# Patient Record
Sex: Female | Born: 1940
Health system: Southern US, Community
[De-identification: ages and names within clinical notes are randomized; demographics above are authoritative.]

## PROBLEM LIST (undated history)

## (undated) DIAGNOSIS — I10 Essential (primary) hypertension: Secondary | ICD-10-CM

## (undated) DIAGNOSIS — K219 Gastro-esophageal reflux disease without esophagitis: Secondary | ICD-10-CM

## (undated) DIAGNOSIS — E119 Type 2 diabetes mellitus without complications: Secondary | ICD-10-CM

## (undated) DIAGNOSIS — R413 Other amnesia: Secondary | ICD-10-CM

## (undated) DIAGNOSIS — D259 Leiomyoma of uterus, unspecified: Secondary | ICD-10-CM

## (undated) DIAGNOSIS — E559 Vitamin D deficiency, unspecified: Secondary | ICD-10-CM

## (undated) HISTORY — PX: TUBAL LIGATION: SHX77

## (undated) HISTORY — PX: HYSTEROSCOPY: SHX211

## (undated) HISTORY — DX: Vitamin D deficiency, unspecified: E55.9

## (undated) HISTORY — PX: CERVIX LESION DESTRUCTION: SHX591

## (undated) HISTORY — PX: DILATION AND CURETTAGE OF UTERUS: SHX78

## (undated) HISTORY — DX: Leiomyoma of uterus, unspecified: D25.9

---

## 1998-05-17 ENCOUNTER — Ambulatory Visit (HOSPITAL_COMMUNITY): Admission: RE | Admit: 1998-05-17 | Discharge: 1998-05-17 | Payer: Self-pay | Admitting: Internal Medicine

## 1998-08-29 ENCOUNTER — Ambulatory Visit (HOSPITAL_COMMUNITY): Admission: RE | Admit: 1998-08-29 | Discharge: 1998-08-29 | Payer: Self-pay | Admitting: Obstetrics and Gynecology

## 1999-03-30 ENCOUNTER — Ambulatory Visit (HOSPITAL_COMMUNITY): Admission: RE | Admit: 1999-03-30 | Discharge: 1999-03-30 | Payer: Self-pay | Admitting: Internal Medicine

## 1999-03-30 ENCOUNTER — Encounter: Payer: Self-pay | Admitting: Internal Medicine

## 1999-09-05 ENCOUNTER — Ambulatory Visit (HOSPITAL_COMMUNITY): Admission: RE | Admit: 1999-09-05 | Discharge: 1999-09-05 | Payer: Self-pay | Admitting: Obstetrics and Gynecology

## 1999-09-05 ENCOUNTER — Encounter: Payer: Self-pay | Admitting: Obstetrics and Gynecology

## 2000-03-28 ENCOUNTER — Other Ambulatory Visit: Admission: RE | Admit: 2000-03-28 | Discharge: 2000-03-28 | Payer: Self-pay | Admitting: Obstetrics and Gynecology

## 2000-09-06 ENCOUNTER — Ambulatory Visit (HOSPITAL_COMMUNITY): Admission: RE | Admit: 2000-09-06 | Discharge: 2000-09-06 | Payer: Self-pay | Admitting: Obstetrics and Gynecology

## 2000-09-06 ENCOUNTER — Encounter: Payer: Self-pay | Admitting: Obstetrics and Gynecology

## 2001-09-09 ENCOUNTER — Ambulatory Visit (HOSPITAL_COMMUNITY): Admission: RE | Admit: 2001-09-09 | Discharge: 2001-09-09 | Payer: Self-pay | Admitting: Obstetrics and Gynecology

## 2001-09-09 ENCOUNTER — Encounter: Payer: Self-pay | Admitting: Obstetrics and Gynecology

## 2002-04-20 ENCOUNTER — Other Ambulatory Visit: Admission: RE | Admit: 2002-04-20 | Discharge: 2002-04-20 | Payer: Self-pay | Admitting: Obstetrics and Gynecology

## 2003-05-12 ENCOUNTER — Other Ambulatory Visit: Admission: RE | Admit: 2003-05-12 | Discharge: 2003-05-12 | Payer: Self-pay | Admitting: Obstetrics and Gynecology

## 2003-11-20 HISTORY — PX: UTERINE FIBROID SURGERY: SHX826

## 2003-11-20 HISTORY — PX: COLONOSCOPY: SHX174

## 2004-01-14 ENCOUNTER — Ambulatory Visit (HOSPITAL_COMMUNITY): Admission: RE | Admit: 2004-01-14 | Discharge: 2004-01-14 | Payer: Self-pay | Admitting: Gastroenterology

## 2004-06-05 ENCOUNTER — Ambulatory Visit (HOSPITAL_COMMUNITY): Admission: RE | Admit: 2004-06-05 | Discharge: 2004-06-05 | Payer: Self-pay | Admitting: Obstetrics and Gynecology

## 2004-06-05 ENCOUNTER — Encounter (INDEPENDENT_AMBULATORY_CARE_PROVIDER_SITE_OTHER): Payer: Self-pay | Admitting: Specialist

## 2008-04-21 ENCOUNTER — Ambulatory Visit (HOSPITAL_COMMUNITY): Admission: RE | Admit: 2008-04-21 | Discharge: 2008-04-21 | Payer: Self-pay | Admitting: Internal Medicine

## 2008-11-19 HISTORY — PX: KNEE ARTHROCENTESIS: SUR44

## 2010-05-08 ENCOUNTER — Inpatient Hospital Stay (HOSPITAL_COMMUNITY): Admission: RE | Admit: 2010-05-08 | Discharge: 2010-05-11 | Payer: Self-pay | Admitting: Orthopedic Surgery

## 2011-02-04 LAB — CBC
HCT: 21.4 % — ABNORMAL LOW (ref 36.0–46.0)
HCT: 27.5 % — ABNORMAL LOW (ref 36.0–46.0)
HCT: 28.9 % — ABNORMAL LOW (ref 36.0–46.0)
Hemoglobin: 8.9 g/dL — ABNORMAL LOW (ref 12.0–15.0)
Hemoglobin: 9.5 g/dL — ABNORMAL LOW (ref 12.0–15.0)
MCHC: 32.6 g/dL (ref 30.0–36.0)
MCHC: 32.8 g/dL (ref 30.0–36.0)
MCHC: 32.9 g/dL (ref 30.0–36.0)
MCV: 93.5 fL (ref 78.0–100.0)
MCV: 93.6 fL (ref 78.0–100.0)
RBC: 2.29 MIL/uL — ABNORMAL LOW (ref 3.87–5.11)
RBC: 2.94 MIL/uL — ABNORMAL LOW (ref 3.87–5.11)
RBC: 3.08 MIL/uL — ABNORMAL LOW (ref 3.87–5.11)
RDW: 13.5 % (ref 11.5–15.5)
RDW: 13.9 % (ref 11.5–15.5)
WBC: 9.4 10*3/uL (ref 4.0–10.5)

## 2011-02-04 LAB — BASIC METABOLIC PANEL
CO2: 24 mEq/L (ref 19–32)
CO2: 28 mEq/L (ref 19–32)
Calcium: 8.6 mg/dL (ref 8.4–10.5)
Chloride: 106 mEq/L (ref 96–112)
Creatinine, Ser: 0.86 mg/dL (ref 0.4–1.2)
GFR calc Af Amer: >60 mL/min (ref >60)
GFR calc Af Amer: >60 mL/min (ref >60)
GFR calc non Af Amer: >60 mL/min (ref >60)
Glucose, Bld: 205 mg/dL — ABNORMAL HIGH (ref 70–99)
Potassium: 4.7 mEq/L (ref 3.5–5.1)
Potassium: 4.7 mEq/L (ref 3.5–5.1)
Sodium: 135 mEq/L (ref 135–145)

## 2011-02-04 LAB — TYPE AND SCREEN

## 2011-02-04 LAB — PROTIME-INR
INR: 1.19 (ref 0.00–1.49)
INR: 1.72 — ABNORMAL HIGH (ref 0.00–1.49)

## 2011-02-05 LAB — COMPREHENSIVE METABOLIC PANEL
Alkaline Phosphatase: 78 U/L (ref 39–117)
BUN: 13 mg/dL (ref 6–23)
Creatinine, Ser: 0.82 mg/dL (ref 0.4–1.2)
Glucose, Bld: 103 mg/dL — ABNORMAL HIGH (ref 70–99)
Potassium: 4 mEq/L (ref 3.5–5.1)
Total Protein: 7.5 g/dL (ref 6.0–8.3)

## 2011-02-05 LAB — URINALYSIS, ROUTINE W REFLEX MICROSCOPIC
Bilirubin Urine: NEGATIVE
Hgb urine dipstick: NEGATIVE
Nitrite: NEGATIVE
Protein, ur: NEGATIVE mg/dL
Urobilinogen, UA: 0.2 mg/dL (ref 0.0–1.0)

## 2011-02-05 LAB — SURGICAL PCR SCREEN: Staphylococcus aureus: POSITIVE — AB

## 2011-02-05 LAB — CBC
HCT: 36.9 % (ref 36.0–46.0)
Hemoglobin: 12.3 g/dL (ref 12.0–15.0)
MCHC: 33.4 g/dL (ref 30.0–36.0)
MCV: 93.8 fL (ref 78.0–100.0)
Platelets: 257 10*3/uL (ref 150–400)
RDW: 13.5 % (ref 11.5–15.5)

## 2011-02-05 LAB — PROTIME-INR
INR: 1.03 (ref 0.00–1.49)
Prothrombin Time: 13.4 seconds (ref 11.6–15.2)

## 2011-02-05 LAB — APTT: aPTT: 31 seconds (ref 24–37)

## 2011-04-06 NOTE — H&P (Signed)
NAME:  Martha Mata, Martha Mata                        ACCOUNT NO.:  [id number]   MEDICAL RECORD NO.:  [id number]                   PATIENT TYPE:  AMB   LOCATION:  SDC                                  FACILITY:  [facility]   PHYSICIAN:  Maxie Better, M.D.            DATE OF BIRTH:  1941-07-11   DATE OF ADMISSION:  06/05/2004  DATE OF DISCHARGE:                                HISTORY & PHYSICAL   CHIEF COMPLAINT:  Postmenopausal bleeding, thickened endometrium.   HISTORY OF PRESENT ILLNESS:  A 70 year old retired, widowed black female  with postmenopausal bleeding who was found to have a thickened endometrium  at an ultrasound who now presents with [facility] hysteroscopy.  The patient has  been noted to have a thickened endometrium since 1996.  Her last endometrial  biopsy was in 2001, at which time it was benign.  She was last evaluated  sonographically in 2001 when she presented vaginal bleeding.  Ultrasound on  May 24, 2004 showed an endometrial stripe of 1.27.  The endometrium was  cystic in hyperechoic areas with positive blood flow.  Multiple fibroids  were noted.  Ovaries were both normal.   The patient now presents for surgical evaluation.   ALLERGIES:  No known drug allergies.   MEDICATIONS:  Avalide.   PAST MEDICAL HISTORY:  Chronic hypertension.   PAST SURGICAL HISTORY:  Tubal ligation.   FAMILY HISTORY:  Mother with diabetes and hypertension.  No heart disease.  No colon or genital cancer.  Father died in a car accident.   OBSTETRICAL HISTORY:  Vaginal delivery x 2.  A widow, two children, and  nonsmoker.   REVIEW OF SYMPTOMS:  Negative.   PHYSICAL EXAMINATION:  GENERAL:  Well-developed, well-nourished black female  in no acute distress.  VITAL SIGNS:  Blood pressure 136/80, pulse of 72.  Weight of 197.5 pounds.  SKIN:  No lesions.  HEENT:  Anicteric sclerae.  Pink conjunctivae.  Oropharynx negative.  HEART:  Regular rate and rhythm without murmur.  LUNGS:  Clear to  auscultation.  BACK:  No CVA tenderness.  NECK:  Supple.  Thyroid not palpable.  No supraclavicular, axillary, or  inguinal nodes palpable.  ABDOMEN:  Obese, soft and nontender.  BREASTS:  Soft, nontender.  No palpable mass.  Right axillary extramammary  tissue.  EXTREMITIES:  No edema.  PELVIC:  Vulva showed no lesion.  Vaginal had light brown discharge.  Cervix  was pinpoint os.  Uterus was irregular, anteverted about eight weeks size  with a palpable anterior thyroid.  Adnexa:  No palpable but limited by the  patient's body habitus.  RECTAL:  Deferred.  The patient had a colonoscopy December of 2005.   IMPRESSION:  1. Postmenopausal bleeding.  2. Thickening endometrium.  3. Uterine fibroids.   PLAN:  D&C hysteroscopy and evaluation of the endometrium cavity,  preoperative labs per anesthesia.  Risk seizure was explained to the patient  including, but not  limited to, infection, bleeding, uterine perforation and  its management, fluid overload and its management and consequences, internal  scar tissue, injury to the surrounding organ structures, inability to dilate  the cervix completely due to her pinpoint os.                                               Maxie Better, M.D.    [place]/MEDQ  D:  06/04/2004  T:  06/04/2004  Job:  [id number]

## 2011-04-06 NOTE — Op Note (Signed)
NAME:  Martha Mata, Martha Mata                        ACCOUNT NO.:  [id number]   MEDICAL RECORD NO.:  [id number]                   PATIENT TYPE:  AMB   LOCATION:  SDC                                  FACILITY:  [facility]   PHYSICIAN:  Maxie Better, M.D.            DATE OF BIRTH:  Oct 20, 1941   DATE OF PROCEDURE:  06/05/2004  DATE OF DISCHARGE:                                 OPERATIVE REPORT   PREOPERATIVE DIAGNOSES:  1. Postmenopausal bleeding.  2. Endometrial thickness.   POSTOPERATIVE DIAGNOSES:  1. Postmenopausal bleeding.  2. Endometrial masses.   PROCEDURE:  Diagnostic hysteroscopy, removal of endometrial masses, dilation  and curettage.   ANESTHESIA:  General paracervical block.   SURGEON:  Maxie Better, M.D.   DESCRIPTION OF PROCEDURE:  Under adequate general anesthesia, the patient  was placed in the dorsal lithotomy position, she was sterilely prepped in  the usual fashion,  __________ small amount of urine.  Examination under  anesthesia revealed an irregular anteverted uterus about eight weeks size,  no adnexal masses could be appreciated. A bivalve speculum was placed within  the vagina, a single tooth tenaculum was placed on the anterior lip of the  cervix.  10 mL of 1% Nesacaine was injected paracervically, the cervix was  then serially dilated up to a #25 Pratt dilator.  A #5 mm diagnostic  hysteroscope was introduced into the uterine cavity. The left tubal ostia  could be seen. There was a polypoid lesion in the left lateral area of the  fundus. The right tubal ostia was sclerosed. There was an additional  polypoid lesion on the left anterior aspect of the uterine cavity and one on  the right distally to the other previous mass noted.  The cervix was  attempted to be further dilated up to a #31 dilator; however, the  resectoscope could not traverse the internal os and therefore polypoid  forceps and a small DeLee clamp was utilized to grasp the polypoid  lesions  and remove them with the diagnostic hysteroscope being reinserted to check  as to whether or not the lesions have been removed.  This was performed  until all polypoid lesions were removed at which time the cavity was then  curetted. There were small submucosal fibroids seen anteriorly and  posteriorly. These were not felt to be needed to be resected or removed as  the patient is postmenopausal. When all tissue was felt to have been removed  that was pedunculated, all instruments were then removed from the vagina.  The specimen labeled endometrial curetting, endometrial mass was sent to  pathology. Estimated blood loss was minimal. Complications none.  The  patient tolerated the procedure well and was transferred to the recovery  room in stable condition.  Maxie Better, M.D.    [place]/MEDQ  D:  06/05/2004  T:  06/06/2004  Job:  [id number]

## 2011-04-06 NOTE — Op Note (Signed)
NAME:  Martha Mata, Martha Mata                        ACCOUNT NO.:  [id number]   MEDICAL RECORD NO.:  [id number]                   PATIENT TYPE:  AMB   LOCATION:  ENDO                                 FACILITY:  [facility]   PHYSICIAN:  James L. Malon Kindle., M.D.          DATE OF BIRTH:  May 17, 1941   DATE OF PROCEDURE:  01/14/2004  DATE OF DISCHARGE:                                 OPERATIVE REPORT   PROCEDURE:  Colonoscopy.   MEDICATIONS:  Fentanyl 50 mcg, Versed 8 mg IV.   INDICATIONS:  Screening colonoscopy.   DESCRIPTION OF PROCEDURE:  The procedure had been explained to the patient  and consent obtained.  With the patient in the left lateral decubitus  position, the Olympus scope was inserted and advanced.  The prep was  excellent.  We were able to advance easily to the cecum.  The ileocecal  valve and appendiceal orifice were seen.  The  scope was withdrawn and the  cecum, ascending colon, transverse colon, descending and sigmoid colon were  seen well.  No polyps or other lesions were seen.  No significant  diverticular disease.  The rectum was free of polyps.  The scope was  withdrawn.  The patient tolerated the procedure well.  She was maintained on  low-flow oxygen and monitored throughout.   ASSESSMENT:  Normal screening colonoscopy.  V76.51.   PLAN:  Will recommend yearly hemoccults and repeat procedure in 10 years.                                               James L. Malon Kindle., M.D.    Waldron Session  D:  01/14/2004  T:  01/14/2004  Job:  [id number]   cc:   Margaretmary Bayley, M.D.  [address]  [place]  [place] [address]  Fax: [phone]   Maxie Better, M.D.  [address]  [place]  [place] [address]  Fax: [phone]

## 2011-09-10 ENCOUNTER — Emergency Department (HOSPITAL_COMMUNITY)
Admission: EM | Admit: 2011-09-10 | Discharge: 2011-09-10 | Disposition: A | Discharge status: Home / Self Care | Payer: Medicare Other | Attending: Emergency Medicine | Admitting: Emergency Medicine

## 2011-09-10 ENCOUNTER — Emergency Department (HOSPITAL_COMMUNITY): Payer: Medicare Other

## 2011-09-10 DIAGNOSIS — R51 Headache: Secondary | ICD-10-CM | POA: Insufficient documentation

## 2011-09-10 DIAGNOSIS — I10 Essential (primary) hypertension: Secondary | ICD-10-CM | POA: Insufficient documentation

## 2011-09-10 DIAGNOSIS — K8050 Calculus of bile duct without cholangitis or cholecystitis without obstruction: Secondary | ICD-10-CM | POA: Insufficient documentation

## 2011-09-10 DIAGNOSIS — R42 Dizziness and giddiness: Secondary | ICD-10-CM | POA: Insufficient documentation

## 2011-09-10 DIAGNOSIS — Z7982 Long term (current) use of aspirin: Secondary | ICD-10-CM | POA: Insufficient documentation

## 2011-09-10 DIAGNOSIS — R079 Chest pain, unspecified: Secondary | ICD-10-CM | POA: Insufficient documentation

## 2011-09-10 DIAGNOSIS — Z79899 Other long term (current) drug therapy: Secondary | ICD-10-CM | POA: Insufficient documentation

## 2011-09-10 DIAGNOSIS — R1013 Epigastric pain: Secondary | ICD-10-CM | POA: Insufficient documentation

## 2011-09-10 DIAGNOSIS — R911 Solitary pulmonary nodule: Secondary | ICD-10-CM | POA: Insufficient documentation

## 2011-09-10 DIAGNOSIS — Z96659 Presence of unspecified artificial knee joint: Secondary | ICD-10-CM | POA: Insufficient documentation

## 2011-09-10 LAB — COMPREHENSIVE METABOLIC PANEL
Albumin: 4.1 g/dL (ref 3.5–5.2)
BUN: 13 mg/dL (ref 6–23)
Creatinine, Ser: 0.78 mg/dL (ref 0.50–1.10)
Total Bilirubin: 0.3 mg/dL (ref 0.3–1.2)
Total Protein: 8.5 g/dL — ABNORMAL HIGH (ref 6.0–8.3)

## 2011-09-10 LAB — POCT I-STAT, CHEM 8
BUN: 14 mg/dL (ref 6–23)
Calcium, Ion: 1.19 mmol/L (ref 1.12–1.32)
Chloride: 104 mEq/L (ref 96–112)
Glucose, Bld: 152 mg/dL — ABNORMAL HIGH (ref 70–99)

## 2011-09-10 LAB — CBC
MCH: 30.4 pg (ref 26.0–34.0)
MCHC: 33.9 g/dL (ref 30.0–36.0)
MCV: 89.9 fL (ref 78.0–100.0)
Platelets: 288 10*3/uL (ref 150–400)
RDW: 13.2 % (ref 11.5–15.5)

## 2011-09-10 LAB — DIFFERENTIAL
Eosinophils Absolute: 0 10*3/uL (ref 0.0–0.7)
Eosinophils Relative: 0 % (ref 0–5)
Lymphs Abs: 1.1 10*3/uL (ref 0.7–4.0)
Monocytes Relative: 4 % (ref 3–12)

## 2011-09-10 LAB — TROPONIN I: Troponin I: <0.3 ng/mL (ref <0.30)

## 2011-09-10 LAB — LIPASE, BLOOD: Lipase: 31 U/L (ref 11–59)

## 2011-09-19 ENCOUNTER — Other Ambulatory Visit: Payer: Self-pay | Admitting: Internal Medicine

## 2011-09-19 DIAGNOSIS — R9389 Abnormal findings on diagnostic imaging of other specified body structures: Secondary | ICD-10-CM

## 2011-09-21 ENCOUNTER — Ambulatory Visit (HOSPITAL_COMMUNITY)
Admission: RE | Admit: 2011-09-21 | Discharge: 2011-09-21 | Disposition: A | Discharge status: Home / Self Care | Payer: Medicare Other | Source: Ambulatory Visit | Attending: Internal Medicine | Admitting: Internal Medicine

## 2011-09-21 DIAGNOSIS — R9389 Abnormal findings on diagnostic imaging of other specified body structures: Secondary | ICD-10-CM

## 2011-09-21 DIAGNOSIS — K449 Diaphragmatic hernia without obstruction or gangrene: Secondary | ICD-10-CM | POA: Insufficient documentation

## 2011-09-21 DIAGNOSIS — R911 Solitary pulmonary nodule: Secondary | ICD-10-CM | POA: Insufficient documentation

## 2011-09-21 MED ORDER — IOHEXOL 300 MG/ML  SOLN
80.0000 mL | Freq: Once | INTRAMUSCULAR | Status: AC | PRN
  Administered 2011-09-21: 80 mL via INTRAVENOUS

## 2013-05-21 ENCOUNTER — Other Ambulatory Visit: Payer: Self-pay | Admitting: Internal Medicine

## 2013-05-21 ENCOUNTER — Ambulatory Visit (HOSPITAL_COMMUNITY)
Admission: RE | Admit: 2013-05-21 | Discharge: 2013-05-21 | Disposition: A | Discharge status: Home / Self Care | Payer: Medicare Other | Source: Ambulatory Visit | Attending: Internal Medicine | Admitting: Internal Medicine

## 2013-05-21 DIAGNOSIS — R52 Pain, unspecified: Secondary | ICD-10-CM

## 2013-05-21 DIAGNOSIS — M25519 Pain in unspecified shoulder: Secondary | ICD-10-CM | POA: Insufficient documentation

## 2013-06-26 ENCOUNTER — Emergency Department (HOSPITAL_COMMUNITY): Payer: Medicare Other

## 2013-06-26 ENCOUNTER — Emergency Department (HOSPITAL_COMMUNITY)
Admission: EM | Admit: 2013-06-26 | Discharge: 2013-06-26 | Disposition: A | Discharge status: Home / Self Care | Payer: Medicare Other | Attending: Emergency Medicine | Admitting: Emergency Medicine

## 2013-06-26 ENCOUNTER — Encounter (HOSPITAL_COMMUNITY): Payer: Self-pay | Admitting: Emergency Medicine

## 2013-06-26 DIAGNOSIS — N939 Abnormal uterine and vaginal bleeding, unspecified: Secondary | ICD-10-CM

## 2013-06-26 DIAGNOSIS — N898 Other specified noninflammatory disorders of vagina: Secondary | ICD-10-CM | POA: Insufficient documentation

## 2013-06-26 DIAGNOSIS — I10 Essential (primary) hypertension: Secondary | ICD-10-CM | POA: Insufficient documentation

## 2013-06-26 DIAGNOSIS — M545 Low back pain, unspecified: Secondary | ICD-10-CM | POA: Insufficient documentation

## 2013-06-26 DIAGNOSIS — Z79899 Other long term (current) drug therapy: Secondary | ICD-10-CM | POA: Insufficient documentation

## 2013-06-26 DIAGNOSIS — D259 Leiomyoma of uterus, unspecified: Secondary | ICD-10-CM

## 2013-06-26 HISTORY — DX: Essential (primary) hypertension: I10

## 2013-06-26 LAB — COMPREHENSIVE METABOLIC PANEL
AST: 14 U/L (ref 0–37)
Albumin: 3.8 g/dL (ref 3.5–5.2)
Alkaline Phosphatase: 70 U/L (ref 39–117)
CO2: 28 mEq/L (ref 19–32)
Chloride: 101 mEq/L (ref 96–112)
GFR calc non Af Amer: 64 mL/min — ABNORMAL LOW (ref >90)
Potassium: 4 mEq/L (ref 3.5–5.1)
Total Bilirubin: 0.4 mg/dL (ref 0.3–1.2)

## 2013-06-26 LAB — URINALYSIS, ROUTINE W REFLEX MICROSCOPIC
Bilirubin Urine: NEGATIVE
Glucose, UA: NEGATIVE mg/dL
Hgb urine dipstick: NEGATIVE
Ketones, ur: 15 mg/dL — AB
Ketones, ur: NEGATIVE mg/dL
Nitrite: POSITIVE — AB
Protein, ur: NEGATIVE mg/dL
Specific Gravity, Urine: 1.009 (ref 1.005–1.030)
Urobilinogen, UA: 0.2 mg/dL (ref 0.0–1.0)
pH: 7 (ref 5.0–8.0)

## 2013-06-26 LAB — CBC WITH DIFFERENTIAL/PLATELET
Basophils Absolute: 0 10*3/uL (ref 0.0–0.1)
Basophils Relative: 0 % (ref 0–1)
HCT: 37.5 % (ref 36.0–46.0)
Hemoglobin: 12.5 g/dL (ref 12.0–15.0)
Lymphocytes Relative: 28 % (ref 12–46)
MCHC: 33.3 g/dL (ref 30.0–36.0)
Monocytes Absolute: 0.5 10*3/uL (ref 0.1–1.0)
Monocytes Relative: 9 % (ref 3–12)
Neutro Abs: 3.5 10*3/uL (ref 1.7–7.7)
Neutrophils Relative %: 62 % (ref 43–77)
RDW: 14 % (ref 11.5–15.5)
WBC: 5.6 10*3/uL (ref 4.0–10.5)

## 2013-06-26 LAB — URINE MICROSCOPIC-ADD ON

## 2013-06-26 MED ORDER — IOHEXOL 300 MG/ML  SOLN
100.0000 mL | Freq: Once | INTRAMUSCULAR | Status: AC | PRN
  Administered 2013-06-26: 100 mL via INTRAVENOUS

## 2013-06-26 MED ORDER — IOHEXOL 300 MG/ML  SOLN
50.0000 mL | Freq: Once | INTRAMUSCULAR | Status: AC | PRN
  Administered 2013-06-26: 50 mL via ORAL

## 2013-06-26 NOTE — ED Provider Notes (Signed)
Medical screening examination/treatment/procedure(s) were conducted as a shared visit with non-physician practitioner(s) and myself.  I personally evaluated the patient during the encounter  Martha Mata is a 72 y.o. female here with vaginal bleeding. Vaginal bleeding since yesterday. Post menopausal. Mild LLQ tenderness on exam. Vaginal exam as per PA's note. CT showed fibroids. CBC stable. She has gyn f/u next week. I told her to take motrin prn. If the bleeding is worse she may need a hysterectomy.    Richardean Canal, MD 06/26/13 2223

## 2013-06-26 NOTE — Progress Notes (Signed)
Patient states her pcp is Dr. Chestine Spore.

## 2013-06-26 NOTE — ED Notes (Signed)
This am pt began having lt flank pain with some vaginal bleeding. Denies any pain. Alert x4,

## 2013-06-26 NOTE — ED Provider Notes (Signed)
CSN: [id number]     Arrival date & time 06/26/13  1605 History     First MD Initiated Contact with Patient 06/26/13 1706     Chief Complaint  Patient presents with  . Vaginal Bleeding   (Consider location/radiation/quality/duration/timing/severity/associated sxs/prior Treatment) HPI Comments: 72 year old female past medical history of hypertension presents emergency department complaining of vaginal bleeding beginning this morning. States the blood is not clots, however is bleeding enough to require her to wear pads. She has not had any vaginal bleeding for "many many years". She still has her uterus and ovaries. Denies vaginal pain, pelvic pain, flank pain. Denies increased urinary frequency, urgency or dysuria. No appetite change or unexpected weight change. She does not take any blood thinners. She does have some mild left-sided low back pain which has been present for the past week, however states she's been doing a lot around the house and relates it to that.  Patient is a 72 y.o. female presenting with vaginal bleeding. The history is provided by the patient.  Vaginal Bleeding Associated symptoms: back pain   Associated symptoms: no abdominal pain, no dysuria, no fever, no nausea and no vaginal discharge     Past Medical History  Diagnosis Date  . Hypertension    Past Surgical History  Procedure Laterality Date  . Knee arthrocentesis     No family history on file. History  Substance Use Topics  . Smoking status: Not on file  . Smokeless tobacco: Not on file  . Alcohol Use: Not on file   OB History   Grav Para Term Preterm Abortions TAB SAB Ect Mult Living                 Review of Systems  Constitutional: Negative for fever, chills, appetite change and unexpected weight change.  Respiratory: Negative for shortness of breath.   Cardiovascular: Negative for chest pain.  Gastrointestinal: Negative for nausea, vomiting and abdominal pain.  Genitourinary: Positive for  vaginal bleeding. Negative for dysuria, urgency, frequency, hematuria, flank pain, decreased urine volume, vaginal discharge, difficulty urinating, vaginal pain and pelvic pain.  Musculoskeletal: Positive for back pain.  Neurological: Negative for weakness.  All other systems reviewed and are negative.    Allergies  Review of patient's allergies indicates no known allergies.  Home Medications   Current Outpatient Rx  Name  Route  Sig  Dispense  Refill  . amLODipine (NORVASC) 10 MG tablet   Oral   Take 10 mg by mouth daily.         . benazepril (LOTENSIN) 20 MG tablet   Oral   Take 20 mg by mouth daily.         . cholecalciferol (VITAMIN D) 1000 UNITS tablet   Oral   Take 1,000 Units by mouth daily.         . vitamin C (ASCORBIC ACID) 500 MG tablet   Oral   Take 500 mg by mouth daily.         . vitamin E 400 UNIT capsule   Oral   Take 400 Units by mouth daily.          BP 161/74  Pulse 78  Temp(Src) 97.3 F (36.3 C) (Oral)  Resp 18  SpO2 99% Physical Exam  Nursing note and vitals reviewed. Constitutional: She is oriented to person, place, and time. She appears well-developed and well-nourished. No distress.  HENT:  Head: Normocephalic and atraumatic.  Mouth/Throat: Oropharynx is clear and moist.  Eyes: Conjunctivae are  normal.  Neck: Normal range of motion. Neck supple.  Cardiovascular: Normal rate, regular rhythm and normal heart sounds.   Pulmonary/Chest: Effort normal and breath sounds normal.  Abdominal: Soft. Normal appearance and bowel sounds are normal. She exhibits no distension and no mass. There is tenderness (deep palpation) in the left lower quadrant. There is no rigidity, no rebound, no guarding and no CVA tenderness.    No peritoneal signs.  Genitourinary: Uterus is not tender. Right adnexum displays no mass, no tenderness and no fullness. Left adnexum displays tenderness. Left adnexum displays no mass and no fullness. There is bleeding  (large amount of dark red blood) around the vagina. No tenderness around the vagina.  Musculoskeletal: Normal range of motion. She exhibits no edema.  Neurological: She is alert and oriented to person, place, and time.  Skin: Skin is warm and dry. She is not diaphoretic.  Psychiatric: She has a normal mood and affect. Her behavior is normal.    ED Course   Procedures (including critical care time)  Labs Reviewed  URINALYSIS, ROUTINE W REFLEX MICROSCOPIC - Abnormal; Notable for the following:    Color, Urine RED (*)    APPearance TURBID (*)    Hgb urine dipstick LARGE (*)    Bilirubin Urine MODERATE (*)    Ketones, ur 15 (*)    Protein, ur 100 (*)    Nitrite POSITIVE (*)    Leukocytes, UA LARGE (*)    All other components within normal limits  COMPREHENSIVE METABOLIC PANEL - Abnormal; Notable for the following:    GFR calc non Af Amer 64 (*)    GFR calc Af Amer 74 (*)    All other components within normal limits  URINE MICROSCOPIC-ADD ON - Abnormal; Notable for the following:    Bacteria, UA MANY (*)    All other components within normal limits  URINALYSIS, ROUTINE W REFLEX MICROSCOPIC - Abnormal; Notable for the following:    Leukocytes, UA TRACE (*)    All other components within normal limits  URINE CULTURE  CBC WITH DIFFERENTIAL  URINE MICROSCOPIC-ADD ON   Ct Abdomen Pelvis W Contrast  06/26/2013   *RADIOLOGY REPORT*  Clinical Data: Left flank pain and vaginal bleeding.  CT ABDOMEN AND PELVIS WITH CONTRAST  Technique:  Multidetector CT imaging of the abdomen and pelvis was performed following the standard protocol during bolus administration of intravenous contrast.  Contrast: OMNIPAQUE IOHEXOL 300 MG/ML  SOLN, 50mL OMNIPAQUE IOHEXOL 300 MG/ML  SOLN  Comparison: None.  Findings: Diffuse hepatic steatosis. Relative sparing adjacent to the gallbladder in the medial segment of the left lobe.  2.7 cm gallstone.  Large hiatal hernia.  Pancreas, spleen, adrenal glands are  within normal limits.  Mild chronic changes of the kidneys.  Tiny hypodensity in the upper pole of the left kidney is nonspecific.  The uterus is lobulated most likely due to multiple uterine fibroids.  Adnexa are unremarkable.  Bladder is within normal limits.  No free fluid.  No retroperitoneal adenopathy.  Advanced lumbar facet arthropathy.  An element of spinal stenosis is suspected in the lumbar spine.  IMPRESSION: Cholelithiasis.  Diffuse hepatic steatosis.  Uterine fibroids.   Original Report Authenticated By: Jolaine Click, M.D.   1. Vaginal bleeding   2. Abnormal uterine bleeding   3. Uterine fibroid     MDM  Patient with vaginal bleeding x1 day. No other significant symptoms present. Tenderness in left lower cushion on abdominal exam to deep palpation. Concern for possible  malignancy. Labs pending- CBC, CMP and urine. We'll obtain CT scan of abdomen and pelvis. Patient discussed with Dr. Silverio Lay who agrees with plan of care. 8:47 PM CT scan showing uterine fibroids. Labs unremarkable. Initial urine sample contaminated, repeat obtained through in and out cath and normal. She has f/u appt with her GYN Dr. Cherly Hensen on Wed. She is stable for discharge. Close return precautions discussed. Patient states understanding of treatment care plan and is agreeable. Patient also evaluated by Dr. Silverio Lay who agrees with plan of care.  Trevor Mace, PA-C 06/26/13 2049

## 2013-06-27 LAB — URINE CULTURE: Colony Count: 45000

## 2013-07-16 ENCOUNTER — Other Ambulatory Visit: Payer: Self-pay | Admitting: Obstetrics and Gynecology

## 2013-07-17 ENCOUNTER — Encounter (HOSPITAL_COMMUNITY): Payer: Self-pay

## 2013-07-17 ENCOUNTER — Encounter (HOSPITAL_COMMUNITY): Payer: Self-pay | Admitting: Pharmacy Technician

## 2013-07-17 ENCOUNTER — Encounter (HOSPITAL_COMMUNITY)
Admission: RE | Admit: 2013-07-17 | Discharge: 2013-07-17 | Disposition: A | Discharge status: Home / Self Care | Payer: Medicare Other | Source: Ambulatory Visit | Attending: Obstetrics and Gynecology | Admitting: Obstetrics and Gynecology

## 2013-07-17 DIAGNOSIS — Z01810 Encounter for preprocedural cardiovascular examination: Secondary | ICD-10-CM | POA: Insufficient documentation

## 2013-07-17 DIAGNOSIS — Z01812 Encounter for preprocedural laboratory examination: Secondary | ICD-10-CM | POA: Insufficient documentation

## 2013-07-17 DIAGNOSIS — Z01818 Encounter for other preprocedural examination: Secondary | ICD-10-CM | POA: Insufficient documentation

## 2013-07-17 HISTORY — DX: Gastro-esophageal reflux disease without esophagitis: K21.9

## 2013-07-17 HISTORY — DX: Other amnesia: R41.3

## 2013-07-17 LAB — CBC
HCT: 35.9 % — ABNORMAL LOW (ref 36.0–46.0)
MCV: 92.8 fL (ref 78.0–100.0)
RDW: 13.7 % (ref 11.5–15.5)
WBC: 5 10*3/uL (ref 4.0–10.5)

## 2013-07-17 LAB — BASIC METABOLIC PANEL
BUN: 14 mg/dL (ref 6–23)
Chloride: 104 mEq/L (ref 96–112)
Creatinine, Ser: 0.9 mg/dL (ref 0.50–1.10)
GFR calc Af Amer: 72 mL/min — ABNORMAL LOW (ref >90)
Glucose, Bld: 100 mg/dL — ABNORMAL HIGH (ref 70–99)

## 2013-07-17 NOTE — Patient Instructions (Addendum)
Your procedure is scheduled on:07/22/13  Enter through the Main Entrance at :6am Pick up desk phone and dial [phone] and inform us of your arrival.  Please call [phone] if you have any problems the morning of surgery.  Remember: Do not eat food or drink liquids, including water, after midnight:TUESDAY   You may brush your teeth the morning of surgery.  Take these meds the morning of surgery with a sip of water:blood pressure pills  DO NOT wear jewelry, eye make-up, lipstick,body lotion, or dark fingernail polish.  (Polished toes are ok) You may wear deodorant.   Patients discharged on the day of surgery will not be allowed to drive home. Wear loose fitting, comfortable clothes for your ride home.

## 2013-07-22 ENCOUNTER — Encounter (HOSPITAL_COMMUNITY): Payer: Self-pay | Admitting: Anesthesiology

## 2013-07-22 ENCOUNTER — Ambulatory Visit (HOSPITAL_COMMUNITY)
Admission: RE | Admit: 2013-07-22 | Discharge: 2013-07-22 | Disposition: A | Discharge status: Home / Self Care | Payer: Medicare Other | Source: Ambulatory Visit | Attending: Obstetrics and Gynecology | Admitting: Obstetrics and Gynecology

## 2013-07-22 ENCOUNTER — Encounter (HOSPITAL_COMMUNITY): Payer: Self-pay | Admitting: *Deleted

## 2013-07-22 ENCOUNTER — Encounter (HOSPITAL_COMMUNITY)
Admission: RE | Disposition: A | Discharge status: Home / Self Care | Payer: Self-pay | Source: Ambulatory Visit | Attending: Obstetrics and Gynecology

## 2013-07-22 DIAGNOSIS — N840 Polyp of corpus uteri: Secondary | ICD-10-CM | POA: Insufficient documentation

## 2013-07-22 DIAGNOSIS — Z5309 Procedure and treatment not carried out because of other contraindication: Secondary | ICD-10-CM | POA: Insufficient documentation

## 2013-07-22 DIAGNOSIS — N950 Postmenopausal bleeding: Secondary | ICD-10-CM | POA: Insufficient documentation

## 2013-07-22 SURGERY — DILATATION & CURETTAGE/HYSTEROSCOPY WITH RESECTOCOPE
Anesthesia: Choice

## 2013-07-22 MED ORDER — LIDOCAINE HCL (CARDIAC) 20 MG/ML IV SOLN
INTRAVENOUS | Status: AC
  Filled 2013-07-22: qty 5

## 2013-07-22 MED ORDER — FENTANYL CITRATE 0.05 MG/ML IJ SOLN
INTRAMUSCULAR | Status: AC
  Filled 2013-07-22: qty 2

## 2013-07-22 MED ORDER — CHLOROPROCAINE HCL 1 % IJ SOLN
INTRAMUSCULAR | Status: AC
  Filled 2013-07-22: qty 30

## 2013-07-22 MED ORDER — MIDAZOLAM HCL 2 MG/2ML IJ SOLN
INTRAMUSCULAR | Status: AC
  Filled 2013-07-22: qty 2

## 2013-07-22 MED ORDER — LACTATED RINGERS IV SOLN
INTRAVENOUS | Status: DC
  Administered 2013-07-22: 07:00:00 via INTRAVENOUS

## 2013-07-22 MED ORDER — PROPOFOL 10 MG/ML IV EMUL
INTRAVENOUS | Status: AC
  Filled 2013-07-22: qty 20

## 2013-07-22 MED ORDER — ONDANSETRON HCL 4 MG/2ML IJ SOLN
INTRAMUSCULAR | Status: AC
  Filled 2013-07-22: qty 2

## 2013-07-22 SURGICAL SUPPLY — 18 items
CANISTER SUCTION 2500CC (MISCELLANEOUS) ×2 IMPLANT
CATH ROBINSON RED A/P 16FR (CATHETERS) ×2 IMPLANT
CLOTH BEACON ORANGE TIMEOUT ST (SAFETY) ×2 IMPLANT
CONTAINER PREFILL 10% NBF 60ML (FORM) ×4 IMPLANT
DRESSING TELFA 8X3 (GAUZE/BANDAGES/DRESSINGS) ×2 IMPLANT
ELECT REM PT RETURN 9FT ADLT (ELECTROSURGICAL) ×2
ELECTRODE REM PT RTRN 9FT ADLT (ELECTROSURGICAL) ×1 IMPLANT
ELECTRODE ROLLER VERSAPOINT (ELECTRODE) IMPLANT
ELECTRODE RT ANGLE VERSAPOINT (CUTTING LOOP) IMPLANT
GLOVE BIO SURGEON STRL SZ 6.5 (GLOVE) ×2 IMPLANT
GLOVE BIOGEL PI IND STRL 7.0 (GLOVE) ×1 IMPLANT
GLOVE BIOGEL PI INDICATOR 7.0 (GLOVE) ×1
GOWN STRL REIN XL XLG (GOWN DISPOSABLE) ×4 IMPLANT
LOOP ANGLED CUTTING 22FR (CUTTING LOOP) IMPLANT
PACK HYSTEROSCOPY LF (CUSTOM PROCEDURE TRAY) ×2 IMPLANT
PAD OB MATERNITY 4.3X12.25 (PERSONAL CARE ITEMS) ×2 IMPLANT
TOWEL OR 17X24 6PK STRL BLUE (TOWEL DISPOSABLE) ×4 IMPLANT
WATER STERILE IRR 1000ML POUR (IV SOLUTION) ×2 IMPLANT

## 2013-07-22 NOTE — H&P (Signed)
History     No chief complaint on file. cc; PMB 72 yo g2P2 widowed black female presents for surgical mgmt of PMB. Pt underwent sonogram that showed thickened endometrium. sono hysterogram showed 2 small polypoid lesions. Pt is not on HRT  OB History   Grav Para Term Preterm Abortions TAB SAB Ect Mult Living                  Past Medical History  Diagnosis Date  . Hypertension   . Memory loss of unknown cause     per pt's admission  . GERD (gastroesophageal reflux disease)     no meds    Past Surgical History  Procedure Laterality Date  . Knee arthrocentesis      No family history on file.  History  Substance Use Topics  . Smoking status: Never Smoker   . Smokeless tobacco: Not on file  . Alcohol Use: Yes     Comment: occasionally    Allergies: No Known Allergies  No prescriptions prior to admission     Physical Exam   There were no vitals taken for this visit.   General appearance:  WD WN BF Cor RRR Lungs: clear to auscultation bilaterally Breasts: normal appearance, no masses or tenderness Abd soft nontender Pelvic; nl vulva Atrophic vagina.  Sl nodular uterus Adnexa no palp mass ED Course  PMB  P) dx hysteroscopy, D&C resection of endom polyps. Risk of surgery reviewed with pt: infection, bleeding, uterine perforation and its risk, injury to surrounding organ structures.  MDM   Serita Kyle, MD 4:47 AM 07/22/2013

## 2013-07-22 NOTE — Progress Notes (Signed)
Pt had orange juice.. Dr.  Malen Gauze and Dr. Cherly Hensen aware. Pt surgery cancelled.

## 2013-08-10 ENCOUNTER — Encounter: Payer: Self-pay | Admitting: Neurology

## 2013-08-11 ENCOUNTER — Encounter: Payer: Self-pay | Admitting: Neurology

## 2013-08-11 ENCOUNTER — Ambulatory Visit (INDEPENDENT_AMBULATORY_CARE_PROVIDER_SITE_OTHER): Payer: Medicare Other | Admitting: Neurology

## 2013-08-11 VITALS — BP 141/79 | HR 76 | Ht 65.5 in | Wt 205.0 lb

## 2013-08-11 DIAGNOSIS — F09 Unspecified mental disorder due to known physiological condition: Secondary | ICD-10-CM

## 2013-08-11 DIAGNOSIS — R4189 Other symptoms and signs involving cognitive functions and awareness: Secondary | ICD-10-CM

## 2013-08-11 NOTE — Patient Instructions (Signed)
Overall you are doing fairly well but I do want to suggest a few things today:   As far as diagnostic testing:  1) We are checking some blood work today, please complete this today before leaving 2) We ordered a MRI of the brain  I would like to see you back in 1 to 2 months, sooner if we need to. Please call us with any interim questions, concerns, problems, updates or refill requests.   Please also call us for any test results so we can go over those with you on the phone.  My clinical assistant and will answer any of your questions and relay your messages to me and also relay most of my messages to you.   Our phone number is [phone]. We also have an after hours call service for urgent matters and there is a physician on-call for urgent questions. For any emergencies you know to call 911 or go to the nearest emergency room

## 2013-08-11 NOTE — Progress Notes (Addendum)
[facility]  Provider:  Dr Hosie Poisson Referring Provider: Laurena Slimmer, MD Primary Care Physician:  Laurena Slimmer, MD  CC:  Memory loss  HPI:  Martha Mata is a 72 y.o. female here as a referral from Dr. Chestine Spore for memory loss  First started noticing this around 6 months ago. Predominately trouble with short term memory, trouble finding things calm her memory which is able to do that today. Per her son the left conversations and 30 minutes later she will repeat the same conversation. Gets confused less intense on TV, thinks she is only seen her before. She currently lives alone, continues to manage her finances and feels she is doing fine with this. No missed pills. Family notices her house is getting a little more clot or anxious collecting things. She does continue to drive. Has not gotten lost or had any accidents. Sleeping well, no hallucinations. Remote memory is good. No strokes or TIAs, no diabetes. Has hypertension. Has light alcohol intake no tobacco use.  Patients mother had dementia.   Review of Systems: Out of a complete 14 system review, the patient complains of only the following symptoms, and all other reviewed systems are negative. Positive for snoring feeling cold aching muscles memory loss   History   Social History  . Marital Status: Widowed    Spouse Name: N/A    Number of Children: 2  . Years of Education: 12   Occupational History  . retired    Social History Main Topics  . Smoking status: Never Smoker   . Smokeless tobacco: Never Used  . Alcohol Use: Yes     Comment: occasionally,one glass of wine monthly  . Drug Use: No  . Sexual Activity: Not on file   Other Topics Concern  . Not on file   Social History Narrative   Patient is retired.    Patient is a non-smoker    Patient lives at home.   Patient consumes caffeine once monthly    No family history on file.  Past Medical History  Diagnosis Date  . Hypertension   .  Memory loss of unknown cause     per pt's admission  . GERD (gastroesophageal reflux disease)     no meds  . Vitamin D deficiency disease   . Fibroid, uterine     Past Surgical History  Procedure Laterality Date  . Knee arthrocentesis  2010  . Cervix lesion destruction    . Hysteroscopy    . Dilation and curettage of uterus    . Tubal ligation    . Colonoscopy  2005  . Uterine fibroid surgery  2005    Current Outpatient Prescriptions  Medication Sig Dispense Refill  . amLODipine (NORVASC) 10 MG tablet Take 10 mg by mouth daily.      . benazepril (LOTENSIN) 20 MG tablet Take 20 mg by mouth daily.      . cholecalciferol (VITAMIN D) 1000 UNITS tablet Take 1,000 Units by mouth daily.      . misoprostol (CYTOTEC) 200 MCG tablet Take 200 mcg by mouth once.      . vitamin B-12 (CYANOCOBALAMIN) 500 MCG tablet Take 500 mcg by mouth daily.      . vitamin C (ASCORBIC ACID) 500 MG tablet Take 500 mg by mouth daily.      . vitamin E 400 UNIT capsule Take 400 Units by mouth daily.       No current facility-administered medications for this visit.  Allergies as of 08/11/2013  . (No Known Allergies)    Vitals: BP 141/79  Pulse 76  Ht 5' 5.5" (1.664 m)  Wt 205 lb (92.987 kg)  BMI 33.58 kg/m2 Last Weight:  Wt Readings from Last 1 Encounters:  08/11/13 205 lb (92.987 kg)   Last Height:   Ht Readings from Last 1 Encounters:  08/11/13 5' 5.5" (1.664 m)     Physical exam: Exam: Gen: NAD, conversant Eyes: anicteric sclerae, moist conjunctivae HENT: Atraumatic Neck: Trachea midline; supple,  Lungs: CTA, no wheezing, rales, rhonic                          CV: RRR, no MRG Abdomen: Soft, non-tender;  Extremities: No peripheral edema  Skin: Normal temperature, no rash,  Psych: Appropriate affect, pleasant  Neuro: MS: alert, pleasant MOCA 10/30  CN: PERRL, EOMI no nystagmus, no ptosis, sensation intact to LT V1-V3 bilat, face symmetric, no weakness, hearing grossly intact,  palate elevates symmetrically, shoulder shrug 5/5 bilat,  tongue protrudes midline, no fasiculations noted.  Motor: normal bulk and tone Strength: 5/5  In all extremities  Coord: rapid alternating and point-to-point (FNF, HTS) movements intact.  Reflexes: symmetrical, bilat downgoing toes  Sens: LT intact in all extremities  Gait: posture, stance, stride and arm-swing normal. Romberg absent.   Assessment:  After physical and neurologic examination, review of laboratory studies, imaging, neurophysiology testing and pre-existing records, assessment will be reviewed on the problem list.  Plan:  Treatment plan and additional workup will be reviewed under Problem List.  Ms Martha Mata is a pleasant 71y/o woman presenting for initial evaluation of cognitive decline. Began noticing symptoms around 6 months ago. Predominately trouble with short term memory, confusion and forgetfulness. Remote memory remains intact. Physical exam unremarkable except for MOCA score of 10/30. Patient does have a family history of dementia. Unclear etiology, will do lab workup and imaging workup for potential reversible causes. Can consider use of Exelon or Aricept in the future.  1)Cognitive decline  -lab workup -MRI brain -pending results would consider Aricept or Exelon in the future -follow up in 1 to 2 months  -follow up with clinical research prior to next appointment for possible inclusion in clinical trial

## 2013-08-14 ENCOUNTER — Ambulatory Visit (HOSPITAL_COMMUNITY)
Admission: RE | Admit: 2013-08-14 | Discharge: 2013-08-14 | Disposition: A | Discharge status: Home / Self Care | Payer: Medicare Other | Source: Ambulatory Visit | Attending: Obstetrics and Gynecology | Admitting: Obstetrics and Gynecology

## 2013-08-14 ENCOUNTER — Encounter (HOSPITAL_COMMUNITY)
Admission: RE | Disposition: A | Discharge status: Home / Self Care | Payer: Self-pay | Source: Ambulatory Visit | Attending: Obstetrics and Gynecology

## 2013-08-14 ENCOUNTER — Encounter (HOSPITAL_COMMUNITY): Payer: Self-pay | Admitting: Anesthesiology

## 2013-08-14 ENCOUNTER — Ambulatory Visit (HOSPITAL_COMMUNITY): Payer: Medicare Other | Admitting: Anesthesiology

## 2013-08-14 ENCOUNTER — Other Ambulatory Visit: Payer: Self-pay | Admitting: Obstetrics and Gynecology

## 2013-08-14 ENCOUNTER — Encounter (HOSPITAL_COMMUNITY): Payer: Self-pay | Admitting: *Deleted

## 2013-08-14 DIAGNOSIS — D250 Submucous leiomyoma of uterus: Secondary | ICD-10-CM

## 2013-08-14 DIAGNOSIS — N950 Postmenopausal bleeding: Secondary | ICD-10-CM | POA: Insufficient documentation

## 2013-08-14 DIAGNOSIS — I10 Essential (primary) hypertension: Secondary | ICD-10-CM | POA: Insufficient documentation

## 2013-08-14 DIAGNOSIS — N840 Polyp of corpus uteri: Secondary | ICD-10-CM | POA: Insufficient documentation

## 2013-08-14 HISTORY — PX: DILATATION & CURRETTAGE/HYSTEROSCOPY WITH RESECTOCOPE: SHX5572

## 2013-08-14 SURGERY — DILATATION & CURETTAGE/HYSTEROSCOPY WITH RESECTOCOPE
Anesthesia: General | Site: Uterus | Wound class: Clean Contaminated

## 2013-08-14 MED ORDER — FENTANYL CITRATE 0.05 MG/ML IJ SOLN
INTRAMUSCULAR | Status: AC
  Filled 2013-08-14: qty 5

## 2013-08-14 MED ORDER — PROPOFOL 10 MG/ML IV BOLUS
INTRAVENOUS | Status: DC | PRN
  Administered 2013-08-14: 140 mg via INTRAVENOUS

## 2013-08-14 MED ORDER — MIDAZOLAM HCL 2 MG/2ML IJ SOLN
INTRAMUSCULAR | Status: AC
  Filled 2013-08-14: qty 2

## 2013-08-14 MED ORDER — GLYCINE 1.5 % IR SOLN
Status: DC | PRN
  Administered 2013-08-14: 3000 mL

## 2013-08-14 MED ORDER — ONDANSETRON HCL 4 MG/2ML IJ SOLN: 4.0000 mg | Freq: Once | INTRAMUSCULAR | Status: DC | PRN

## 2013-08-14 MED ORDER — FENTANYL CITRATE 0.05 MG/ML IJ SOLN: 25.0000 ug | INTRAMUSCULAR | Status: DC | PRN

## 2013-08-14 MED ORDER — FENTANYL CITRATE 0.05 MG/ML IJ SOLN
INTRAMUSCULAR | Status: DC | PRN
  Administered 2013-08-14 (×2): 50 ug via INTRAVENOUS

## 2013-08-14 MED ORDER — LIDOCAINE HCL (CARDIAC) 20 MG/ML IV SOLN
INTRAVENOUS | Status: DC | PRN
  Administered 2013-08-14: 60 mg via INTRAVENOUS

## 2013-08-14 MED ORDER — LACTATED RINGERS IV SOLN
INTRAVENOUS | Status: DC
  Administered 2013-08-14 (×2): via INTRAVENOUS

## 2013-08-14 MED ORDER — CHLOROPROCAINE HCL 1 % IJ SOLN
INTRAMUSCULAR | Status: AC
  Filled 2013-08-14: qty 30

## 2013-08-14 MED ORDER — ONDANSETRON HCL 4 MG/2ML IJ SOLN
INTRAMUSCULAR | Status: AC
  Filled 2013-08-14: qty 2

## 2013-08-14 MED ORDER — LIDOCAINE HCL (CARDIAC) 20 MG/ML IV SOLN
INTRAVENOUS | Status: AC
  Filled 2013-08-14: qty 5

## 2013-08-14 MED ORDER — MEPERIDINE HCL 25 MG/ML IJ SOLN: 6.2500 mg | INTRAMUSCULAR | Status: DC | PRN

## 2013-08-14 MED ORDER — ONDANSETRON HCL 4 MG/2ML IJ SOLN
INTRAMUSCULAR | Status: DC | PRN
  Administered 2013-08-14: 4 mg via INTRAVENOUS

## 2013-08-14 MED ORDER — KETOROLAC TROMETHAMINE 30 MG/ML IJ SOLN: 15.0000 mg | Freq: Once | INTRAMUSCULAR | Status: DC | PRN

## 2013-08-14 MED ORDER — IBUPROFEN 600 MG PO TABS: 600.0000 mg | ORAL_TABLET | Freq: Four times a day (QID) | ORAL | Status: DC | PRN

## 2013-08-14 MED ORDER — PROPOFOL 10 MG/ML IV EMUL
INTRAVENOUS | Status: AC
  Filled 2013-08-14: qty 20

## 2013-08-14 MED ORDER — PHENYLEPHRINE 40 MCG/ML (10ML) SYRINGE FOR IV PUSH (FOR BLOOD PRESSURE SUPPORT)
PREFILLED_SYRINGE | INTRAVENOUS | Status: AC
  Filled 2013-08-14: qty 5

## 2013-08-14 MED ORDER — PHENYLEPHRINE HCL 10 MG/ML IJ SOLN
INTRAMUSCULAR | Status: DC | PRN
  Administered 2013-08-14: 40 ug via INTRAVENOUS

## 2013-08-14 SURGICAL SUPPLY — 18 items
CANISTER SUCTION 2500CC (MISCELLANEOUS) ×2 IMPLANT
CATH ROBINSON RED A/P 16FR (CATHETERS) ×2 IMPLANT
CLOTH BEACON ORANGE TIMEOUT ST (SAFETY) ×2 IMPLANT
CONTAINER PREFILL 10% NBF 60ML (FORM) ×4 IMPLANT
DRESSING TELFA 8X3 (GAUZE/BANDAGES/DRESSINGS) ×2 IMPLANT
ELECT REM PT RETURN 9FT ADLT (ELECTROSURGICAL) ×2
ELECTRODE REM PT RTRN 9FT ADLT (ELECTROSURGICAL) ×1 IMPLANT
ELECTRODE ROLLER VERSAPOINT (ELECTRODE) IMPLANT
ELECTRODE RT ANGLE VERSAPOINT (CUTTING LOOP) IMPLANT
GLOVE BIO SURGEON STRL SZ 6.5 (GLOVE) ×2 IMPLANT
GLOVE BIOGEL PI IND STRL 7.0 (GLOVE) ×1 IMPLANT
GLOVE BIOGEL PI INDICATOR 7.0 (GLOVE) ×1
GOWN STRL REIN XL XLG (GOWN DISPOSABLE) ×4 IMPLANT
LOOP ANGLED CUTTING 22FR (CUTTING LOOP) ×2 IMPLANT
PACK HYSTEROSCOPY LF (CUSTOM PROCEDURE TRAY) ×2 IMPLANT
PAD OB MATERNITY 4.3X12.25 (PERSONAL CARE ITEMS) ×2 IMPLANT
TOWEL OR 17X24 6PK STRL BLUE (TOWEL DISPOSABLE) ×4 IMPLANT
WATER STERILE IRR 1000ML POUR (IV SOLUTION) ×2 IMPLANT

## 2013-08-14 NOTE — Anesthesia Postprocedure Evaluation (Signed)
Anesthesia Post Note  Patient: Martha Mata  Procedure(s) Performed: Procedure(s) (LRB): DILATATION & CURETTAGE/HYSTEROSCOPY WITH RESECTOCOPE (N/A)  Anesthesia type: General  Patient location: PACU  Post pain: Pain level controlled  Post assessment: Post-op Vital signs reviewed  Last Vitals:  Filed Vitals:   08/14/13 1500  BP:   Pulse:   Temp:   Resp: 20    Post vital signs: Reviewed  Level of consciousness: sedated  Complications: No apparent anesthesia complications

## 2013-08-14 NOTE — Brief Op Note (Signed)
08/14/2013  2:43 PM  PATIENT:  Martha Mata  72 y.o. female  PRE-OPERATIVE DIAGNOSIS:  Postmenopausal Bleeding, Endometrial Mass   POST-OPERATIVE DIAGNOSIS:  postmenopausal bleeding, endometrial polyps, submucosal fibroid  PROCEDURE:  Diagnostic hysteroscopy, hysteroscopic resection of endometrial polyps, SM fibroid  Resection, dilation and curettage  SURGEON:  Surgeon(s) and Role:    * Harmon Bommarito Cathie Beams, MD - Primary  PHYSICIAN ASSISTANT:   ASSISTANTS: none   ANESTHESIA:   general FINDINGS:  Endometrial polyps, SM fibroid( ant LUS, left tubal ostia seen EBL:  Total I/O In: 1000 [I.V.:1000] Out: 50 [Urine:30; Blood:20]  BLOOD ADMINISTERED:none  DRAINS: none   LOCAL MEDICATIONS USED:  NONE  SPECIMEN:  Source of Specimen:  emc, fibroid and polyps  DISPOSITION OF SPECIMEN:  PATHOLOGY  COUNTS:  YES  TOURNIQUET:  * No tourniquets in log *  DICTATION: .Other Dictation: Dictation Number [id number]  PLAN OF CARE: Discharge to home after PACU  PATIENT DISPOSITION:  PACU - hemodynamically stable.   Delay start of Pharmacological VTE agent (>24hrs) due to surgical blood loss or risk of bleeding: no

## 2013-08-14 NOTE — Transfer of Care (Signed)
Immediate Anesthesia Transfer of Care Note  Patient: Martha Mata  Procedure(s) Performed: Procedure(s): DILATATION & CURETTAGE/HYSTEROSCOPY WITH RESECTOCOPE (N/A)  Patient Location: PACU  Anesthesia Type:General  Level of Consciousness: awake, alert  and oriented  Airway & Oxygen Therapy: Patient Spontanous Breathing and Patient connected to nasal cannula oxygen  Post-op Assessment: Report given to PACU RN and Post -op Vital signs reviewed and stable  Post vital signs: stable  Complications: No apparent anesthesia complications

## 2013-08-14 NOTE — Anesthesia Preprocedure Evaluation (Signed)
Anesthesia Evaluation  Patient identified by MRN, date of birth, ID band Patient awake    Reviewed: Allergy & Precautions, H&P , NPO status , Patient's Chart, lab work & pertinent test results, reviewed documented beta blocker date and time   Airway Mallampati: II TM Distance: >3 FB Neck ROM: full    Dental no notable dental hx. (+) Teeth Intact   Pulmonary neg pulmonary ROS,    Pulmonary exam normal       Cardiovascular hypertension, Pt. on medications and Pt. on home beta blockers     Neuro/Psych Memory loss so avoid Versed.negative neurological ROS  negative psych ROS   GI/Hepatic Neg liver ROS,   Endo/Other  negative endocrine ROS  Renal/GU negative Renal ROS  negative genitourinary   Musculoskeletal   Abdominal Normal abdominal exam  (+)   Peds  Hematology negative hematology ROS (+)   Anesthesia Other Findings   Reproductive/Obstetrics negative OB ROS                           Anesthesia Physical Anesthesia Plan  ASA: II  Anesthesia Plan: General   Post-op Pain Management:    Induction: Intravenous  Airway Management Planned: LMA  Additional Equipment:   Intra-op Plan:   Post-operative Plan:   Informed Consent: I have reviewed the patients History and Physical, chart, labs and discussed the procedure including the risks, benefits and alternatives for the proposed anesthesia with the patient or authorized representative who has indicated his/her understanding and acceptance.     Plan Discussed with: CRNA and Surgeon  Anesthesia Plan Comments:         Anesthesia Quick Evaluation

## 2013-08-15 NOTE — Op Note (Signed)
Martha Mata, Martha Mata NO.:  [id number]  MEDICAL RECORD NO.:  [id number]  LOCATION:  [facility]                          FACILITY:  [facility]  PHYSICIAN:  Maxie Better, M.D.DATE OF BIRTH:  26-Nov-1940  DATE OF PROCEDURE:  08/14/2013 DATE OF DISCHARGE:  08/14/2013                              OPERATIVE REPORT   PREOPERATIVE DIAGNOSES: 1. Postmenopausal bleeding. 2. Endometrial mass.  POSTOPERATIVE DIAGNOSES: 1. Postmenopausal bleeding. 2. Endometrial polyp. 3. Submucosal fibroid.  PROCEDURES: 1. Diagnostic hysteroscopy. 2. Hysteroscopic resection of endometrial polyps and Submucosal fibroids. 3. Dilation and curettage.  ANESTHESIA:  General.  SURGEON:  Maxie Better, M.D.  ASSISTANT:  None.  PROCEDURE:  Under adequate general anesthesia, the patient was placed in the dorsal lithotomy position.  She was sterilely prepped and draped in usual fashion.  Bladder was catheterized of small amount of urine. Examination under anesthesia revealed anteverted uterus.  No adnexal masses could be appreciated.  A bivalve speculum placed in the vagina. Single-tooth tenaculum was placed on the anterior lip of the cervix. The cervix was serially dilated up to #[facility] dilator.  A resectoscope with a single loop was inserted into the uterine cavity.  The left tubal ostia could be seen.  The right was sclerosed.  On the left lateral wall, there was anterior and lateral wall polyps that were noted, both of which were resected.  At the lower uterine segment anteriorly was a small submucosal fibroid, which was also resected.  Those pieces were removed.  The resectoscope removed, the cavity was curetted for scant amount of tissue.  At that point, the procedure was felt to be complete. There was no lesions in the endocervical canal.  All instruments were then removed from the vagina.  SPECIMENS:  Endometrial curetting and endometrial polyps and fibroids, all sent to  Pathology.  ESTIMATED BLOOD LOSS:  Minimal.  COMPLICATIONS:  None.  The patient tolerated the procedure well, was transferred to recovery in stable condition.     Maxie Better, M.D.     [place]/[facility]  D:  08/14/2013  T:  08/15/2013  Job:  [id number]

## 2013-08-17 ENCOUNTER — Encounter: Payer: Self-pay | Admitting: Neurology

## 2013-08-17 ENCOUNTER — Encounter (HOSPITAL_COMMUNITY): Payer: Self-pay | Admitting: Obstetrics and Gynecology

## 2013-08-17 LAB — VITAMIN B1, WHOLE BLOOD: Thiamine: 122.8 nmol/L (ref 66.5–200.0)

## 2013-08-17 LAB — VITAMIN B12: Vitamin B-12: >1999 pg/mL — ABNORMAL HIGH (ref 211–946)

## 2013-08-24 ENCOUNTER — Ambulatory Visit
Admission: RE | Admit: 2013-08-24 | Discharge: 2013-08-24 | Disposition: A | Discharge status: Home / Self Care | Payer: Medicare Other | Source: Ambulatory Visit | Attending: Neurology | Admitting: Neurology

## 2013-08-24 DIAGNOSIS — R4189 Other symptoms and signs involving cognitive functions and awareness: Secondary | ICD-10-CM

## 2013-08-24 DIAGNOSIS — R413 Other amnesia: Secondary | ICD-10-CM

## 2013-08-27 NOTE — Progress Notes (Signed)
Quick Note:  Pt called and I relayed that her MRI results were unremarkable. She verbalized understanding. ______

## 2013-10-12 ENCOUNTER — Encounter: Payer: Self-pay | Admitting: Neurology

## 2013-10-12 ENCOUNTER — Encounter (INDEPENDENT_AMBULATORY_CARE_PROVIDER_SITE_OTHER): Payer: Self-pay

## 2013-10-12 ENCOUNTER — Ambulatory Visit (INDEPENDENT_AMBULATORY_CARE_PROVIDER_SITE_OTHER): Payer: Medicare Other | Admitting: Neurology

## 2013-10-12 ENCOUNTER — Ambulatory Visit (INDEPENDENT_AMBULATORY_CARE_PROVIDER_SITE_OTHER): Payer: Medicare Other | Admitting: Radiology

## 2013-10-12 VITALS — BP 143/71 | HR 69 | Ht 66.25 in | Wt 204.0 lb

## 2013-10-12 DIAGNOSIS — F09 Unspecified mental disorder due to known physiological condition: Secondary | ICD-10-CM

## 2013-10-12 DIAGNOSIS — R4182 Altered mental status, unspecified: Secondary | ICD-10-CM

## 2013-10-12 DIAGNOSIS — R4189 Other symptoms and signs involving cognitive functions and awareness: Secondary | ICD-10-CM

## 2013-10-12 MED ORDER — RIVASTIGMINE 4.6 MG/24HR TD PT24: 4.6000 mg | MEDICATED_PATCH | Freq: Every day | TRANSDERMAL | Status: DC

## 2013-10-12 NOTE — Progress Notes (Signed)
[facility]  Provider:  Dr Hosie Poisson Referring Provider: Laurena Slimmer, MD Primary Care Physician:  Martha Slimmer, MD  CC:  Memory loss  HPI:  Martha Mata is a 72 y.o. female here as a follow up from Dr. Chestine Mata for memory loss  Since last visit patient has had MRI of the brain which is overall unremarkable and lab work for cognitive decline which was also unremarkable. Patient continues to have difficulty with short-term memory, trouble with day-to-day activities per motor memory remains good. Denies any hallucinations. Patient currently lives with her son. Denies any headache, no recent illnesses no neck stiffness. No seizure activity. Not currently taking any medication for cognitive decline.  Initial visit 08/2013: First started noticing this around 6 months ago. Predominately trouble with short term memory, trouble finding things calm her memory which is able to do that today. Per her son the left conversations and 30 minutes later she will repeat the same conversation. Gets confused less intense on TV, thinks she is only seen her before. She currently lives alone, continues to manage her finances and feels she is doing fine with this. No missed pills. Family notices her house is getting a little more clot or anxious collecting things. She does continue to drive. Has not gotten lost or had any accidents. Sleeping well, no hallucinations. Remote memory is good. No strokes or TIAs, no diabetes. Has hypertension. Has light alcohol intake no tobacco use.  Patients mother had dementia.   Review of Systems: Out of a complete 14 system review, the patient complains of only the following symptoms, and all other reviewed systems are negative. Positive for snoring feeling cold aching muscles memory loss   History   Social History  . Marital Status: Widowed    Spouse Name: N/A    Number of Children: 2  . Years of Education: 14   Occupational History  . retired     Social History Main Topics  . Smoking status: Never Smoker   . Smokeless tobacco: Never Used  . Alcohol Use: Yes     Comment: occasionally,one glass of wine monthly  . Drug Use: No  . Sexual Activity: Not on file   Other Topics Concern  . Not on file   Social History Narrative   Patient is retired.    Patient is a non-smoker    Patient lives at home.   Patient consumes caffeine once monthly   Patient is right handed.   Patient has a college education.   Patient has two children.    History reviewed. No pertinent family history.  Past Medical History  Diagnosis Date  . Hypertension   . Memory loss of unknown cause     per pt's admission  . GERD (gastroesophageal reflux disease)     no meds  . Vitamin D deficiency disease   . Fibroid, uterine     Past Surgical History  Procedure Laterality Date  . Knee arthrocentesis  2010  . Cervix lesion destruction    . Hysteroscopy    . Dilation and curettage of uterus    . Tubal ligation    . Colonoscopy  2005  . Uterine fibroid surgery  2005  . Dilatation & currettage/hysteroscopy with resectocope N/A 08/14/2013    Procedure: DILATATION & CURETTAGE/HYSTEROSCOPY WITH RESECTOCOPE;  Surgeon: Martha Kyle, MD;  Location: [facility];  Service: Gynecology;  Laterality: N/A;    Current Outpatient Prescriptions  Medication Sig Dispense Refill  . amLODipine (NORVASC) 10 MG  tablet Take 10 mg by mouth daily.      . benazepril (LOTENSIN) 20 MG tablet Take 20 mg by mouth daily.      . cholecalciferol (VITAMIN D) 1000 UNITS tablet Take 1,000 Units by mouth daily.      Marland Kitchen ibuprofen (ADVIL,MOTRIN) 600 MG tablet Take 1 tablet (600 mg total) by mouth every 6 (six) hours as needed for pain.  30 tablet  0  . rivastigmine (EXELON) 4.6 mg/24hr Place 1 patch (4.6 mg total) onto the skin daily. One patch daily as instructed. Alternate patch placement  30 patch  3  . vitamin B-12 (CYANOCOBALAMIN) 500 MCG tablet Take 500 mcg by mouth daily.       . vitamin C (ASCORBIC ACID) 500 MG tablet Take 500 mg by mouth daily.      . vitamin E 400 UNIT capsule Take 400 Units by mouth daily.       No current facility-administered medications for this visit.    Allergies as of 10/12/2013  . (No Known Allergies)    Vitals: BP 143/71  Pulse 69  Ht 5' 6.25" (1.683 m)  Wt 204 lb (92.534 kg)  BMI 32.67 kg/m2 Last Weight:  Wt Readings from Last 1 Encounters:  10/12/13 204 lb (92.534 kg)   Last Height:   Ht Readings from Last 1 Encounters:  10/12/13 5' 6.25" (1.683 m)     Physical exam: Exam: Gen: NAD, conversant Eyes: anicteric sclerae, moist conjunctivae HENT: Atraumatic Neck: Trachea midline; supple,  Lungs: CTA, no wheezing, rales, rhonic                          CV: RRR, no MRG Abdomen: Soft, non-tender;  Extremities: No peripheral edema  Skin: Normal temperature, no rash,  Psych: Appropriate affect, pleasant  Neuro: MS: alert, pleasant MOCA 10/30 at prior visit  CN: PERRL, EOMI no nystagmus, no ptosis, sensation intact to LT V1-V3 bilat, face symmetric, no weakness, hearing grossly intact, palate elevates symmetrically, shoulder shrug 5/5 bilat,  tongue protrudes midline, no fasiculations noted.  Motor: normal bulk and tone Strength: 5/5  In all extremities  Coord: rapid alternating and point-to-point (FNF, HTS) movements intact.  Reflexes: symmetrical, bilat downgoing toes  Sens: LT intact in all extremities  Gait: posture, stance, stride and arm-swing normal. Romberg absent.   Assessment:  After physical and neurologic examination, review of laboratory studies, imaging, neurophysiology testing and pre-existing records, assessment will be reviewed on the problem list.  Plan:  Treatment plan and additional workup will be reviewed under Problem List.  Ms Mata is a pleasant 72y/o woman presenting for follow up evaluation of cognitive decline. Began noticing symptoms around 6-8 months ago. Predominately  trouble with short term memory, confusion and forgetfulness. Remote memory remains intact. Physical exam unremarkable except for MOCA score of 10/30. Patient does have a family history of dementia. Unclear etiology, MRI imaging a basic lab workup for unremarkable. Discussed extensively with patient and her family the possible causes, including Alzheimer's dementia. We will check an EEG at this time. Will start patient on Exelon 4.6 mg patch daily. Patient to followup as needed.  1)Cognitive decline  -check EEG -start Exelon 4.6mg  patch -follow up 12 months or earlier as needed

## 2013-10-12 NOTE — Patient Instructions (Signed)
Overall you are doing fairly well but I do want to suggest a few things today:   Remember to drink plenty of fluid, eat healthy meals and do not skip any meals. Try to eat protein with a every meal and eat a healthy snack such as fruit or nuts in between meals. Try to keep a regular sleep-wake schedule and try to exercise daily, particularly in the form of walking, 20-30 minutes a day, if you can.   As far as your medications are concerned, I would like to suggest starting a medication called Exelon 4.6mg . It is a patch and you take one patch daily.   As far as diagnostic testing:  1)EEG  I would like to see you back in 12 months.   Please also call us for any test results so we can go over those with you on the phone.  My clinical assistant and will answer any of your questions and relay your messages to me and also relay most of my messages to you.   Our phone number is [phone]. We also have an after hours call service for urgent matters and there is a physician on-call for urgent questions. For any emergencies you know to call 911 or go to the nearest emergency room

## 2013-10-12 NOTE — Procedures (Signed)
    History:  Martha Mata is a 72 year old patient with a history of a progressive memory disturbance associated with confusion, repeating herself. The patient is being evaluated for this issue.  This is a routine EEG. No skull defects are noted. Medications include Norvasc, Lotensin, vitamin D, Exelon, vitamin B12, vitamin C, and vitamin E.   EEG classification: Essentially normal awake  Description of the recording: The background rhythms of this recording consists of a fairly well modulated medium amplitude alpha rhythm of 10 Hz that is reactive to eye opening and closure. As the record progresses, the patient appears to remain in the waking state throughout the recording. Photic stimulation was performed, resulting in a bilateral and symmetric photic driving response. Hyperventilation was not performed. At no time during the recording does there appear to be evidence of spike or spike wave discharges or evidence of focal slowing. EKG monitor shows no evidence of cardiac rhythm abnormalities with a heart rate of 72.  Impression: This is an essentially normal EEG recording in the waking state. No evidence of ictal or interictal discharges are seen.

## 2013-10-13 ENCOUNTER — Telehealth: Payer: Self-pay | Admitting: Neurology

## 2013-10-13 NOTE — Telephone Encounter (Signed)
Shared normal EEG results with patient's son per Dr Minus Breeding findings, verbalized understanding

## 2013-11-16 ENCOUNTER — Telehealth: Payer: Self-pay | Admitting: Neurology

## 2013-11-16 MED ORDER — RIVASTIGMINE 4.6 MG/24HR TD PT24: 4.6000 mg | MEDICATED_PATCH | Freq: Every day | TRANSDERMAL | Status: DC

## 2013-11-16 NOTE — Telephone Encounter (Signed)
Dr Hosie Poisson already sent this Rx to the pharmacy last month.  I have resent it again today.  I called the patient back.  Martha Mata is aware Rx was sent to pharmacy.

## 2013-11-24 ENCOUNTER — Telehealth: Payer: Self-pay | Admitting: Neurology

## 2013-11-24 NOTE — Telephone Encounter (Signed)
Patient's son called to state that Dr. Janann Colonel gave patient free trial of Exelon patch and the pharmacy says the script is ready for them but it is considered a narcotic and they need a call from [place] to release it. Patient's son is wondering if this is true and would like a call back about whether it is classified a narcotic. Please call him back at the number listed.

## 2013-11-24 NOTE — Telephone Encounter (Signed)
Exelon is not a narcotic medication.  I called the pharmacy.  Spoke with [employer].  He said the ins doesn't cover the medication. I called the son back.  Explained this is not a narcotic medication and also explained it is not currently covered by ins, but we will contact them requesting an exception. In the meantime, they will use the voucher for one free month so she has medication while we are pending a response from ins.   I have contacted the ins to try and obtain a formulary exception, however, this may not get approved, as the plan prefers generic oral meds.  Son is aware.

## 2014-02-10 ENCOUNTER — Telehealth: Payer: Self-pay | Admitting: Neurology

## 2014-02-10 NOTE — Telephone Encounter (Signed)
Pt calling stating that she has jury duty and pt was wondering if she could do this due to her memory problems. Pt would like Dr. Janann Colonel to call her back please. Thanks

## 2014-02-10 NOTE — Telephone Encounter (Signed)
Patient called to state that she has been summoned for jury duty and she is wondering if she is able to do it due to her memory problems. Please call and advise patient.

## 2014-02-10 NOTE — Telephone Encounter (Signed)
Please let her know that she will likely not be a good candidate for jury duty. She should check with their office and let them know of her concerns. If they require a medical letter I will be happy to write her one. Thanks.

## 2014-02-10 NOTE — Telephone Encounter (Signed)
Called pt to inform her per Dr. Janann Colonel that she will likely not be a good candidate for jury duty and that she should check with their office to let them know of her concerns and if she needed a medical letter, that he would write one for her. I advised the pt that if she has any other problems, questions or concerns to call the office. Pt verbalized understanding.

## 2014-06-24 NOTE — Telephone Encounter (Signed)
Noted  

## 2014-12-02 ENCOUNTER — Encounter (HOSPITAL_COMMUNITY): Payer: Self-pay | Admitting: Obstetrics and Gynecology

## 2015-06-08 DIAGNOSIS — E559 Vitamin D deficiency, unspecified: Secondary | ICD-10-CM | POA: Diagnosis not present

## 2015-06-08 DIAGNOSIS — E042 Nontoxic multinodular goiter: Secondary | ICD-10-CM | POA: Diagnosis not present

## 2015-06-08 DIAGNOSIS — R609 Edema, unspecified: Secondary | ICD-10-CM | POA: Diagnosis not present

## 2015-06-08 DIAGNOSIS — M150 Primary generalized (osteo)arthritis: Secondary | ICD-10-CM | POA: Diagnosis not present

## 2015-06-08 DIAGNOSIS — I10 Essential (primary) hypertension: Secondary | ICD-10-CM | POA: Diagnosis not present

## 2015-06-08 DIAGNOSIS — R51 Headache: Secondary | ICD-10-CM | POA: Diagnosis not present

## 2015-06-08 DIAGNOSIS — K209 Esophagitis, unspecified: Secondary | ICD-10-CM | POA: Diagnosis not present

## 2016-03-01 DIAGNOSIS — Z1231 Encounter for screening mammogram for malignant neoplasm of breast: Secondary | ICD-10-CM | POA: Diagnosis not present

## 2016-03-01 DIAGNOSIS — Z124 Encounter for screening for malignant neoplasm of cervix: Secondary | ICD-10-CM | POA: Diagnosis not present

## 2016-03-01 DIAGNOSIS — Z01419 Encounter for gynecological examination (general) (routine) without abnormal findings: Secondary | ICD-10-CM | POA: Diagnosis not present

## 2016-03-06 DIAGNOSIS — M150 Primary generalized (osteo)arthritis: Secondary | ICD-10-CM | POA: Diagnosis not present

## 2016-03-06 DIAGNOSIS — E042 Nontoxic multinodular goiter: Secondary | ICD-10-CM | POA: Diagnosis not present

## 2016-03-06 DIAGNOSIS — I8312 Varicose veins of left lower extremity with inflammation: Secondary | ICD-10-CM | POA: Diagnosis not present

## 2016-03-06 DIAGNOSIS — R609 Edema, unspecified: Secondary | ICD-10-CM | POA: Diagnosis not present

## 2016-03-06 DIAGNOSIS — I10 Essential (primary) hypertension: Secondary | ICD-10-CM | POA: Diagnosis not present

## 2016-05-07 DIAGNOSIS — M150 Primary generalized (osteo)arthritis: Secondary | ICD-10-CM | POA: Diagnosis not present

## 2016-05-07 DIAGNOSIS — R413 Other amnesia: Secondary | ICD-10-CM | POA: Diagnosis not present

## 2016-05-07 DIAGNOSIS — E042 Nontoxic multinodular goiter: Secondary | ICD-10-CM | POA: Diagnosis not present

## 2016-05-07 DIAGNOSIS — I10 Essential (primary) hypertension: Secondary | ICD-10-CM | POA: Diagnosis not present

## 2016-05-17 ENCOUNTER — Encounter: Payer: Self-pay | Admitting: Neurology

## 2016-05-17 ENCOUNTER — Ambulatory Visit (INDEPENDENT_AMBULATORY_CARE_PROVIDER_SITE_OTHER): Payer: Medicare Other | Admitting: Neurology

## 2016-05-17 VITALS — BP 134/75 | HR 61 | Ht 65.0 in | Wt 201.8 lb

## 2016-05-17 DIAGNOSIS — G308 Other Alzheimer's disease: Secondary | ICD-10-CM | POA: Diagnosis not present

## 2016-05-17 DIAGNOSIS — F0280 Dementia in other diseases classified elsewhere without behavioral disturbance: Secondary | ICD-10-CM | POA: Diagnosis not present

## 2016-05-17 DIAGNOSIS — G309 Alzheimer's disease, unspecified: Secondary | ICD-10-CM

## 2016-05-17 MED ORDER — DONEPEZIL HCL 10 MG PO TABS: 10.0000 mg | ORAL_TABLET | Freq: Every day | ORAL | Status: DC

## 2016-05-17 NOTE — Progress Notes (Signed)
[facility]    Provider:  Dr Jaynee Eagles Referring Provider: Foye Spurling, MD Primary Care Physician:  Foye Spurling, MD  CC:  Memory loss  HPI:  Martha Mata is a 75 y.o. female here as a referral from Dr. Carlis Abbott for memory loss. Here with caretaker who provides information. She is here as a follow up.  Memory changes started about 3 years ago. Slowly progressive, slowly getting worse. Started with short-term memory problems, repeating conversations. Caretaker says she loses her keys, she had to put a key rack in her room, she forgets things she is told, asks the same questions over and over again, forgets conversations, she cooks quick things doesn't cook big meals anymore. Caretaker is there a few hours a day. She lives alone in a condo. Caretaker does the driving, no accidents in the home, no falls. Son keep a camera in the house to watch her and make sure she is safe, he lives in [place]. Discussed assisted living close to son, recommended that she look into it. No hallucinations, agitation, no issues at all. No depression, no behavioral problems, she is still very social, goes to the [employer] weekly. Son has taken over finances and caring for the house in the last few years. She takes OTC b12. TSH has been normal in the past.   Reviewed notes, labs and imaging from outside physicians, which showed:  CBC nml, CMP with creatinine is 0.86 normal labs were taken 03/06/2016, LDL 104. TSH in the past has been normal. Patient takes daily B12 supplementation so we'll not check B12.  MRI of the brain 09/07/2013 (personally reviewed imaging and agree with the following):  Mildly abnormal MRI brain (without) demonstrating: 1. Few scattered foci of non-specific gliosis in the subcortical and juxtacortical white matter.  2. Single left occipital punctate focus of SWI hypointensity, may represent a cerebral microhemorrhage. 3. Above findings may be related to  underlying mild chronic small vessel ischemic disease.  CC: Memory loss  HPI: Martha Mata is a 75 y.o. female here as a follow up from Dr. Carlis Abbott for memory loss  Since last visit patient has had MRI of the brain which is overall unremarkable and lab work for cognitive decline which was also unremarkable. Patient continues to have difficulty with short-term memory, trouble with day-to-day activities per motor memory remains good. Denies any hallucinations. Patient currently lives with her son. Denies any headache, no recent illnesses no neck stiffness. No seizure activity. Not currently taking any medication for cognitive decline.  Initial visit 08/2013: First started noticing this around 6 months ago. Predominately trouble with short term memory, trouble finding things calm her memory which is able to do that today. Per her son the left conversations and 30 minutes later she will repeat the same conversation. Gets confused less intense on TV, thinks she is only seen her before. She currently lives alone, continues to manage her finances and feels she is doing fine with this. No missed pills. Family notices her house is getting a little more clot or anxious collecting things. She does continue to drive. Has not gotten lost or had any accidents. Sleeping well, no hallucinations. Remote memory is good. No strokes or TIAs, no diabetes. Has hypertension. Has light alcohol intake no tobacco use.  Patients mother had dementia.   Review of Systems: Patient complains of symptoms per HPI as well as the following symptoms: No chest pain, no heart disease, no shortness of breath. Pertinent negatives per  HPI. All others negative.   Social History   Social History  . Marital Status: Widowed    Spouse Name: N/A  . Number of Children: 2  . Years of Education: 12+   Occupational History  . Retired    Social History Main Topics  . Smoking status: Never Smoker   . Smokeless tobacco: Never Used  .  Alcohol Use: Yes     Comment: occasionally,one glass of wine monthly  . Drug Use: No  . Sexual Activity: Not on file   Other Topics Concern  . Not on file   Social History Narrative   Patient is retired.    Patient is a non-smoker    Patient lives at home.   Patient consumes tea (1-2 times daily)   Patient is right handed.   Patient has a college education.   Patient has two children.    History reviewed. No pertinent family history.  Past Medical History  Diagnosis Date  . Hypertension   . Memory loss of unknown cause     per pt's admission  . GERD (gastroesophageal reflux disease)     no meds  . Vitamin D deficiency disease   . Fibroid, uterine     Past Surgical History  Procedure Laterality Date  . Knee arthrocentesis  2010  . Cervix lesion destruction    . Hysteroscopy    . Dilation and curettage of uterus    . Tubal ligation    . Colonoscopy  2005  . Uterine fibroid surgery  2005  . Dilatation & currettage/hysteroscopy with resectocope N/A 08/14/2013    Procedure: DILATATION & CURETTAGE/HYSTEROSCOPY WITH RESECTOCOPE;  Surgeon: Marvene Staff, MD;  Location: [facility];  Service: Gynecology;  Laterality: N/A;    Current Outpatient Prescriptions  Medication Sig Dispense Refill  . amLODipine (NORVASC) 10 MG tablet Take 5 mg by mouth daily.     . metoprolol-hydrochlorothiazide (LOPRESSOR HCT) 100-25 MG tablet Take 0.5 tablets by mouth daily.    . vitamin E 400 UNIT capsule Take 400 Units by mouth daily.     No current facility-administered medications for this visit.    Allergies as of 05/17/2016  . (No Known Allergies)    Vitals: BP 134/75 mmHg  Pulse 61  Ht 5\' 5"  (1.651 m)  Wt 201 lb 12.8 oz (91.536 kg)  BMI 33.58 kg/m2 Last Weight:  Wt Readings from Last 1 Encounters:  05/17/16 201 lb 12.8 oz (91.536 kg)   Last Height:   Ht Readings from Last 1 Encounters:  05/17/16 5\' 5"  (1.651 m)   Physical exam: Exam: Gen: NAD, pleasant, follows  commands  Neuro: Detailed Neurologic Exam  Speech:    Speech is normal; fluent and spontaneous with normal comprehension.  Cognition: MMSE - Mini Mental State Exam 05/17/2016  Orientation to time 0  Orientation to Place 2  Registration 3  Attention/ Calculation 0  Recall 0  Language- name 2 objects 2  Language- repeat 1  Language- follow 3 step command 3  Language- read & follow direction 1  Write a sentence 0  Copy design 0  Total score 12      recent and remote memory impaired ;     language fluent;     normal attention, concentration,     fund of knowledge impaired Cranial Nerves:    The pupils are equal, round, and reactive to light.  Visual fields are full to finger confrontation. Extraocular movements are intact. Trigeminal sensation is intact and the  muscles of mastication are normal. The face is symmetric. The palate elevates in the midline. Hearing intact. Voice is normal. Shoulder shrug is normal. The tongue has normal motion without fasciculations.   Motor Observation:    No asymmetry, no atrophy, and no involuntary movements noted. Tone:    Normal muscle tone.    Posture:    Posture is normal. normal erect    Strength:    Strength is V/V in the upper and lower limbs.      Sensation: intact to LT          Assessment/Plan:  Very lovely 89 year year-old likely Alzheimer's dementia. Any mental status exam today is 12 out of 30 which is consistent with significant cognitive deficits. Patient however looks great, and she denies depression or behavioral issues, she has a caregiver daily, otherwise she is very functional. She has a son in [place] he keeps close watch on her and takes care of all the household bills and maintenance. I did discuss the possibility of moving closer to her son and is listed living at some point and advised him to take a look at that. We'll start her on Aricept and at the next appointment [place].  Start Aricept today Start  Namenda at next appointment  Sarina Ill, MD  [facility] [address] [place], [place] [address]  Phone [phone] Fax [phone]  A total of 30 minutes was spent face-to-face with this patient. Over half this time was spent on counseling patient on the dementia diagnosis and different diagnostic and therapeutic options available.

## 2016-05-17 NOTE — Patient Instructions (Signed)
Remember to drink plenty of fluid, eat healthy meals and do not skip any meals. Try to eat protein with a every meal and eat a healthy snack such as fruit or nuts in between meals. Try to keep a regular sleep-wake schedule and try to exercise daily, particularly in the form of walking, 20-30 minutes a day, if you can.   As far as your medications are concerned, I would like to suggest: Aricept. 1/2 daily for 1-2 weeks and then increase to a whole pill daily  As far as diagnostic testing: none  I would like to see you back in 4-6, sooner if we need to. Please call us with any interim questions, concerns, problems, updates or refill requests.   Our phone number is [phone]. We also have an after hours call service for urgent matters and there is a physician on-call for urgent questions. For any emergencies you know to call 911 or go to the nearest emergency room

## 2016-06-06 DIAGNOSIS — F0390 Unspecified dementia without behavioral disturbance: Secondary | ICD-10-CM | POA: Diagnosis not present

## 2016-06-06 DIAGNOSIS — R4182 Altered mental status, unspecified: Secondary | ICD-10-CM | POA: Diagnosis not present

## 2016-06-07 DIAGNOSIS — E538 Deficiency of other specified B group vitamins: Secondary | ICD-10-CM | POA: Diagnosis not present

## 2016-06-07 DIAGNOSIS — I10 Essential (primary) hypertension: Secondary | ICD-10-CM | POA: Diagnosis not present

## 2016-06-07 DIAGNOSIS — M150 Primary generalized (osteo)arthritis: Secondary | ICD-10-CM | POA: Diagnosis not present

## 2016-06-07 DIAGNOSIS — E559 Vitamin D deficiency, unspecified: Secondary | ICD-10-CM | POA: Diagnosis not present

## 2016-06-07 DIAGNOSIS — E042 Nontoxic multinodular goiter: Secondary | ICD-10-CM | POA: Diagnosis not present

## 2016-06-08 ENCOUNTER — Telehealth: Payer: Self-pay | Admitting: Neurology

## 2016-06-08 NOTE — Telephone Encounter (Signed)
Dr. Jeanann Lewandowsky is calling to discuss the patient. I advised Dr. Jaynee Eagles is out of the office and Dr. Carlis Abbott would like a call back from the work in doctor who is Dr. Felecia Shelling. Dr.Clark says this is not an emergency and to call at your convenience.

## 2016-06-08 NOTE — Telephone Encounter (Signed)
I called Dr. Jaci Standard back at [phone] twice 1:00 pm and 1:10 pm.....  No answer and no voicemail prompt.  I will forward to Dr. Jaynee Eagles

## 2016-06-11 DIAGNOSIS — B079 Viral wart, unspecified: Secondary | ICD-10-CM | POA: Diagnosis not present

## 2016-06-11 DIAGNOSIS — B078 Other viral warts: Secondary | ICD-10-CM | POA: Diagnosis not present

## 2016-06-11 NOTE — Telephone Encounter (Signed)
Spoke to doctor, she drove the other day and got lost in [place]. It was my understanding she was not driving. I spoke to to caregiver, she does not drive but unfortunately she found keys to her car and went out without anyone knowing. They have taken away all access to keys, she cannot get to the car keys anymore. I advised they take all keys out of the house which they have. Caretaker is there with her all day. Son is highly involved.

## 2016-06-11 NOTE — Telephone Encounter (Signed)
I called and left my cell phone number for him.

## 2016-06-20 NOTE — Telephone Encounter (Signed)
Dr Jaynee Eagles- FYI Called and spoke to son. Advised per Dr Jaynee Eagles, this is not a common side effect of aricept. Could be possible, and he the only way to know for sure if for her to stop medication and see if sx get better. He verbalized understanding but stated he thinks he is going to keep her on the medication because she is in a routine right now and he has alarms set on her phone to remind her to take the medication. He is afraid that changing her routine will make her more upset. He states he is having problems with his sibling right now and he thinks this is upsetting his mother a little because she might feel like she is in the middle of it. Also, she wants to drive, but her keys were taken away. Her car still sits in the driveway. He is trying to figure out where he can keep her car. Advised him to call if he has any further questions or concerns. He verbalized understanding.

## 2016-06-20 NOTE — Telephone Encounter (Addendum)
Son [name] called to inquire about donepezil (ARICEPT) 10 MG tablet and if can cause mood swings, states for the past couple of weeks his mother has been defiant, refuses to open door for caregiver, using profanity, on July 19th she left [employer] on [address] and ended up in [place], she stopped at an [place] (would have recognized Chari Manning because the parent company she worked for [employer] was subsidiary of [employer] where she retired), she got gas and went inside asking questions, employees at the [employer] [place] called the police who took his mother to [facility], states his Mom could only remember a cousin in [place] and a former Theme park manager. The former pastor got in touch with the current pastor who got in touch with him. Please call [phone].

## 2016-09-21 DIAGNOSIS — E042 Nontoxic multinodular goiter: Secondary | ICD-10-CM | POA: Diagnosis not present

## 2016-09-21 DIAGNOSIS — I10 Essential (primary) hypertension: Secondary | ICD-10-CM | POA: Diagnosis not present

## 2016-09-21 DIAGNOSIS — Z23 Encounter for immunization: Secondary | ICD-10-CM | POA: Diagnosis not present

## 2016-10-29 ENCOUNTER — Ambulatory Visit: Payer: Medicare Other | Admitting: Adult Health

## 2016-10-30 ENCOUNTER — Encounter: Payer: Self-pay | Admitting: Adult Health

## 2016-11-26 ENCOUNTER — Encounter: Payer: Self-pay | Admitting: Adult Health

## 2016-11-26 ENCOUNTER — Ambulatory Visit (INDEPENDENT_AMBULATORY_CARE_PROVIDER_SITE_OTHER): Payer: Medicare Other | Admitting: Adult Health

## 2016-11-26 VITALS — BP 138/71 | HR 61 | Ht 65.0 in | Wt 206.0 lb

## 2016-11-26 DIAGNOSIS — R413 Other amnesia: Secondary | ICD-10-CM | POA: Diagnosis not present

## 2016-11-26 MED ORDER — MEMANTINE HCL 28 X 5 MG & 21 X 10 MG PO TABS: ORAL_TABLET | ORAL | 0 refills | Status: DC

## 2016-11-26 NOTE — Patient Instructions (Addendum)
Continue Aricept  Start Namenda If you are able to get the namenda titration pack- after 1 month please call for maintenance dose. If your symptoms worsen or you develop new symptoms please let us know.   Memantine extended release capsules What is this medicine? MEMANTINE (MEM an teen) is used to treat dementia caused by Alzheimer's disease. COMMON BRAND NAME(S): Namenda XR What should I tell my health care provider before I take this medicine? They need to know if you have any of these conditions: -difficulty passing urine -kidney disease -liver disease -seizures -an unusual or allergic reaction to memantine, other medicines, foods, dyes, or preservatives -pregnant or trying to get pregnant -breast-feeding How should I use this medicine? Take this medicine by mouth with a glass of water. Follow the directions on the prescription label. You may take this medicine with or without food. You may swallow the capsules whole or open them and sprinkle the entire contents on applesauce before swallowing. Other than sprinkling the medicine on applesauce, the capsules should be swallowed whole and not divided, chewed, or crushed. Take your doses at regular intervals. Do not take your medicine more often than directed. Continue to take your medicine even if you feel better. Do not stop taking except on the advice of your doctor or health care professional. Talk to your pediatrician regarding the use of this medicine in children. Special care may be needed. What if I miss a dose? If you miss a dose, take it as soon as you can. If it is almost time for your next dose, take only that dose. Do not take double or extra doses. If you do not take your medicine for several days, contact your health care provider. Your dose may need to be changed. What may interact with this  medicine? -acetazolamide -amantadine -cimetidine -dextromethorphan -dofetilide -hydrochlorothiazide -ketamine -metformin -methazolamide -quinidine -ranitidine -sodium bicarbonate -triamterene What should I watch for while using this medicine? Visit your doctor or health care professional for regular checks on your progress. Check with your doctor or health care professional if there is no improvement in your symptoms or if they get worse. You may get drowsy or dizzy. Do not drive, use machinery, or do anything that needs mental alertness until you know how this drug affects you. Do not stand or sit up quickly, especially if you are an older patient. This reduces the risk of dizzy or fainting spells. Alcohol can make you more drowsy and dizzy. Avoid alcoholic drinks. What side effects may I notice from receiving this medicine? Side effects that you should report to your doctor or health care professional as soon as possible: -agitation or a feeling of restlessness -allergic reactions like skin rash, itching or hives, swelling of the face, lips, or tongue -depressed mood -dizziness -hallucinations -redness, blistering, peeling or loosening of the skin, including inside the mouth -seizures -vomiting Side effects that usually do not require medical attention (report to your doctor or health care professional if they continue or are bothersome): -constipation -diarrhea -headache -nausea -trouble sleeping Where should I keep my medicine? Keep out of the reach of children. Store at room temperature between 15 degrees and 30 degrees C (59 degrees and 86 degrees F). Throw away any unused medicine after the expiration date.  2017 [employer] (2015-12-08 10:09:47)

## 2016-11-26 NOTE — Progress Notes (Signed)
PATIENT: Martha Mata DOB: 04-28-1941  REASON FOR VISIT: follow up HISTORY FROM: patient  HISTORY OF PRESENT ILLNESS: Martha Mata is a 76 year old female with a history of memory disturbance. She returns today for follow-up. She is here today with her caretaker and son. They report that her memory has remained stable. She continues to live at home alone. She does have caregivers that check on her throughout the day. She reports that she is able to complete most ADLs independently. She does prepare her own meals without difficulty. Her son helps her with her finances. She does not operate a motor vehicle. Denies any trouble sleeping. Denies getting up at night wandering around the house. Denies hallucinations. Denies restlessness or agitation. Overall she feels that she is doing well. Family agrees with this. She is on Aricept and tolerating it well. She returns today for an evaluation.  HISTORY 05/17/16 [facility]): Martha Mata is a 76 y.o. female here as a referral from Dr. Carlis Abbott for memory loss. Here with caretaker who provides information. She is here as a follow up.  Memory changes started about 3 years ago. Slowly progressive, slowly getting worse. Started with short-term memory problems, repeating conversations. Caretaker says she loses her keys, she had to put a key rack in her room, she forgets things she is told, asks the same questions over and over again, forgets conversations, she cooks quick things doesn't cook big meals anymore. Caretaker is there a few hours a day. She lives alone in a condo. Caretaker does the driving, no accidents in the home, no falls. Son keep a camera in the house to watch her and make sure she is safe, he lives in [place]. Discussed assisted living close to son, recommended that she look into it. No hallucinations, agitation, no issues at all. No depression, no behavioral problems, she is still very social, goes to the [employer] weekly. Son has  taken over finances and caring for the house in the last few years. She takes OTC b12. TSH has been normal in the past.   Reviewed notes, labs and imaging from outside physicians, which showed:  CBC nml, CMP with creatinine is 0.86 normal labs were taken 03/06/2016, LDL 104. TSH in the past has been normal. Patient takes daily B12 supplementation so we'll not check B12.  MRI of the brain 09/07/2013 (personally reviewed imaging and agree with the following):  Mildly abnormal MRI brain (without) demonstrating: 1. Few scattered foci of non-specific gliosis in the subcortical and juxtacortical white matter.  2. Single left occipital punctate focus of SWI hypointensity, may represent a cerebral microhemorrhage. 3. Above findings may be related to underlying mild chronic small vessel ischemic disease.  CC: Memory loss  HPI: Martha Mata is a 76 y.o. female here as a follow up from Dr. Carlis Abbott for memory loss  Since last visit patient has had MRI of the brain which is overall unremarkable and lab work for cognitive decline which was also unremarkable. Patient continues to have difficulty with short-term memory, trouble with day-to-day activities per motor memory remains good. Denies any hallucinations. Patient currently lives with her son. Denies any headache, no recent illnesses no neck stiffness. No seizure activity. Not currently taking any medication for cognitive decline.  Initial visit 08/2013: First started noticing this around 6 months ago. Predominately trouble with short term memory, trouble finding things calm her memory which is able to do that today. Per her son the left conversations and 30 minutes  later she will repeat the same conversation. Gets confused less intense on TV, thinks she is only seen her before. She currently lives alone, continues to manage her finances and feels she is doing fine with this. No missed pills. Family notices her house is getting a little more  clot or anxious collecting things. She does continue to drive. Has not gotten lost or had any accidents. Sleeping well, no hallucinations. Remote memory is good. No strokes or TIAs, no diabetes. Has hypertension. Has light alcohol intake no tobacco use.  Patients mother had dementia.   REVIEW OF SYSTEMS: Out of a complete 14 system review of symptoms, the patient complains only of the following symptoms, and all other reviewed systems are negative.  See history of present illness  ALLERGIES: No Known Allergies  HOME MEDICATIONS: Outpatient Medications Prior to Visit  Medication Sig Dispense Refill  . amLODipine (NORVASC) 10 MG tablet Take 5 mg by mouth daily.     Marland Kitchen donepezil (ARICEPT) 10 MG tablet Take 1 tablet (10 mg total) by mouth at bedtime. Start with 1/2 tablet and then increase to a whole tablet in 1-2 weeks. 30 tablet 12  . metoprolol-hydrochlorothiazide (LOPRESSOR HCT) 100-25 MG tablet Take 0.5 tablets by mouth daily.    . vitamin E 400 UNIT capsule Take 400 Units by mouth daily.     No facility-administered medications prior to visit.     PAST MEDICAL HISTORY: Past Medical History:  Diagnosis Date  . Fibroid, uterine   . GERD (gastroesophageal reflux disease)    no meds  . Hypertension   . Memory loss of unknown cause    per pt's admission  . Vitamin D deficiency disease     PAST SURGICAL HISTORY: Past Surgical History:  Procedure Laterality Date  . CERVIX LESION DESTRUCTION    . COLONOSCOPY  2005  . DILATATION & CURRETTAGE/HYSTEROSCOPY WITH RESECTOCOPE N/A 08/14/2013   Procedure: DILATATION & CURETTAGE/HYSTEROSCOPY WITH RESECTOCOPE;  Surgeon: Marvene Staff, MD;  Location: [facility];  Service: Gynecology;  Laterality: N/A;  . DILATION AND CURETTAGE OF UTERUS    . HYSTEROSCOPY    . KNEE ARTHROCENTESIS  2010  . TUBAL LIGATION    . UTERINE FIBROID SURGERY  2005    FAMILY HISTORY: History reviewed. No pertinent family history.  SOCIAL HISTORY: Social  History   Social History  . Marital status: Widowed    Spouse name: N/A  . Number of children: 2  . Years of education: 12+   Occupational History  . Retired    Social History Main Topics  . Smoking status: Never Smoker  . Smokeless tobacco: Never Used  . Alcohol use Yes     Comment: occasionally,one glass of wine monthly  . Drug use: No  . Sexual activity: Not on file   Other Topics Concern  . Not on file   Social History Narrative   Patient is retired.    Patient is a non-smoker    Patient lives at home.   Patient consumes tea (1-2 times daily)   Patient is right handed.   Patient has a college education.   Patient has two children.      PHYSICAL EXAM  Vitals:   11/26/16 0859  BP: 138/71  Pulse: 61  Weight: 206 lb (93.4 kg)  Height: 5\' 5"  (1.651 m)   Body mass index is 34.28 kg/m.   MMSE - Mini Mental State Exam 11/26/2016 05/17/2016  Orientation to time 3 0  Orientation to Place 2  2  Registration 3 3  Attention/ Calculation 0 0  Recall 0 0  Language- name 2 objects 2 2  Language- repeat 1 1  Language- follow 3 step command 3 3  Language- read & follow direction 1 1  Write a sentence 1 0  Copy design 0 0  Total score 16 12     Generalized: Well developed, in no acute distress   Neurological examination  Mentation: Alert.. Follows all commands speech and language fluent Cranial nerve II-XII: Pupils were equal round reactive to light. Extraocular movements were full, visual field were full on confrontational test. Facial sensation and strength were normal. Uvula tongue midline. Head turning and shoulder shrug  were normal and symmetric. Motor: The motor testing reveals 5 over 5 strength of all 4 extremities. Good symmetric motor tone is noted throughout.  Sensory: Sensory testing is intact to soft touch on all 4 extremities. No evidence of extinction is noted.  Coordination: Cerebellar testing reveals good finger-nose-finger and heel-to-shin  bilaterally.  Gait and station: Gait is normal. Tandem gait is normal. Romberg is negative. No drift is seen.  Reflexes: Deep tendon reflexes are symmetric and normal bilaterally.   DIAGNOSTIC DATA (LABS, IMAGING, TESTING) - I reviewed patient records, labs, notes, testing and imaging myself where available.     ASSESSMENT AND PLAN 76 y.o. year old female  has a past medical history of Fibroid, uterine; GERD (gastroesophageal reflux disease); Hypertension; Memory loss of unknown cause; and Vitamin D deficiency disease. here with:  1. Memory disturbance  Overall the patient is doing well. Her memory score has remained stable. She will continue on Aricept 10 mg daily. I will put the patient on Namenda. I have reviewed the side effects with the patient and her family. Advised that if her symptoms worsen or she develops any new symptoms they should let us know. She will follow-up in 6 months or sooner if needed.     Ward Givens, MSN, NP-C 11/26/2016, 9:09 AM [facility] [address] [place], [place] [address] [phone]

## 2016-12-09 NOTE — Progress Notes (Signed)
  Personally participated in and made any corrections needed to history, physical, neuro exam,assessment and plan as stated above and agree with plan.   Deontae Robson, MD 

## 2016-12-18 ENCOUNTER — Other Ambulatory Visit: Payer: Self-pay | Admitting: Adult Health

## 2016-12-18 ENCOUNTER — Other Ambulatory Visit: Payer: Self-pay | Admitting: *Deleted

## 2016-12-18 MED ORDER — MEMANTINE HCL 10 MG PO TABS: 10.0000 mg | ORAL_TABLET | Freq: Two times a day (BID) | ORAL | 5 refills | Status: DC

## 2017-01-23 ENCOUNTER — Encounter: Payer: Self-pay | Admitting: Adult Health

## 2017-01-23 ENCOUNTER — Other Ambulatory Visit: Payer: Self-pay | Admitting: *Deleted

## 2017-01-23 MED ORDER — DONEPEZIL HCL 10 MG PO TABS
ORAL_TABLET | ORAL | 11 refills | Status: DC
Start: 2017-01-23 — End: 2017-12-17

## 2017-02-15 ENCOUNTER — Other Ambulatory Visit: Payer: Self-pay | Admitting: *Deleted

## 2017-02-15 MED ORDER — MEMANTINE HCL 10 MG PO TABS
10.0000 mg | ORAL_TABLET | Freq: Two times a day (BID) | ORAL | 3 refills | Status: DC
Start: 2017-02-15 — End: 2018-02-16

## 2017-05-27 ENCOUNTER — Encounter: Payer: Self-pay | Admitting: Adult Health

## 2017-05-27 ENCOUNTER — Ambulatory Visit (INDEPENDENT_AMBULATORY_CARE_PROVIDER_SITE_OTHER): Payer: Medicare Other | Admitting: Adult Health

## 2017-05-27 VITALS — BP 101/55 | HR 57 | Wt 205.0 lb

## 2017-05-27 DIAGNOSIS — F0390 Unspecified dementia without behavioral disturbance: Secondary | ICD-10-CM

## 2017-05-27 NOTE — Progress Notes (Signed)
PATIENT: Martha Mata DOB: [birth year]  REASON FOR VISIT: follow up- memory HISTORY FROM: patient  HISTORY OF PRESENT ILLNESS: 05/27/17 Martha Mata is a 76 year old female with a history of memory disturbance. She returns today for follow-up. She is here today with her son. She reports that she continues to live at home. She does have caregivers that checks on her frequently. She does stay at home alone at night. She does have a life alert necklace that she can use it needed. Her son denies any episodes of wandering. Patient is able to complete all ADLs independently. Her son reports that there is limited cooking. She does cook bacon and and soups. He denies any trouble using the stove. Her son manages all of her finances. She denies any trouble sleeping. Denies hallucinations. Denies any changes with her mood or behavior. She does use public transportation to go to a connections group. She returns today for an evaluation.  HISTORY 11/26/16: Martha Mata is a 76 year old female with a history of memory disturbance. She returns today for follow-up. She is here today with her caretaker and son. They report that her memory has remained stable. She continues to live at home alone. She does have caregivers that check on her throughout the day. She reports that she is able to complete most ADLs independently. She does prepare her own meals without difficulty. Her son helps her with her finances. She does not operate a motor vehicle. Denies any trouble sleeping. Denies getting up at night wandering around the house. Denies hallucinations. Denies restlessness or agitation. Overall she feels that she is doing well. Family agrees with this. She is on Aricept and tolerating it well. She returns today for an evaluation.  HISTORY 05/17/16 [facility]): Martha Mata a 75 y.o.femalehere as a referral from Dr. Anthoney Harada memory loss. Here with caretaker who provides information. She is here as a follow up.  Memory changes started about 3 years ago. Slowly progressive, slowly getting worse. Started with short-term memory problems, repeating conversations. Caretaker says she loses her keys, she had to put a key rack in her room, she forgets things she is told, asks the same questions over and over again, forgets conversations, she cooks quick things doesn't cook big meals anymore. Caretaker is there a few hours a day. She lives alone in a condo. Caretaker does the driving, no accidents in the home, no falls. Son keep a camera in the house to watch her and make sure she is safe, he lives in [place]. Discussed assisted living close to son, recommended that she look into it. No hallucinations, agitation, no issues at all. No depression, no behavioral problems, she is still very social, goes to the [employer] weekly. Son has taken over finances and caring for the house in the last few years. She takes OTC b12. TSH has been normal in the past.   Reviewed notes, labs and imaging from outside physicians, which showed:  CBC nml, CMP with creatinine is 0.86 normal labs were taken 03/06/2016, LDL 104. TSH in the past has been normal. Patient takes daily B12 supplementation so we'll not check B12.  MRI of the brain 09/07/2013 (personally reviewed imaging and agree with the following):  Mildly abnormal MRI brain (without) demonstrating: 1. Few scattered foci of non-specific gliosis in the subcortical and juxtacortical white matter.  2. Single left occipital punctate focus of SWI hypointensity, may represent a cerebral microhemorrhage. 3. Above findings may be related to underlying mild chronic small  vessel ischemic disease.  CC: Memory loss  HPI: Martha Mata is a 76 y.o. female here as a follow up from Dr. Carlis Abbott for memory loss  Since last visit patient has had MRI of the brain which is overall unremarkable and lab work for cognitive decline which was also unremarkable. Patient continues to  have difficulty with short-term memory, trouble with day-to-day activities per motor memory remains good. Denies any hallucinations. Patient currently lives with her son. Denies any headache, no recent illnesses no neck stiffness. No seizure activity. Not currently taking any medication for cognitive decline.  Initial visit 08/2013: First started noticing this around 6 months ago. Predominately trouble with short term memory, trouble finding things calm her memory which is able to do that today. Per her son the left conversations and 30 minutes later she will repeat the same conversation. Gets confused less intense on TV, thinks she is only seen her before. She currently lives alone, continues to manage her finances and feels she is doing fine with this. No missed pills. Family notices her house is getting a little more clot or anxious collecting things. She does continue to drive. Has not gotten lost or had any accidents. Sleeping well, no hallucinations. Remote memory is good. No strokes or TIAs, no diabetes. Has hypertension. Has light alcohol intake no tobacco use.  Patients mother had dementia  REVIEW OF SYSTEMS: Out of a complete 14 system review of symptoms, the patient complains only of the following symptoms, and all other reviewed systems are negative.  Runny nose, memory loss  ALLERGIES: No Known Allergies  HOME MEDICATIONS: Outpatient Medications Prior to Visit  Medication Sig Dispense Refill  . amLODipine (NORVASC) 10 MG tablet Take 5 mg by mouth daily.     Marland Kitchen donepezil (ARICEPT) 10 MG tablet Take one tablet daily. 30 tablet 11  . memantine (NAMENDA) 10 MG tablet Take 1 tablet (10 mg total) by mouth 2 (two) times daily. 180 tablet 3  . metoprolol-hydrochlorothiazide (LOPRESSOR HCT) 100-25 MG tablet Take 0.5 tablets by mouth daily.    . vitamin E 400 UNIT capsule Take 400 Units by mouth daily.     No facility-administered medications prior to visit.     PAST MEDICAL  HISTORY: Past Medical History:  Diagnosis Date  . Fibroid, uterine   . GERD (gastroesophageal reflux disease)    no meds  . Hypertension   . Memory loss of unknown cause    per pt's admission  . Vitamin D deficiency disease     PAST SURGICAL HISTORY: Past Surgical History:  Procedure Laterality Date  . CERVIX LESION DESTRUCTION    . COLONOSCOPY  2005  . DILATATION & CURRETTAGE/HYSTEROSCOPY WITH RESECTOCOPE N/A 08/14/2013   Procedure: DILATATION & CURETTAGE/HYSTEROSCOPY WITH RESECTOCOPE;  Surgeon: Marvene Staff, MD;  Location: [facility];  Service: Gynecology;  Laterality: N/A;  . DILATION AND CURETTAGE OF UTERUS    . HYSTEROSCOPY    . KNEE ARTHROCENTESIS  2010  . TUBAL LIGATION    . UTERINE FIBROID SURGERY  2005    FAMILY HISTORY: No family history on file.  SOCIAL HISTORY: Social History   Social History  . Marital status: Widowed    Spouse name: N/A  . Number of children: 2  . Years of education: 12+   Occupational History  . Retired    Social History Main Topics  . Smoking status: Never Smoker  . Smokeless tobacco: Never Used  . Alcohol use Yes     Comment:  occasionally,one glass of wine monthly  . Drug use: No  . Sexual activity: Not on file   Other Topics Concern  . Not on file   Social History Narrative   Patient is retired.    Patient is a non-smoker    Patient lives at home.   Patient consumes tea (1-2 times daily)   Patient is right handed.   Patient has a college education.   Patient has two children.      PHYSICAL EXAM  Vitals:   05/27/17 0807 05/27/17 0814  BP: (!) 99/58 (!) 101/55  Pulse: (!) 54 (!) 57  Weight: 205 lb (93 kg)    Body mass index is 34.11 kg/m.   MMSE - Mini Mental State Exam 05/27/2017 11/26/2016 05/17/2016  Orientation to time 0 3 0  Orientation to Place 2 2 2   Registration 3 3 3   Attention/ Calculation 0 0 0  Recall 1 0 0  Language- name 2 objects 2 2 2   Language- repeat 0 1 1  Language- follow 3 step  command 2 3 3   Language- read & follow direction 1 1 1   Write a sentence 0 1 0  Copy design 0 0 0  Total score 11 16 12      Generalized: Well developed, in no acute distress   Neurological examination  Mentation: Alert. Follows all commands Although instructions has been repeated multiple times. Cranial nerve II-XII: Pupils were equal round reactive to light. Extraocular movements were full, visual field were full on confrontational test. Facial sensation and strength were normal. Uvula tongue midline. Head turning and shoulder shrug  were normal and symmetric. Motor: The motor testing reveals 5 over 5 strength of all 4 extremities. Good symmetric motor tone is noted throughout.  Sensory: Sensory testing is intact to soft touch on all 4 extremities. No evidence of extinction is noted.  Coordination: Cerebellar testing reveals good finger-nose-finger and heel-to-shin bilaterally.  Gait and station: Gait is normal.  Reflexes: Deep tendon reflexes are symmetric and normal bilaterally.   DIAGNOSTIC DATA (LABS, IMAGING, TESTING) - I reviewed patient records, labs, notes, testing and imaging myself where available.      ASSESSMENT AND PLAN 76 y.o. year old female  has a past medical history of Fibroid, uterine; GERD (gastroesophageal reflux disease); Hypertension; Memory loss of unknown cause; and Vitamin D deficiency disease. here with:  1. Memory disturbance  The patient's memory score has remained stable. She will continue on Aricept and Namenda. I have advised the son that they should continue to monitor her symptoms. If they find that she is unsafe to live at home they must make accommodations. Son voiced understanding. If her symptoms worsen or she develops new symptoms they should let us know. She will follow-up in 6 months or sooner if needed.  I spent 15 minutes with the patient. 50% of this time was spent reviewing her memory score.  Ward Givens, MSN, NP-C 05/27/2017, 8:24  AM [facility] [address], [place], [place] [address] [phone]

## 2017-05-27 NOTE — Patient Instructions (Signed)
Your Plan:  Continue Namenda and Aricept  Memory score is stable If your symptoms worsen or you develop new symptoms please let us know.   Thank you for coming to see us at [facility]. I hope we have been able to provide you high quality care today.  You may receive a patient satisfaction survey over the next few weeks. We would appreciate your feedback and comments so that we may continue to improve ourselves and the health of our patients.  

## 2017-06-01 NOTE — Progress Notes (Signed)
Personally  participated in, made any corrections needed, and agree with history, physical, neuro exam,assessment and plan as stated.     Antonia Ahern, MD [facility]     

## 2017-08-22 ENCOUNTER — Emergency Department (HOSPITAL_COMMUNITY): Payer: Medicare Other

## 2017-08-22 ENCOUNTER — Emergency Department (HOSPITAL_COMMUNITY)
Admission: EM | Admit: 2017-08-22 | Discharge: 2017-08-22 | Disposition: A | Discharge status: Home / Self Care | Payer: Medicare Other | Attending: Emergency Medicine | Admitting: Emergency Medicine

## 2017-08-22 DIAGNOSIS — G309 Alzheimer's disease, unspecified: Secondary | ICD-10-CM | POA: Diagnosis not present

## 2017-08-22 DIAGNOSIS — I10 Essential (primary) hypertension: Secondary | ICD-10-CM | POA: Diagnosis not present

## 2017-08-22 DIAGNOSIS — Z79899 Other long term (current) drug therapy: Secondary | ICD-10-CM | POA: Diagnosis not present

## 2017-08-22 DIAGNOSIS — R0789 Other chest pain: Secondary | ICD-10-CM | POA: Diagnosis present

## 2017-08-22 LAB — CBC WITH DIFFERENTIAL/PLATELET
BASOS ABS: 0 10*3/uL (ref 0.0–0.1)
Basophils Relative: 0 %
EOS ABS: 0.1 10*3/uL (ref 0.0–0.7)
EOS PCT: 1 %
HCT: 36.1 % (ref 36.0–46.0)
Hemoglobin: 12.2 g/dL (ref 12.0–15.0)
Lymphocytes Relative: 33 %
Lymphs Abs: 1.6 10*3/uL (ref 0.7–4.0)
MCH: 31.7 pg (ref 26.0–34.0)
MCHC: 33.8 g/dL (ref 30.0–36.0)
MCV: 93.8 fL (ref 78.0–100.0)
Monocytes Absolute: 0.5 10*3/uL (ref 0.1–1.0)
Monocytes Relative: 10 %
Neutro Abs: 2.8 10*3/uL (ref 1.7–7.7)
Neutrophils Relative %: 56 %
PLATELETS: 273 10*3/uL (ref 150–400)
RBC: 3.85 MIL/uL — AB (ref 3.87–5.11)
RDW: 13.6 % (ref 11.5–15.5)
WBC: 5 10*3/uL (ref 4.0–10.5)

## 2017-08-22 LAB — COMPREHENSIVE METABOLIC PANEL
ALT: 14 U/L (ref 14–54)
AST: 20 U/L (ref 15–41)
Albumin: 3.6 g/dL (ref 3.5–5.0)
Alkaline Phosphatase: 66 U/L (ref 38–126)
Anion gap: 8 (ref 5–15)
BUN: 9 mg/dL (ref 6–20)
CHLORIDE: 107 mmol/L (ref 101–111)
CO2: 26 mmol/L (ref 22–32)
CREATININE: 1.1 mg/dL — AB (ref 0.44–1.00)
Calcium: 9 mg/dL (ref 8.9–10.3)
GFR calc non Af Amer: 48 mL/min — ABNORMAL LOW (ref >60)
GFR, EST AFRICAN AMERICAN: 55 mL/min — AB (ref >60)
Glucose, Bld: 95 mg/dL (ref 65–99)
POTASSIUM: 3.7 mmol/L (ref 3.5–5.1)
SODIUM: 141 mmol/L (ref 135–145)
Total Bilirubin: 0.5 mg/dL (ref 0.3–1.2)
Total Protein: 7.1 g/dL (ref 6.5–8.1)

## 2017-08-22 LAB — I-STAT TROPONIN, ED
TROPONIN I, POC: 0 ng/mL (ref 0.00–0.08)
Troponin i, poc: 0.01 ng/mL (ref 0.00–0.08)

## 2017-08-22 LAB — D-DIMER, QUANTITATIVE: D-Dimer, Quant: 0.33 ug/mL-FEU (ref 0.00–0.50)

## 2017-08-22 NOTE — ED Notes (Signed)
Pt helped to dress, given [place] sandwich, diet gingerale and wheeled to car by tech at discharge.

## 2017-08-22 NOTE — Discharge Instructions (Signed)
Need to return immediately if the pain returns or you feel like you will pass out or if you have shortness or breath or other concerns.

## 2017-08-22 NOTE — ED Notes (Signed)
Pt ambulated per Dr. Maryan Rued.  Pt tolerated very well, and denied any chest pain, dizziness, weakness, or lightheadedness.

## 2017-08-22 NOTE — ED Provider Notes (Signed)
[facility] DEPT Provider Note   CSN: [id number] Arrival date & time: 08/22/17  0810     History   Chief Complaint Chief Complaint  Patient presents with  . Chest Pain    HPI Scottlynn ROSELAND BRAUN is a 76 y.o. female.  Patient is a 76 y/o female with a history of dementia and hypertension presenting today from home by EMS with the complaint of chest pain.  Patient states that she woke up this morning and was having a discomfort in the sub-sternal and left portion of her chest that seemed to be worse with taking a deep breath. She is unclear if it was worse with exertion. However she states she continued to feel bad and was having some slight difficulty remembering things she should be able to remember. She denies feeling lightheaded, short of breath or having unilateral leg pain or swelling. When paramedics arrived they stated that patient was extremely diaphoretic. Patient was given aspirin, nitroglycerin and option by EMS with improvement of her symptoms.   The history is provided by the patient.  Chest Pain   This is a new problem. The current episode started 1 to 2 hours ago. The problem occurs constantly. The problem has been gradually improving. Associated with: started when she got up this morning. The pain is present in the lateral region and substernal region. The pain is at a severity of 5/10. The pain is moderate. The quality of the pain is described as sharp and pressure-like. The pain does not radiate. Duration of episode(s) is 2 hours. Associated symptoms include diaphoresis. Pertinent negatives include no abdominal pain, no cough, no fever, no headaches, no irregular heartbeat, no leg pain, no lower extremity edema, no nausea, no shortness of breath, no syncope, no vomiting and no weakness. She has tried nitroglycerin and oxygen (asa) for the symptoms. The treatment provided mild relief. Risk factors include being elderly.  Her past medical history is significant for  hypertension.  Procedure history is negative for stress echo.    Past Medical History:  Diagnosis Date  . Fibroid, uterine   . GERD (gastroesophageal reflux disease)    no meds  . Hypertension   . Memory loss of unknown cause    per pt's admission  . Vitamin D deficiency disease     Patient Active Problem List   Diagnosis Date Noted  . Alzheimer's disease 05/17/2016    Past Surgical History:  Procedure Laterality Date  . CERVIX LESION DESTRUCTION    . COLONOSCOPY  2005  . DILATATION & CURRETTAGE/HYSTEROSCOPY WITH RESECTOCOPE N/A 08/14/2013   Procedure: DILATATION & CURETTAGE/HYSTEROSCOPY WITH RESECTOCOPE;  Surgeon: Marvene Staff, MD;  Location: [facility];  Service: Gynecology;  Laterality: N/A;  . DILATION AND CURETTAGE OF UTERUS    . HYSTEROSCOPY    . KNEE ARTHROCENTESIS  2010  . TUBAL LIGATION    . UTERINE FIBROID SURGERY  2005    OB History    No data available       Home Medications    Prior to Admission medications   Medication Sig Start Date End Date Taking? Authorizing Provider  amLODipine (NORVASC) 10 MG tablet Take 5 mg by mouth daily.     [provider]  benazepril (LOTENSIN) 5 MG tablet Take 5 mg by mouth daily. 05/27/17 unsure of dose    [provider]  Cholecalciferol (VITAMIN D-3) 5000 units TABS Take by mouth.    [provider]  cyanocobalamin 500 MCG tablet Take 500 mcg by  mouth daily.    [provider]  donepezil (ARICEPT) 10 MG tablet Take one tablet daily. 01/23/17   Ward Givens, NP  memantine (NAMENDA) 10 MG tablet Take 1 tablet (10 mg total) by mouth 2 (two) times daily. 02/15/17   Ward Givens, NP  metoprolol-hydrochlorothiazide (LOPRESSOR HCT) 100-25 MG tablet Take 0.5 tablets by mouth daily.    [provider]  vitamin E 400 UNIT capsule Take 400 Units by mouth daily.    [provider]    Family History No family history on file.  Social History Social History  Substance  Use Topics  . Smoking status: Never Smoker  . Smokeless tobacco: Never Used  . Alcohol use Yes     Comment: occasionally,one glass of wine monthly     Allergies   Patient has no known allergies.   Review of Systems Review of Systems  Constitutional: Positive for diaphoresis. Negative for fever.  Respiratory: Negative for cough and shortness of breath.   Cardiovascular: Positive for chest pain. Negative for syncope.  Gastrointestinal: Negative for abdominal pain, nausea and vomiting.  Neurological: Negative for weakness and headaches.  All other systems reviewed and are negative.    Physical Exam Updated Vital Signs There were no vitals taken for this visit.  Physical Exam  Constitutional: She is oriented to person, place, and time. She appears well-developed and well-nourished. No distress.  HENT:  Head: Normocephalic and atraumatic.  Mouth/Throat: Oropharynx is clear and moist.  Eyes: Pupils are equal, round, and reactive to light. Conjunctivae and EOM are normal.  Neck: Normal range of motion. Neck supple.  Cardiovascular: Normal rate, regular rhythm and intact distal pulses.   No murmur heard. Pulmonary/Chest: Effort normal. No respiratory distress. She has wheezes. She has no rales.  occassional expiratory wheeze  Abdominal: Soft. She exhibits no distension. There is no tenderness. There is no rebound and no guarding.  Musculoskeletal: Normal range of motion. She exhibits edema. She exhibits no tenderness.  No calf tenderness.  1+ pitting edema bilaterally  Neurological: She is alert and oriented to person, place, and time.  Skin: Skin is warm and dry. No rash noted. No erythema.  Psychiatric: She has a normal mood and affect. Her behavior is normal.  Nursing note and vitals reviewed.    ED Treatments / Results  Labs (all labs ordered are listed, but only abnormal results are displayed) Labs Reviewed  CBC WITH DIFFERENTIAL/PLATELET - Abnormal; Notable for the  following:       Result Value   RBC 3.85 (*)    All other components within normal limits  COMPREHENSIVE METABOLIC PANEL - Abnormal; Notable for the following:    Creatinine, Ser 1.10 (*)    GFR calc non Af Amer 48 (*)    GFR calc Af Amer 55 (*)    All other components within normal limits  D-DIMER, QUANTITATIVE (NOT AT [facility])  I-STAT TROPONIN, ED  I-STAT TROPONIN, ED    EKG  EKG Interpretation  Date/Time:  Thursday August 22 2017 08:43:44 EDT Ventricular Rate:  53 PR Interval:  160 QRS Duration: 78 QT Interval:  464 QTC Calculation: 435 R Axis:   23 Text Interpretation:  Sinus bradycardia Otherwise normal ECG No significant change since last tracing Confirmed by Blanchie Dessert [phone]) on 08/22/2017 8:47:03 AM       Radiology Dg Chest 2 View  Result Date: 08/22/2017 CLINICAL DATA:  Chest pain EXAM: CHEST  2 VIEW COMPARISON:  09/21/2011 FINDINGS: Cardiac shadow is enlarged  but accentuated by the portable technique. The lungs are hypoinflated but clear. No acute bony abnormality is noted. IMPRESSION: No active cardiopulmonary disease. Electronically Signed   By: Inez Catalina M.D.   On: 08/22/2017 09:46    Procedures Procedures (including critical care time)  Medications Ordered in ED Medications - No data to display   Initial Impression / Assessment and Plan / ED Course  I have reviewed the triage vital signs and the nursing notes.  Pertinent labs & imaging results that were available during my care of the patient were reviewed by me and considered in my medical decision making (see chart for details).    Patient is a 76 year old female presenting with chest pain, diaphoresis by EMS today. Patient does suffer from dementia but unclear how severe it is. She seems to be fairly functional here but is slightly vague when taking a history. She does take blood pressure medication daily which she did take today. She does not recall ever having pain in her chest like this  before. She denies any recent colds or infectious symptoms. She is a nonsmoker and has no prior history of lung disease. She denies any abdominal pain nausea or vomiting. She is currently not diaphoretic and seems to feel better but does state she still has some pain in her left chest. Seems to be exacerbated with breathing. No recent long trips or other risk factors for PE except for age. EKG by EMS and the one done here all are without acute findings. Vital signs are stable. Concern for potential cardiac cause versus PE. Chest x-ray, d-dimer, troponin, CBC, BMP pending  11:42 AM Labs and imaging all wnl.  On repeat evaluation pt is assymptomatic.  Will repeat trop.  1:03 PM Repeat trop wnl.  Patient was able to ambulate around the department today without symptoms. Discussed patient's options with her, caregiver and her son. Gave the option of being admitted for chest pain rule out versus following up with Dr. Carlis Abbott for chest pain rule out. Patient and family are opting to go home. Discussed in length with caregiver symptoms to bring her back for. Attempted to call her doctor's office but they're closed till mon.  Final Clinical Impressions(s) / ED Diagnoses   Final diagnoses:  Atypical chest pain    New Prescriptions New Prescriptions   No medications on file     Blanchie Dessert, MD 08/22/17 1305

## 2017-08-22 NOTE — ED Triage Notes (Signed)
Pt arrives via EMs from home with substernal chest pain radiating to left arm arm she awoke with this AM. Pt denies SOB, N/V/back pain/dizziness. States the pain really isnt that bad right now. Per EMS pt pain free after 1 nitro and 324mg  ASA. 20G LFA. VSS.

## 2017-09-13 ENCOUNTER — Ambulatory Visit (INDEPENDENT_AMBULATORY_CARE_PROVIDER_SITE_OTHER): Payer: Medicare Other

## 2017-09-13 ENCOUNTER — Other Ambulatory Visit: Payer: Self-pay

## 2017-09-13 ENCOUNTER — Ambulatory Visit (HOSPITAL_COMMUNITY): Payer: Medicare Other | Attending: Cardiovascular Disease

## 2017-09-13 ENCOUNTER — Other Ambulatory Visit: Payer: Self-pay | Admitting: Internal Medicine

## 2017-09-13 DIAGNOSIS — R079 Chest pain, unspecified: Secondary | ICD-10-CM | POA: Diagnosis not present

## 2017-09-13 DIAGNOSIS — R0602 Shortness of breath: Secondary | ICD-10-CM | POA: Diagnosis not present

## 2017-09-13 DIAGNOSIS — R0600 Dyspnea, unspecified: Secondary | ICD-10-CM | POA: Diagnosis not present

## 2017-09-13 DIAGNOSIS — I10 Essential (primary) hypertension: Secondary | ICD-10-CM | POA: Diagnosis not present

## 2017-09-13 LAB — EXERCISE TOLERANCE TEST
CHL CUP RESTING HR STRESS: 59 {beats}/min
CSEPEDS: 24 s
CSEPEW: 7 METS
CSEPPHR: 131 {beats}/min
Exercise duration (min): 5 min
MPHR: 144 {beats}/min
Percent HR: 90 %
RPE: 17

## 2017-11-25 ENCOUNTER — Ambulatory Visit: Payer: Medicare Other | Admitting: Neurology

## 2017-11-25 ENCOUNTER — Encounter: Payer: Self-pay | Admitting: Neurology

## 2017-11-25 VITALS — BP 129/74 | HR 54 | Ht 65.0 in | Wt 209.0 lb

## 2017-11-25 DIAGNOSIS — F0390 Unspecified dementia without behavioral disturbance: Secondary | ICD-10-CM | POA: Diagnosis not present

## 2017-11-25 NOTE — Progress Notes (Signed)
[facility]    Provider:  Dr Jaynee Eagles Referring Provider: Foye Spurling, MD Primary Care Physician:  Foye Spurling, MD  CC:  Memory loss  11/25/2017: Here for follow up on dementia. Stable on Namenda and Aricept. She is here with her son. Lives at home with daily aid. No drastic changes. No falls. Patient feels happy and she feels good. Appetite is good. Sleeping is good. Here with son and caretaker. Lives with son and has a caretaker. She goes to a Health visitor 3x a week.   05/27/2017: Martha Mata is a 77 year old female with a history of memory disturbance. She returns today for follow-up. She is here today with her son. She reports that she continues to live at home. She does have caregivers that checks on her frequently. She does stay at home alone at night. She does have a life alert necklace that she can use it needed. Her son denies any episodes of wandering. Patient is able to complete all ADLs independently. Her son reports that there is limited cooking. She does cook bacon and and soups. He denies any trouble using the stove. Her son manages all of her finances. She denies any trouble sleeping. Denies hallucinations. Denies any changes with her mood or behavior. She does use public transportation to go to a connections group. She returns today for an evaluation.    11/26/2016: Martha Mata is a 77 year old female with a history of memory disturbance. She returns today for follow-up. She is here today with her caretaker and son. They report that her memory has remained stable. She continues to live at home alone. She does have caregivers that check on her throughout the day. She reports that she is able to complete most ADLs independently. She does prepare her own meals without difficulty. Her son helps her with her finances. She does not operate a motor vehicle. Denies any trouble sleeping. Denies getting up at night wandering around the house. Denies  hallucinations. Denies restlessness or agitation. Overall she feels that she is doing well. Family agrees with this. She is on Aricept and tolerating it well. She returns today for an evaluation.    HPI:  Martha Mata is a 77 y.o. female here as a referral from Dr. Carlis Abbott for memory loss. Here with caretaker who provides information. She is here as a follow up.  Memory changes started about 3 years ago. Slowly progressive, slowly getting worse. Started with short-term memory problems, repeating conversations. Caretaker says she loses her keys, she had to put a key rack in her room, she forgets things she is told, asks the same questions over and over again, forgets conversations, she cooks quick things doesn't cook big meals anymore. Caretaker is there a few hours a day. She lives alone in a condo. Caretaker does the driving, no accidents in the home, no falls. Son keep a camera in the house to watch her and make sure she is safe, he lives in [place]. Discussed assisted living close to son, recommended that she look into it. No hallucinations, agitation, no issues at all. No depression, no behavioral problems, she is still very social, goes to the [employer] weekly. Son has taken over finances and caring for the house in the last few years. She takes OTC b12. TSH has been normal in the past.   Reviewed notes, labs and imaging from outside physicians, which showed:  CBC nml, CMP with creatinine is 0.86 normal labs were taken 03/06/2016,  LDL 104. TSH in the past has been normal. Patient takes daily B12 supplementation so we'll not check B12.  MRI of the brain 09/07/2013 (personally reviewed imaging and agree with the following):  Mildly abnormal MRI brain (without) demonstrating: 1. Few scattered foci of non-specific gliosis in the subcortical and juxtacortical white matter.  2. Single left occipital punctate focus of SWI hypointensity, may represent a cerebral microhemorrhage. 3. Above  findings may be related to underlying mild chronic small vessel ischemic disease.   Social History   Socioeconomic History  . Marital status: Widowed    Spouse name: Not on file  . Number of children: 2  . Years of education: 12+  . Highest education level: Not on file  Social Needs  . Financial resource strain: Not on file  . Food insecurity - worry: Not on file  . Food insecurity - inability: Not on file  . Transportation needs - medical: Not on file  . Transportation needs - non-medical: Not on file  Occupational History  . Occupation: Retired  Tobacco Use  . Smoking status: Never Smoker  . Smokeless tobacco: Never Used  Substance and Sexual Activity  . Alcohol use: Yes    Comment: occasionally,one glass of wine monthly  . Drug use: No  . Sexual activity: Not on file  Other Topics Concern  . Not on file  Social History Narrative   Patient is retired.    Patient is a non-smoker    Patient lives at home alone. Has a caretaker who comes in to help as needed.   Patient consumes tea and coffee (1-2 cups daily)   Patient is right handed.   Patient has a college education.   Patient has two children.    History reviewed. No pertinent family history.  Past Medical History:  Diagnosis Date  . Fibroid, uterine   . GERD (gastroesophageal reflux disease)    no meds  . Hypertension   . Memory loss of unknown cause    per pt's admission  . Vitamin D deficiency disease     Past Surgical History:  Procedure Laterality Date  . CERVIX LESION DESTRUCTION    . COLONOSCOPY  2005  . DILATATION & CURRETTAGE/HYSTEROSCOPY WITH RESECTOCOPE N/A 08/14/2013   Procedure: DILATATION & CURETTAGE/HYSTEROSCOPY WITH RESECTOCOPE;  Surgeon: Marvene Staff, MD;  Location: [facility];  Service: Gynecology;  Laterality: N/A;  . DILATION AND CURETTAGE OF UTERUS    . HYSTEROSCOPY    . KNEE ARTHROCENTESIS  2010  . TUBAL LIGATION    . UTERINE FIBROID SURGERY  2005    Current Outpatient  Medications  Medication Sig Dispense Refill  . amLODipine (NORVASC) 10 MG tablet Take 5 mg by mouth daily.     . benazepril (LOTENSIN) 5 MG tablet Take 5 mg by mouth daily. 05/27/17 unsure of dose    . Cholecalciferol (VITAMIN D-3) 5000 units TABS Take by mouth.    . cyanocobalamin 500 MCG tablet Take 500 mcg by mouth daily.    Marland Kitchen donepezil (ARICEPT) 10 MG tablet Take one tablet daily. 30 tablet 11  . memantine (NAMENDA) 10 MG tablet Take 1 tablet (10 mg total) by mouth 2 (two) times daily. 180 tablet 3  . metoprolol-hydrochlorothiazide (LOPRESSOR HCT) 100-25 MG tablet Take 1 tablet by mouth daily.     . vitamin E 400 UNIT capsule Take 400 Units by mouth daily.     No current facility-administered medications for this visit.     Allergies as of 11/25/2017  . (  No Known Allergies)    Vitals: BP 129/74 (BP Location: Right Arm, Patient Position: Sitting)   Pulse (!) 54   Ht 5\' 5"  (1.651 m)   Wt 209 lb (94.8 kg)   BMI 34.78 kg/m  Last Weight:  Wt Readings from Last 1 Encounters:  11/25/17 209 lb (94.8 kg)   Last Height:   Ht Readings from Last 1 Encounters:  11/25/17 5\' 5"  (1.651 m)   MMSE - Mini Mental State Exam 05/27/2017 11/26/2016 05/17/2016  Orientation to time 0 3 0  Orientation to Place 2 2 2   Registration 3 3 3   Attention/ Calculation 0 0 0  Recall 1 0 0  Language- name 2 objects 2 2 2   Language- repeat 0 1 1  Language- follow 3 step command 2 3 3   Language- read & follow direction 1 1 1   Write a sentence 0 1 0  Copy design 0 0 0  Total score 11 16 12     Very lovely 41 year year-old likely Alzheimer's dementia. Any mental status exam today is 12 out of 30 which is consistent with significant cognitive deficits. Patient however looks great, and she denies depression or behavioral issues, she has a caregiver daily, otherwise she is very functional. She has a son in [place] he keeps close watch on her and takes care of all the household bills and maintenance. I did  discuss the possibility of moving closer to her son and is listed living at some point and advised him to take a look at that. We'll start her on Aricept and at the next appointment [place].  Continue aricept and namenda  Sarina Ill, MD  [facility] [address] [place], [place] [address]  Phone [phone] Fax [phone]  A total of 15 minutes was spent in with this patient. Over half this time was spent on counseling patient on the dementia diagnosis and different therapeutic options available.

## 2017-12-17 ENCOUNTER — Other Ambulatory Visit: Payer: Self-pay | Admitting: Adult Health

## 2018-02-16 ENCOUNTER — Other Ambulatory Visit: Payer: Self-pay | Admitting: Adult Health

## 2018-03-12 ENCOUNTER — Encounter: Payer: Self-pay | Admitting: Neurology

## 2018-04-08 ENCOUNTER — Encounter: Payer: Self-pay | Admitting: Neurology

## 2018-04-25 ENCOUNTER — Encounter

## 2018-04-25 ENCOUNTER — Other Ambulatory Visit (INDEPENDENT_AMBULATORY_CARE_PROVIDER_SITE_OTHER): Payer: Medicare Other

## 2018-04-25 ENCOUNTER — Encounter: Payer: Self-pay | Admitting: Nurse Practitioner

## 2018-04-25 ENCOUNTER — Ambulatory Visit (INDEPENDENT_AMBULATORY_CARE_PROVIDER_SITE_OTHER): Payer: Medicare Other | Admitting: Nurse Practitioner

## 2018-04-25 VITALS — BP 110/60 | HR 61 | Temp 97.8°F | Ht 65.0 in | Wt 216.8 lb

## 2018-04-25 DIAGNOSIS — Z23 Encounter for immunization: Secondary | ICD-10-CM

## 2018-04-25 DIAGNOSIS — I10 Essential (primary) hypertension: Secondary | ICD-10-CM | POA: Insufficient documentation

## 2018-04-25 DIAGNOSIS — I159 Secondary hypertension, unspecified: Secondary | ICD-10-CM

## 2018-04-25 LAB — COMPREHENSIVE METABOLIC PANEL
ALT: 12 U/L (ref 0–35)
AST: 15 U/L (ref 0–37)
Albumin: 3.8 g/dL (ref 3.5–5.2)
Alkaline Phosphatase: 67 U/L (ref 39–117)
BILIRUBIN TOTAL: 0.3 mg/dL (ref 0.2–1.2)
BUN: 15 mg/dL (ref 6–23)
CALCIUM: 9.6 mg/dL (ref 8.4–10.5)
CHLORIDE: 104 meq/L (ref 96–112)
CO2: 29 mEq/L (ref 19–32)
CREATININE: 1.07 mg/dL (ref 0.40–1.20)
GFR: 63.85 mL/min (ref >60.00)
GLUCOSE: 108 mg/dL — AB (ref 70–99)
Potassium: 3.5 mEq/L (ref 3.5–5.1)
SODIUM: 143 meq/L (ref 135–145)
Total Protein: 7.6 g/dL (ref 6.0–8.3)

## 2018-04-25 LAB — LIPID PANEL
Cholesterol: 177 mg/dL (ref 0–200)
HDL: 42.8 mg/dL (ref >39.00)
LDL Cholesterol: 107 mg/dL — ABNORMAL HIGH (ref 0–99)
NONHDL: 134.38
Total CHOL/HDL Ratio: 4
Triglycerides: 136 mg/dL (ref 0.0–149.0)
VLDL: 27.2 mg/dL (ref 0.0–40.0)

## 2018-04-25 LAB — CBC
HEMATOCRIT: 36.1 % (ref 36.0–46.0)
Hemoglobin: 12.1 g/dL (ref 12.0–15.0)
MCHC: 33.4 g/dL (ref 30.0–36.0)
MCV: 94.6 fl (ref 78.0–100.0)
Platelets: 307 10*3/uL (ref 150.0–400.0)
RBC: 3.82 Mil/uL — ABNORMAL LOW (ref 3.87–5.11)
RDW: 14.3 % (ref 11.5–15.5)
WBC: 5.7 10*3/uL (ref 4.0–10.5)

## 2018-04-25 LAB — TSH: TSH: 1.15 u[IU]/mL (ref 0.35–4.50)

## 2018-04-25 NOTE — Assessment & Plan Note (Signed)
BP is stable, on lower end, but she denies any complaints We will continue current meds If her blood pressure remains on the lower end at follow ups, we could consider decreasing medication dosages - CBC; Future - Comprehensive metabolic panel; Future - TSH; Future - Lipid panel; Future

## 2018-04-25 NOTE — Progress Notes (Signed)
Name: Martha Mata   MRN: [id number]    DOB: Aug 31, 1941   Date:04/25/2018       Progress Note  Subjective  Chief Complaint  Establish care  HPI Martha Mata is establishing care with me today as a new patient to our practice. Her prior PCP recently retired. She is an independent 77 yo with alzheimers dementia, does remain at home with frequent checks by caregivers and children. She is routinely followed by Neurology for alzheimer management. She denies any complaints today. We will discuss hypertension and follow up on routine health maintenance.  Hypertension -maintained on amlodipdine 5, benazpril 5, HCTZ 25, lopressor 100 BID. She says she has been maintained on these medications at these dosages for quite some time Reports daily medication compliance without noted adverse medication effects. Reports she does not routinely check her blood pressure readings at home Denies weakness, dizziness, headaches, vision changes, chest pain, shortness of breath. She says that she is eating and sleeping well and denies any complaints today  BP Readings from Last 3 Encounters:  04/25/18 110/60  11/25/17 129/74  08/22/17 121/62     Patient Active Problem List   Diagnosis Date Noted  . Alzheimer's disease 05/17/2016    Past Surgical History:  Procedure Laterality Date  . CERVIX LESION DESTRUCTION    . COLONOSCOPY  2005  . DILATATION & CURRETTAGE/HYSTEROSCOPY WITH RESECTOCOPE N/A 08/14/2013   Procedure: DILATATION & CURETTAGE/HYSTEROSCOPY WITH RESECTOCOPE;  Surgeon: Marvene Staff, MD;  Location: [facility];  Service: Gynecology;  Laterality: N/A;  . DILATION AND CURETTAGE OF UTERUS    . HYSTEROSCOPY    . KNEE ARTHROCENTESIS  2010  . TUBAL LIGATION    . UTERINE FIBROID SURGERY  2005    History reviewed. No pertinent family history.  Social History   Socioeconomic History  . Marital status: Widowed    Spouse name: Not on file  . Number of children: 2  . Years of education: 12+   . Highest education level: Not on file  Occupational History  . Occupation: Retired  Scientific laboratory technician  . Financial resource strain: Not on file  . Food insecurity:    Worry: Not on file    Inability: Not on file  . Transportation needs:    Medical: Not on file    Non-medical: Not on file  Tobacco Use  . Smoking status: Never Smoker  . Smokeless tobacco: Never Used  Substance and Sexual Activity  . Alcohol use: Yes    Comment: occasionally,one glass of wine monthly  . Drug use: No  . Sexual activity: Not on file  Lifestyle  . Physical activity:    Days per week: Not on file    Minutes per session: Not on file  . Stress: Not on file  Relationships  . Social connections:    Talks on phone: Not on file    Gets together: Not on file    Attends religious service: Not on file    Active member of club or organization: Not on file    Attends meetings of clubs or organizations: Not on file    Relationship status: Not on file  . Intimate partner violence:    Fear of current or ex partner: Not on file    Emotionally abused: Not on file    Physically abused: Not on file    Forced sexual activity: Not on file  Other Topics Concern  . Not on file  Social History Narrative   Patient is retired.  Patient is a non-smoker    Patient lives at home alone. Has a caretaker who comes in to help as needed.   Patient consumes tea and coffee (1-2 cups daily)   Patient is right handed.   Patient has a college education.   Patient has two children.     Current Outpatient Medications:  .  amLODipine (NORVASC) 10 MG tablet, Take 5 mg by mouth daily. , Disp: , Rfl:  .  benazepril (LOTENSIN) 5 MG tablet, Take 5 mg by mouth daily. 05/27/17 unsure of dose, Disp: , Rfl:  .  Cholecalciferol (VITAMIN D-3) 5000 units TABS, Take by mouth., Disp: , Rfl:  .  cyanocobalamin 500 MCG tablet, Take 500 mcg by mouth daily., Disp: , Rfl:  .  donepezil (ARICEPT) 10 MG tablet, TAKE 1 TABLET BY MOUTH EVERY DAY,  Disp: 90 tablet, Rfl: 3 .  hydrochlorothiazide (HYDRODIURIL) 25 MG tablet, Take 25 mg by mouth daily., Disp: , Rfl:  .  memantine (NAMENDA) 10 MG tablet, TAKE 1 TABLET (10 MG TOTAL) BY MOUTH 2 (TWO) TIMES DAILY., Disp: 180 tablet, Rfl: 3 .  metoprolol tartrate (LOPRESSOR) 100 MG tablet, Take 100 mg by mouth 2 (two) times daily., Disp: , Rfl:  .  vitamin E 400 UNIT capsule, Take 400 Units by mouth daily., Disp: , Rfl:   No Known Allergies   Review of Systems  Constitutional: Negative for weight loss.  Eyes: Negative for blurred vision and double vision.  Respiratory: Negative for shortness of breath.   Cardiovascular: Negative for chest pain and leg swelling.  Gastrointestinal: Negative for nausea and vomiting.  Musculoskeletal: Negative for falls.  Neurological: Negative for headaches.  Psychiatric/Behavioral: Positive for memory loss.     Objective  Vitals:   04/25/18 1332  BP: 110/60  Pulse: 61  Temp: 97.8 F (36.6 C)  TempSrc: Oral  SpO2: 98%  Weight: 216 lb 12 oz (98.3 kg)  Height: 5\' 5"  (1.651 m)   Body mass index is 36.07 kg/m.  Physical Exam Vital signs reviewed. Constitutional: Patient appears well-developed and well-nourished. No distress.  HENT: Head: Normocephalic and atraumatic. Nose: Nose normal. Mouth/Throat: Oropharynx is clear and moist. No oropharyngeal exudate.  Eyes: Conjunctivae and EOM are normal. Pupils are equal, round, and reactive to light. No scleral icterus.  Neck: Normal range of motion. Neck supple.  Cardiovascular: Normal rate, regular rhythm and normal heart sounds.  No murmur heard. Scant BLE edema. Distal pulses intact. Pulmonary/Chest: Effort normal and breath sounds normal. No respiratory distress. Musculoskeletal: Normal range of motion. No gross deformities Neurological: She is alert and oriented to person, place, and time.  Coordination, balance, strength, speech and gait are normal.  Skin: Skin is warm and dry. No rash noted. No  erythema.  Psychiatric: Patient has a normal mood and affect. behavior is normal. Judgment and thought content normal.   PHQ2/9: Depression screen PHQ 2/9 04/25/2018  Decreased Interest 0  Down, Depressed, Hopeless 0  PHQ - 2 Score 0    Fall Risk: Fall Risk  04/25/2018  Falls in the past year? No    Assessment & Plan F/U TBD pending lab results  -Reviewed Health Maintenance:  Need for Tdap vaccination- Tdap vaccine greater than or equal to 7yo IM Need for pneumococcal vaccination- Pneumococcal conjugate vaccine 13-valent

## 2018-04-25 NOTE — Patient Instructions (Signed)
Please head downstairs for lab work/x-rays. If any of your test results are critically abnormal, you will be contacted right away. Your results may be released to your MyChart for viewing before I am able to provide you with my response. I will contact you within a week about your test results and any recommendations for abnormalities.  We will decide when you should come back and see me when I get your labs back.  It was nice to meet you. Welcome to [facility]!

## 2018-07-28 ENCOUNTER — Encounter: Payer: Self-pay | Admitting: Nurse Practitioner

## 2018-08-06 ENCOUNTER — Other Ambulatory Visit: Payer: Medicare Other

## 2018-08-06 ENCOUNTER — Encounter: Payer: Self-pay | Admitting: Nurse Practitioner

## 2018-08-06 ENCOUNTER — Ambulatory Visit (INDEPENDENT_AMBULATORY_CARE_PROVIDER_SITE_OTHER): Payer: Medicare Other | Admitting: Nurse Practitioner

## 2018-08-06 VITALS — BP 120/80 | HR 75 | Ht 65.0 in | Wt 215.0 lb

## 2018-08-06 DIAGNOSIS — R350 Frequency of micturition: Secondary | ICD-10-CM

## 2018-08-06 DIAGNOSIS — Z23 Encounter for immunization: Secondary | ICD-10-CM

## 2018-08-06 DIAGNOSIS — R2231 Localized swelling, mass and lump, right upper limb: Secondary | ICD-10-CM

## 2018-08-06 LAB — POCT URINALYSIS DIPSTICK
Bilirubin, UA: NEGATIVE
Blood, UA: NEGATIVE
GLUCOSE UA: NEGATIVE
KETONES UA: NEGATIVE
NITRITE UA: NEGATIVE
Protein, UA: NEGATIVE
Spec Grav, UA: 1.015 (ref 1.010–1.025)
Urobilinogen, UA: 0.2 E.U./dL
pH, UA: 7 (ref 5.0–8.0)

## 2018-08-06 MED ORDER — NITROFURANTOIN MONOHYD MACRO 100 MG PO CAPS: 100.0000 mg | ORAL_CAPSULE | Freq: Two times a day (BID) | ORAL | 0 refills | Status: DC

## 2018-08-06 NOTE — Progress Notes (Signed)
Name: Martha Mata   MRN: [id number]    DOB: [birth year]   Date:08/06/2018       Progress Note  Subjective  Chief Complaint  HPI Martha Mata is here today with her caregiver for evaluation of two acute complaints: right underarm mass, urinary frequency. Despite dementia, Martha Mata continues to live at home independently with caregiver 5 days per week and also attends adult daycare daily from 10-2. Her caregiver recently noticed "fatty tissue" hanging from her right underarm. There is no redness, drainage or pain. Her caregiver also says daycare staff told her famliy shes had increased urinary frequency over past few weeks, going to the restroom to urinate 4-5 times during her four hour classes some days. She denies any fevers, chills, abdominal pain, dysuria, hematuria, incontinence.   Patient Active Problem List   Diagnosis Date Noted  . Hypertension 04/25/2018  . Alzheimer's disease 05/17/2016    Social History   Tobacco Use  . Smoking status: Never Smoker  . Smokeless tobacco: Never Used  Substance Use Topics  . Alcohol use: Yes    Comment: occasionally,one glass of wine monthly     Current Outpatient Medications:  .  amLODipine (NORVASC) 10 MG tablet, Take 5 mg by mouth daily. , Disp: , Rfl:  .  benazepril (LOTENSIN) 5 MG tablet, Take 5 mg by mouth daily. 05/27/17 unsure of dose, Disp: , Rfl:  .  Cholecalciferol (VITAMIN D-3) 5000 units TABS, Take by mouth., Disp: , Rfl:  .  cyanocobalamin 500 MCG tablet, Take 500 mcg by mouth daily., Disp: , Rfl:  .  donepezil (ARICEPT) 10 MG tablet, TAKE 1 TABLET BY MOUTH EVERY DAY, Disp: 90 tablet, Rfl: 3 .  hydrochlorothiazide (HYDRODIURIL) 25 MG tablet, Take 25 mg by mouth daily., Disp: , Rfl:  .  memantine (NAMENDA) 10 MG tablet, TAKE 1 TABLET (10 MG TOTAL) BY MOUTH 2 (TWO) TIMES DAILY., Disp: 180 tablet, Rfl: 3 .  metoprolol tartrate (LOPRESSOR) 100 MG tablet, Take 100 mg by mouth 2 (two) times daily., Disp: , Rfl:  .  vitamin E 400  UNIT capsule, Take 400 Units by mouth daily., Disp: , Rfl:   No Known Allergies  ROS  No other specific complaints in a complete review of systems (except as listed in HPI above).  Objective  Vitals:   08/06/18 1333  BP: 120/80  Pulse: 75  SpO2: 98%  Weight: 215 lb (97.5 kg)  Height: 5\' 5"  (1.651 m)    Body mass index is 35.78 kg/m.  Nursing Note and Vital Signs reviewed.  Physical Exam  Constitutional: Patient appears well-developed and well-nourished.  No distress.  HEENT: head atraumatic, normocephalic, pupils equal and reactive to light, EOM's intact, neck supple, oropharynx pink and moist without exudate Cardiovascular: Normal rate, regular rhythm, normal hear sounds. Distal pulses intact. Pulmonary/Chest: Effort normal.  No respiratory distress or retractions. Abdominal: Soft, no distension.   Musculoskeletal: Normal range of motion,  No gross deformities.  Neurological: She is alert and oriented to person, place. No cranial nerve deficit. Coordination, balance, strength, speech and gait are normal.  Skin: Skin is warm and dry. No rash noted. Palpable, moveable, soft subcutaneous mass to right axilla without tenderness, erythema or drainage. Psychiatric: Patient has a normal mood and affect. behavior is normal. Impaired recent and remote memory.  Assessment & Plan  -Reviewed Health Maintenance: Need for influenza vaccination- Flu vaccine HIGH DOSE PF  Axillary mass, right Imaging ordered for further evaluation F/U with further recommendations pending  test results - MM DIAG BREAST TOMO BILATERAL; Future - US BREAST LTD UNI RIGHT INC AXILLA; Future  Urinary frequency Urine today is normal aside from + leukocytes, will start antibiotic course for possible UTI and send culture Macrobid sent- dosing and side effects discussed F/U with further recommendations pending lab results -Red flags and when to present for emergency care or RTC including fever >101.4F,   new/worsening/un-resolving symptoms, reviewed with patient and provided in AVS. - nitrofurantoin, macrocrystal-monohydrate, (MACROBID) 100 MG capsule; Take 1 capsule (100 mg total) by mouth 2 (two) times daily.  Dispense: 14 capsule; Refill: 0

## 2018-08-06 NOTE — Patient Instructions (Addendum)
Please start macrobid 100mg  twice daily for 7 days. We will send your urine for a culture and follow up when this returns with any further recommendations  I have placed an order for mammogram with ultrasound of your right axilla. Our office will begin processing this referral. Please follow up if you have not heard anything about this referral within 10 days.    Urinary Frequency, Adult Urinary frequency means urinating more often than usual. People with urinary frequency urinate at least 8 times in 24 hours, even if they drink a normal amount of fluid. Although they urinate more often than normal, the total amount of urine produced in a day may be normal. Urinary frequency is also called pollakiuria. What are the causes? This condition may be caused by:  A urinary tract infection.  Obesity.  Bladder problems, such as bladder stones.  Caffeine or alcohol.  Eating food or drinking fluids that irritate the bladder. These include coffee, tea, soda, artificial sweeteners, citrus, tomato-based foods, and chocolate.  Certain medicines, such as medicines that help the body get rid of extra fluid (diuretics).  Muscle or nerve weakness.  Overactive bladder.  Chronic diabetes.  Interstitial cystitis.  In men, problems with the prostate, such as an enlarged prostate.  In women, pregnancy.  In some cases, the cause may not be known. What increases the risk? This condition is more likely to develop in:  Women who have gone through menopause.  Men with prostate problems.  People with a disease or injury that affects the nerves or spinal cord.  People who have or have had a condition that affects the brain, such as a stroke.  What are the signs or symptoms? Symptoms of this condition include:  Feeling an urgent need to urinate often. The stress and anxiety of needing to find a bathroom quickly can make this urge worse.  Urinating 8 or more times in 24 hours.  Urinating as  often as every 1 to 2 hours.  How is this diagnosed? This condition is diagnosed based on your symptoms, your medical history, and a physical exam. You may have tests, such as:  Blood tests.  Urine tests.  Imaging tests, such as X-rays or ultrasounds.  A bladder test.  A test of your neurological system. This is the body system that senses the need to urinate.  A test to check for problems in the urethra and bladder called cystoscopy.  You may also be asked to keep a bladder diary. A bladder diary is a record of what you eat and drink, how often you urinate, and how much you urinate. You may need to see a health care provider who specializes in conditions of the urinary tract (urologist) or kidneys (nephrologist). How is this treated? Treatment for this condition depends on the cause. Sometimes the condition goes away on its own and treatment is not necessary. If treatment is needed, it may include:  Taking medicine.  Learning exercises that strengthen the muscles that help control urination.  Following a bladder training program. This may include: ? Learning to delay going to the bathroom. ? Double urinating (voiding). This helps if you are not completely emptying your bladder. ? Scheduled voiding.  Making diet changes, such as: ? Avoiding caffeine. ? Drinking fewer fluids, especially alcohol. ? Not drinking in the evening. ? Not having foods or drinks that may irritate the bladder. ? Eating foods that help prevent or ease constipation. Constipation can make this condition worse.  Having the nerves  in your bladder stimulated. There are two options for stimulating the nerves to your bladder: ? Outpatient electrical nerve stimulation. This is done by your health care provider. ? Surgery to implant a bladder pacemaker. The pacemaker helps to control the urge to urinate.  Follow these instructions at home:  Keep a bladder diary if told to by your health care provider.  Take  over-the-counter and prescription medicines only as told by your health care provider.  Do any exercises as told by your health care provider.  Follow a bladder training program as told by your health care provider.  Make any recommended diet changes.  Keep all follow-up visits as told by your health care provider. This is important. Contact a health care provider if:  You start urinating more often.  You feel pain or irritation when you urinate.  You notice blood in your urine.  Your urine looks cloudy.  You develop a fever.  You begin vomiting. Get help right away if:  You are unable to urinate. This information is not intended to replace advice given to you by your health care provider. Make sure you discuss any questions you have with your health care provider. Document Released: 09/01/2009 Document Revised: 12/07/2015 Document Reviewed: 06/01/2015 Elsevier Interactive Patient Education  [employer].

## 2018-08-07 LAB — URINE CULTURE
MICRO NUMBER:: 91120301
SPECIMEN QUALITY:: ADEQUATE

## 2018-08-19 ENCOUNTER — Encounter: Payer: Self-pay | Admitting: Nurse Practitioner

## 2018-08-28 ENCOUNTER — Ambulatory Visit
Admission: RE | Admit: 2018-08-28 | Discharge: 2018-08-28 | Disposition: A | Discharge status: Home / Self Care | Payer: Medicare Other | Source: Ambulatory Visit | Attending: Nurse Practitioner | Admitting: Nurse Practitioner

## 2018-08-28 DIAGNOSIS — R2231 Localized swelling, mass and lump, right upper limb: Secondary | ICD-10-CM

## 2018-09-25 ENCOUNTER — Encounter: Payer: Self-pay | Admitting: Nurse Practitioner

## 2018-09-25 ENCOUNTER — Other Ambulatory Visit: Payer: Self-pay | Admitting: *Deleted

## 2018-09-25 MED ORDER — BENAZEPRIL HCL 5 MG PO TABS: 5.0000 mg | ORAL_TABLET | Freq: Every day | ORAL | 1 refills | Status: DC

## 2018-09-29 ENCOUNTER — Encounter: Payer: Self-pay | Admitting: Nurse Practitioner

## 2018-09-29 ENCOUNTER — Other Ambulatory Visit: Payer: Self-pay | Admitting: *Deleted

## 2018-09-29 MED ORDER — HYDROCHLOROTHIAZIDE 25 MG PO TABS: 25.0000 mg | ORAL_TABLET | Freq: Every day | ORAL | 1 refills | Status: DC

## 2018-10-01 ENCOUNTER — Other Ambulatory Visit: Payer: Self-pay | Admitting: Nurse Practitioner

## 2018-10-07 ENCOUNTER — Encounter: Payer: Self-pay | Admitting: Nurse Practitioner

## 2018-10-15 MED ORDER — METOPROLOL TARTRATE 100 MG PO TABS: 100.0000 mg | ORAL_TABLET | Freq: Two times a day (BID) | ORAL | 1 refills | Status: DC

## 2018-10-15 NOTE — Telephone Encounter (Signed)
Copied from [place] [id number]. Topic: Quick Communication - Rx Refill/Question >> Oct 15, 2018  9:33 AM Sheppard Coil, [employer] L wrote: Medication:  metoprolol tartrate (LOPRESSOR) 100 MG tablet donepezil (ARICEPT) 10 MG tablet  Pt sent in a MyChart request, but hasn't heard anything.  Pt is out of the metoprolol and is also in need of donepezil.  Pt's son is the one checking on this and he would like a call when this has been completed at [phone].  Preferred Pharmacy (with phone number or street name): [facility] [place], [place] [address] [phone] (Phone) [phone] (Fax)  Agent: Please be advised that RX refills may take up to 3 business days. We ask that you follow-up with your pharmacy.

## 2018-10-15 NOTE — Telephone Encounter (Signed)
Son sent mychart msg as well responded back stating per mom chart both meds was refilled on 10/01/18. [facility].Marland KitchenJohny Chess

## 2018-10-31 ENCOUNTER — Ambulatory Visit: Payer: Medicare Other | Admitting: Nurse Practitioner

## 2018-11-06 ENCOUNTER — Ambulatory Visit (INDEPENDENT_AMBULATORY_CARE_PROVIDER_SITE_OTHER): Payer: Medicare Other | Admitting: Nurse Practitioner

## 2018-11-06 ENCOUNTER — Encounter: Payer: Self-pay | Admitting: Nurse Practitioner

## 2018-11-06 VITALS — BP 120/62 | HR 55 | Ht 65.0 in | Wt 209.0 lb

## 2018-11-06 DIAGNOSIS — I10 Essential (primary) hypertension: Secondary | ICD-10-CM | POA: Diagnosis not present

## 2018-11-06 NOTE — Assessment & Plan Note (Signed)
BP remains stable on current regimen, continue RTC in 6 months for CPE, recheck BP

## 2018-11-06 NOTE — Patient Instructions (Signed)
I will see you back for annual physical with fasting labs  It was good to see you today!

## 2018-11-06 NOTE — Progress Notes (Signed)
Martha Mata is a 77 y.o. female with the following history as recorded in EpicCare:  Patient Active Problem List   Diagnosis Date Noted  . Hypertension 04/25/2018  . Alzheimer's disease ([place]) 05/17/2016    Current Outpatient Medications  Medication Sig Dispense Refill  . amLODipine (NORVASC) 5 MG tablet TAKE 1 TABLET BY MOUTH EVERY DAY 90 tablet 2  . benazepril (LOTENSIN) 5 MG tablet Take 1 tablet (5 mg total) by mouth daily. 90 tablet 1  . Cholecalciferol (VITAMIN D-3) 5000 units TABS Take by mouth.    . cyanocobalamin 500 MCG tablet Take 500 mcg by mouth daily.    Marland Kitchen donepezil (ARICEPT) 10 MG tablet TAKE 1 TABLET BY MOUTH EVERY DAY 90 tablet 3  . hydrochlorothiazide (HYDRODIURIL) 25 MG tablet Take 1 tablet (25 mg total) by mouth daily. 90 tablet 1  . memantine (NAMENDA) 10 MG tablet TAKE 1 TABLET (10 MG TOTAL) BY MOUTH 2 (TWO) TIMES DAILY. 180 tablet 3  . metoprolol tartrate (LOPRESSOR) 100 MG tablet Take 1 tablet (100 mg total) by mouth 2 (two) times daily. 180 tablet 1  . vitamin E 400 UNIT capsule Take 400 Units by mouth daily.     No current facility-administered medications for this visit.     Allergies: Patient has no known allergies.  Past Medical History:  Diagnosis Date  . Fibroid, uterine   . GERD (gastroesophageal reflux disease)    no meds  . Hypertension   . Memory loss of unknown cause    per pt's admission  . Vitamin D deficiency disease     Past Surgical History:  Procedure Laterality Date  . CERVIX LESION DESTRUCTION    . COLONOSCOPY  2005  . DILATATION & CURRETTAGE/HYSTEROSCOPY WITH RESECTOCOPE N/A 08/14/2013   Procedure: DILATATION & CURETTAGE/HYSTEROSCOPY WITH RESECTOCOPE;  Surgeon: Marvene Staff, MD;  Location: [facility];  Service: Gynecology;  Laterality: N/A;  . DILATION AND CURETTAGE OF UTERUS    . HYSTEROSCOPY    . KNEE ARTHROCENTESIS  2010  . TUBAL LIGATION    . UTERINE FIBROID SURGERY  2005    History reviewed. No pertinent family  history.  Social History   Tobacco Use  . Smoking status: Never Smoker  . Smokeless tobacco: Never Used  Substance Use Topics  . Alcohol use: Yes    Comment: occasionally,one glass of wine monthly     Subjective:  [name] is here today for routine follow up, accompanied by caregiver, I first met her 04/25/18 to establish care and had her return in 6 months. She is without any complaints today she has continued amlodipdine 5, benazpril 5, HCTZ 25, lopressor 100 BID daily for her blood pressure and feels she is tolerating the medications well. Reports she does not regularly check BP readings at home Denies syncope, weakness, headaches, vision changes, chest pain, shortness of breath, edema.  BP Readings from Last 3 Encounters:  11/06/18 120/62  08/06/18 120/80  04/25/18 110/60    ROS - See HPI  Objective:  Vitals:   11/06/18 1316  BP: 120/62  Pulse: (!) 55  SpO2: 96%  Weight: 209 lb (94.8 kg)  Height: _0  (1.651 m)    General: Well developed, well nourished, in no acute distress  Skin : Warm and dry.  Head: Normocephalic and atraumatic  Eyes: Sclera and conjunctiva clear; pupils round and reactive to light; extraocular movements intact  Oropharynx: Pink, supple. No suspicious lesions  Neck: Supple Lungs: Respirations unlabored; clear to auscultation  bilaterally without wheeze, rales, rhonchi  [facility] exam: normal rate, regular rhythm, normal S1, S2, no murmurs, rubs, clicks or gallops.  Extremities: No edema, cyanosis Vessels: Symmetric bilaterally  Neurologic: Alert and oriented; speech intact; face symmetrical; moves all extremities well; CNII-XII intact without focal deficit  Psychiatric: Normal mood and affect.   Assessment:  1. Hypertension, unspecified type     Plan:  04/25/18 CBC, CMET, TSH, lipid panel stable  Return in about 6 months (around 05/08/2019) for cpe.  No orders of the defined types were placed in this encounter.   Requested Prescriptions    No  prescriptions requested or ordered in this encounter

## 2018-11-26 ENCOUNTER — Encounter: Payer: Self-pay | Admitting: Adult Health

## 2018-11-26 ENCOUNTER — Ambulatory Visit: Payer: Medicare Other | Admitting: Adult Health

## 2018-11-26 VITALS — BP 107/63 | HR 58 | Ht 65.0 in | Wt 210.0 lb

## 2018-11-26 DIAGNOSIS — F0390 Unspecified dementia without behavioral disturbance: Secondary | ICD-10-CM

## 2018-11-26 MED ORDER — DONEPEZIL HCL 10 MG PO TABS: ORAL_TABLET | ORAL | 3 refills | Status: DC

## 2018-11-26 MED ORDER — MEMANTINE HCL 10 MG PO TABS: 10.0000 mg | ORAL_TABLET | Freq: Two times a day (BID) | ORAL | 3 refills | Status: DC

## 2018-11-26 NOTE — Progress Notes (Signed)
Agree with history, physical, neuro exam,assessment and plan as stated above.     Sarina Ill, MD [facility]

## 2018-11-26 NOTE — Patient Instructions (Signed)
Your Plan:  Continue Aricept and Namenda Memory score has declined slightly  If your symptoms worsen or you develop new symptoms please let us know.    Thank you for coming to see [place] at [facility]. I hope we have been able to provide you high quality care today.  You may receive a patient satisfaction survey over the next few weeks. We would appreciate your feedback and comments so that we may continue to improve ourselves and the health of our patients.

## 2018-11-26 NOTE — Progress Notes (Signed)
PATIENT: Martha Mata DOB: December 08, 1940  REASON FOR VISIT: follow up HISTORY FROM: patient  HISTORY OF PRESENT ILLNESS: Today 11/26/18:  Martha Mata is a 78 year old female with a history of dementia.  She returns today for follow-up.  She remains on Aricept and Namenda.  She currently lives at home.  She does have a caregiver that is with her in the morning that she goes to a adult day center during the day.  She requires assistance with all ADLs.  She no longer prepares any meals but she is able to make sandwiches and heat up food. her family help manage her finances.  The caregiver and her son helps with her medications.  No significant change in her mood or behavior.  Denies any trouble sleeping.  No hallucinations.  She returns today for evaluation.  HISTORY /05/2018: Here for follow up on dementia. Stable on Namenda and Aricept. She is here with her son. Lives at home with daily aid. No drastic changes. No falls. Patient feels happy and she feels good. Appetite is good. Sleeping is good. Here with son and caretaker. Lives with son and has a caretaker. She goes to a Health visitor 3x a week.   05/27/2017: Martha Mata is a 78 year old female with a history of memory disturbance. She returns today for follow-up. She is here today with her son. She reports that she continues to live at home. She does have caregivers that checks on her frequently. She does stay at home alone at night. She does have a life alert necklace that she can use it needed. Her son denies any episodes of wandering. Patient is able to complete all ADLs independently. Her son reports that there is limited cooking. She does cook bacon and and soups. He denies any trouble using the stove. Her son manages all of her finances. She denies any trouble sleeping. Denies hallucinations. Denies any changes with her mood or behavior. She does usepublic transportationto go to a connections group. She returns today for an  evaluation.    11/26/2016: Martha Mata is a 78 year old female with a history of memory disturbance. She returns today for follow-up. She is here today with her caretaker and son. They report that her memory has remained stable. She continues to live at home alone. She does have caregivers that check on her throughout the day. She reports that she is able to complete most ADLs independently. She doesprepareher own meals without difficulty. Her son helps her with her finances. She does not operate a motor vehicle. Denies any trouble sleeping. Denies getting up at night wandering around the house. Denies hallucinations. Denies restlessness or agitation. Overall she feels that she is doing well. Family agrees with this. She is on Aricept and tolerating it well. She returns today for an evaluation.    TLX:BWIOM Martha Buchanonis a 78 y.o.femalehere as a referral from Dr. Anthoney Harada memory loss. Here with caretaker who provides information. She is here as a follow up. Memory changes started about 3 years ago. Slowly progressive, slowly getting worse. Started with short-term memory problems, repeating conversations. Caretaker says she loses her keys, she had to put a key rack in her room, she forgets things she is told, asks the same questions over and over again, forgets conversations, she cooks quick things doesn't cook big meals anymore. Caretaker is there a few hours a day. She lives alone in a condo. Caretaker does the driving, no accidents in the home, no falls. Son keep a  camera in the house to watch her and make sure she is safe, he lives in [place]. Discussed assisted living close to son, recommended that she look into it. No hallucinations, agitation, no issues at all. No depression, no behavioral problems, she is still very social, goes to the [employer] weekly. Son has taken over finances and caring for the house in the last few years. She takes OTC b12. TSH has been normal in the past.    Reviewed notes, labs and imaging from outside physicians, which showed:  CBC nml, CMP with creatinine is 0.86 normal labs were taken 03/06/2016, LDL 104. TSH in the past has been normal. Patient takes daily B12 supplementation so we'll not check B12.  MRI of the brain 09/07/2013 (personally reviewed imaging and agree with the following):  Mildly abnormal MRI brain (without) demonstrating: 1. Few scattered foci of non-specific gliosis in the subcortical and juxtacortical white matter.  2. Single left occipital punctate focus of SWI hypointensity, may represent a cerebral microhemorrhage. 3. Above findings may be related to underlying mild chronic small vessel ischemic disease.    REVIEW OF SYSTEMS: Out of a complete 14 system review of symptoms, the patient complains only of the following symptoms, and all other reviewed systems are negative.  ALLERGIES: No Known Allergies  HOME MEDICATIONS: Outpatient Medications Prior to Visit  Medication Sig Dispense Refill  . amLODipine (NORVASC) 5 MG tablet TAKE 1 TABLET BY MOUTH EVERY DAY 90 tablet 2  . benazepril (LOTENSIN) 5 MG tablet Take 1 tablet (5 mg total) by mouth daily. 90 tablet 1  . Cholecalciferol (VITAMIN D-3) 5000 units TABS Take by mouth.    . cyanocobalamin 500 MCG tablet Take 500 mcg by mouth daily.    Marland Kitchen donepezil (ARICEPT) 10 MG tablet TAKE 1 TABLET BY MOUTH EVERY DAY 90 tablet 3  . hydrochlorothiazide (HYDRODIURIL) 25 MG tablet Take 1 tablet (25 mg total) by mouth daily. 90 tablet 1  . memantine (NAMENDA) 10 MG tablet TAKE 1 TABLET (10 MG TOTAL) BY MOUTH 2 (TWO) TIMES DAILY. 180 tablet 3  . metoprolol tartrate (LOPRESSOR) 100 MG tablet Take 1 tablet (100 mg total) by mouth 2 (two) times daily. 180 tablet 1  . vitamin E 400 UNIT capsule Take 400 Units by mouth daily.     No facility-administered medications prior to visit.     PAST MEDICAL HISTORY: Past Medical History:  Diagnosis Date  . Fibroid, uterine   .  GERD (gastroesophageal reflux disease)    no meds  . Hypertension   . Memory loss of unknown cause    per pt's admission  . Vitamin D deficiency disease     PAST SURGICAL HISTORY: Past Surgical History:  Procedure Laterality Date  . CERVIX LESION DESTRUCTION    . COLONOSCOPY  2005  . DILATATION & CURRETTAGE/HYSTEROSCOPY WITH RESECTOCOPE N/A 08/14/2013   Procedure: DILATATION & CURETTAGE/HYSTEROSCOPY WITH RESECTOCOPE;  Surgeon: Marvene Staff, MD;  Location: [facility];  Service: Gynecology;  Laterality: N/A;  . DILATION AND CURETTAGE OF UTERUS    . HYSTEROSCOPY    . KNEE ARTHROCENTESIS  2010  . TUBAL LIGATION    . UTERINE FIBROID SURGERY  2005    FAMILY HISTORY: No family history on file.  SOCIAL HISTORY: Social History   Socioeconomic History  . Marital status: Widowed    Spouse name: Not on file  . Number of children: 2  . Years of education: 12+  . Highest education level: Not on file  Occupational History  . Occupation: Retired  Scientific laboratory technician  . Financial resource strain: Not on file  . Food insecurity:    Worry: Not on file    Inability: Not on file  . Transportation needs:    Medical: Not on file    Non-medical: Not on file  Tobacco Use  . Smoking status: Never Smoker  . Smokeless tobacco: Never Used  Substance and Sexual Activity  . Alcohol use: Yes    Comment: occasionally,one glass of wine monthly  . Drug use: No  . Sexual activity: Not on file  Lifestyle  . Physical activity:    Days per week: Not on file    Minutes per session: Not on file  . Stress: Not on file  Relationships  . Social connections:    Talks on phone: Not on file    Gets together: Not on file    Attends religious service: Not on file    Active member of club or organization: Not on file    Attends meetings of clubs or organizations: Not on file    Relationship status: Not on file  . Intimate partner violence:    Fear of current or ex partner: Not on file    Emotionally  abused: Not on file    Physically abused: Not on file    Forced sexual activity: Not on file  Other Topics Concern  . Not on file  Social History Narrative   Patient is retired.    Patient is a non-smoker    Patient lives at home alone. Has a caretaker who comes in to help as needed.   Patient consumes tea and coffee (1-2 cups daily)   Patient is right handed.   Patient has a college education.   Patient has two children.      PHYSICAL EXAM  Vitals:   11/26/18 0906  BP: 107/63  Pulse: (!) 58  Weight: 210 lb (95.3 kg)  Height: 5\' 5"  (1.651 m)   Body mass index is 34.95 kg/m.   MMSE - Mini Mental State Exam 11/26/2018 05/27/2017 11/26/2016  Not completed: (No Data) - -  Orientation to time 0 0 3  Orientation to Place 0 2 2  Registration 3 3 3   Attention/ Calculation 0 0 0  Recall 0 1 0  Language- name 2 objects 1 2 2   Language- repeat 1 0 1  Language- follow 3 step command 2 2 3   Language- read & follow direction 0 1 1  Write a sentence 0 0 1  Copy design 0 0 0  Total score 7 11 16      Generalized: Well developed, in no acute distress   Neurological examination  Mentation: Alert oriented to time, place, history taking. Follows all commands speech and language fluent Cranial nerve II-XII: Pupils were equal round reactive to light. Extraocular movements were full, visual field were full on confrontational test. Facial sensation and strength were normal. Uvula tongue midline. Head turning and shoulder shrug  were normal and symmetric. Motor: The motor testing reveals 5 over 5 strength of all 4 extremities. Good symmetric motor tone is noted throughout.  Sensory: Sensory testing is intact to soft touch on all 4 extremities. No evidence of extinction is noted.  Coordination: Cerebellar testing reveals good finger-nose-finger and heel-to-shin bilaterally.  Gait and station: Gait is normal. Reflexes: Deep tendon reflexes are symmetric and normal bilaterally.   DIAGNOSTIC  DATA (LABS, IMAGING, TESTING) - I reviewed patient records, labs, notes, testing and  imaging myself where available.  Lab Results  Component Value Date   WBC 5.7 04/25/2018   HGB 12.1 04/25/2018   HCT 36.1 04/25/2018   MCV 94.6 04/25/2018   PLT 307.0 04/25/2018      Component Value Date/Time   NA 143 04/25/2018 1408   K 3.5 04/25/2018 1408   CL 104 04/25/2018 1408   CO2 29 04/25/2018 1408   GLUCOSE 108 (H) 04/25/2018 1408   BUN 15 04/25/2018 1408   CREATININE 1.07 04/25/2018 1408   CALCIUM 9.6 04/25/2018 1408   PROT 7.6 04/25/2018 1408   ALBUMIN 3.8 04/25/2018 1408   AST 15 04/25/2018 1408   ALT 12 04/25/2018 1408   ALKPHOS 67 04/25/2018 1408   BILITOT 0.3 04/25/2018 1408   GFRNONAA 48 (L) 08/22/2017 0850   GFRAA 55 (L) 08/22/2017 0850   Lab Results  Component Value Date   CHOL 177 04/25/2018   HDL 42.80 04/25/2018   LDLCALC 107 (H) 04/25/2018   TRIG 136.0 04/25/2018   CHOLHDL 4 04/25/2018   No results found for: HGBA1C Lab Results  Component Value Date   VITAMINB12 >1999 (H) 08/11/2013   Lab Results  Component Value Date   TSH 1.15 04/25/2018      ASSESSMENT AND PLAN 78 y.o. year old female  has a past medical history of Fibroid, uterine, GERD (gastroesophageal reflux disease), Hypertension, Memory loss of unknown cause, and Vitamin D deficiency disease. here with:  1.  Memory disturbance  Memory score has declined slightly over the last year..  The patient will continue on Namenda and Aricept.  I have advised that if her symptoms worsen or she develops new symptoms she should let us know.  She will follow-up in 6 months or sooner if needed.   I spent 15 minutes with the patient. 50% of this time was spent reviewing the patient's plan of care   Ward Givens, MSN, NP-C 11/26/2018, 8:49 AM [facility] [address], [place], [place] [address] [phone]

## 2019-03-23 ENCOUNTER — Other Ambulatory Visit: Payer: Self-pay | Admitting: *Deleted

## 2019-03-23 MED ORDER — BENAZEPRIL HCL 5 MG PO TABS: 5.0000 mg | ORAL_TABLET | Freq: Every day | ORAL | 0 refills | Status: DC

## 2019-03-23 MED ORDER — HYDROCHLOROTHIAZIDE 25 MG PO TABS: 25.0000 mg | ORAL_TABLET | Freq: Every day | ORAL | 0 refills | Status: DC

## 2019-03-30 ENCOUNTER — Other Ambulatory Visit: Payer: Self-pay | Admitting: *Deleted

## 2019-03-30 MED ORDER — METOPROLOL TARTRATE 100 MG PO TABS: 100.0000 mg | ORAL_TABLET | Freq: Two times a day (BID) | ORAL | 0 refills | Status: DC

## 2019-04-22 ENCOUNTER — Telehealth: Payer: Self-pay

## 2019-04-22 NOTE — Telephone Encounter (Signed)
Received appt request about the patient having some left leg pain and limping due to a fall she had in the shower. I left a voicemail advising him that he should reach out to his mother's pcp for an appt since this is a new problem.  I called the cargiver [name] as well and she verbalized understanding the instructions about scheduling an appt with the pcp. She stated that she would relay this message to the patient's son. She was very appreciative for the call. No other questions or concerns at this time.

## 2019-04-23 ENCOUNTER — Telehealth: Payer: Self-pay | Admitting: Internal Medicine

## 2019-04-23 NOTE — Telephone Encounter (Signed)
Patient's son called requesting for patient to transfer care to you. She was previously a patient of [facility] and is looking for a new PCP. He states that the patient's caregiver is a current patient of yours as well and would prefer to see you if possible.

## 2019-04-23 NOTE — Telephone Encounter (Signed)
Yes, I will see her  Martha Mata

## 2019-04-23 NOTE — Telephone Encounter (Signed)
Appointment scheduled.

## 2019-04-24 ENCOUNTER — Ambulatory Visit: Payer: Medicare Other | Admitting: Internal Medicine

## 2019-04-24 ENCOUNTER — Encounter: Payer: Self-pay | Admitting: Internal Medicine

## 2019-04-27 ENCOUNTER — Other Ambulatory Visit: Payer: Medicare Other

## 2019-04-27 ENCOUNTER — Ambulatory Visit (INDEPENDENT_AMBULATORY_CARE_PROVIDER_SITE_OTHER): Payer: Medicare Other | Admitting: Internal Medicine

## 2019-04-27 ENCOUNTER — Ambulatory Visit (INDEPENDENT_AMBULATORY_CARE_PROVIDER_SITE_OTHER)
Admission: RE | Admit: 2019-04-27 | Discharge: 2019-04-27 | Disposition: A | Discharge status: Home / Self Care | Payer: Medicare Other | Source: Ambulatory Visit | Attending: Internal Medicine | Admitting: Internal Medicine

## 2019-04-27 ENCOUNTER — Encounter: Payer: Self-pay | Admitting: Internal Medicine

## 2019-04-27 ENCOUNTER — Other Ambulatory Visit: Payer: Self-pay

## 2019-04-27 VITALS — BP 110/62 | HR 68 | Temp 98.1°F | Resp 16 | Ht 65.0 in | Wt 209.0 lb

## 2019-04-27 DIAGNOSIS — Z0001 Encounter for general adult medical examination with abnormal findings: Secondary | ICD-10-CM | POA: Diagnosis not present

## 2019-04-27 DIAGNOSIS — S8992XA Unspecified injury of left lower leg, initial encounter: Secondary | ICD-10-CM

## 2019-04-27 DIAGNOSIS — Z23 Encounter for immunization: Secondary | ICD-10-CM | POA: Diagnosis not present

## 2019-04-27 DIAGNOSIS — E876 Hypokalemia: Secondary | ICD-10-CM

## 2019-04-27 DIAGNOSIS — M1732 Unilateral post-traumatic osteoarthritis, left knee: Secondary | ICD-10-CM

## 2019-04-27 DIAGNOSIS — R739 Hyperglycemia, unspecified: Secondary | ICD-10-CM | POA: Insufficient documentation

## 2019-04-27 DIAGNOSIS — I10 Essential (primary) hypertension: Secondary | ICD-10-CM

## 2019-04-27 DIAGNOSIS — E785 Hyperlipidemia, unspecified: Secondary | ICD-10-CM | POA: Diagnosis not present

## 2019-04-27 DIAGNOSIS — Z0000 Encounter for general adult medical examination without abnormal findings: Secondary | ICD-10-CM | POA: Insufficient documentation

## 2019-04-27 DIAGNOSIS — T502X5A Adverse effect of carbonic-anhydrase inhibitors, benzothiadiazides and other diuretics, initial encounter: Secondary | ICD-10-CM

## 2019-04-27 MED ORDER — MELOXICAM 7.5 MG PO TABS: 7.5000 mg | ORAL_TABLET | Freq: Every day | ORAL | 0 refills | Status: DC

## 2019-04-27 NOTE — Progress Notes (Addendum)
Subjective:  Patient ID: Martha Mata, female    DOB: 1940-12-26  Age: 78 y.o. MRN: [id number]  CC: Hypertension, Hyperlipidemia, and Annual Exam  NEW TO ME  HPI Martha Mata presents for f/up - She is a poor historian.  She is with a caretaker today who reports that she somehow injured her left knee a week ago when she was getting out of a bathtub.  She initially had pain and swelling but the swelling has resolved.  She complains of pain and has been limping but she has not taken anything for pain.  Outpatient Medications Prior to Visit  Medication Sig Dispense Refill  . amLODipine (NORVASC) 5 MG tablet TAKE 1 TABLET BY MOUTH EVERY DAY 90 tablet 2  . Cholecalciferol (VITAMIN D-3) 5000 units TABS Take by mouth.    . cyanocobalamin 500 MCG tablet Take 500 mcg by mouth daily.    Marland Kitchen donepezil (ARICEPT) 10 MG tablet TAKE 1 TABLET BY MOUTH EVERY DAY 90 tablet 3  . memantine (NAMENDA) 10 MG tablet Take 1 tablet (10 mg total) by mouth 2 (two) times daily. 180 tablet 3  . metoprolol tartrate (LOPRESSOR) 100 MG tablet Take 1 tablet (100 mg total) by mouth 2 (two) times daily. Need to establish with new provider for future refills. 180 tablet 0  . benazepril (LOTENSIN) 5 MG tablet Take 1 tablet (5 mg total) by mouth daily. Need to establish with new provider for future refills. 90 tablet 0  . hydrochlorothiazide (HYDRODIURIL) 25 MG tablet Take 1 tablet (25 mg total) by mouth daily. Need to establish with new provider for future refills. 90 tablet 0  . vitamin E 400 UNIT capsule Take 400 Units by mouth daily.     No facility-administered medications prior to visit.     ROS Review of Systems  Constitutional: Negative.  Negative for diaphoresis, fatigue and unexpected weight change.  HENT: Negative.   Eyes: Negative for visual disturbance.  Respiratory: Negative for cough, chest tightness, shortness of breath and wheezing.   Cardiovascular: Negative for chest pain, palpitations and leg  swelling.  Gastrointestinal: Negative for abdominal pain, constipation, diarrhea, nausea and vomiting.  Endocrine: Negative.   Genitourinary: Negative.  Negative for difficulty urinating.  Musculoskeletal: Positive for arthralgias. Negative for gait problem.  Skin: Negative.  Negative for color change and pallor.  Neurological: Negative.  Negative for dizziness, weakness and light-headedness.  Hematological: Negative for adenopathy. Does not bruise/bleed easily.  Psychiatric/Behavioral: Positive for confusion and decreased concentration. Negative for agitation, dysphoric mood, self-injury and suicidal ideas. The patient is not nervous/anxious.     Objective:  BP 110/62 (BP Location: Left Arm, Patient Position: Sitting, Cuff Size: Large)   Pulse 68   Temp 98.1 F (36.7 C) (Oral)   Resp 16   Ht 5\' 5"  (1.651 m)   Wt 209 lb (94.8 kg)   SpO2 98%   BMI 34.78 kg/m   BP Readings from Last 3 Encounters:  04/27/19 110/62  11/26/18 107/63  11/06/18 120/62    Wt Readings from Last 3 Encounters:  04/27/19 209 lb (94.8 kg)  11/26/18 210 lb (95.3 kg)  11/06/18 209 lb (94.8 kg)    Physical Exam Constitutional:      Appearance: She is not ill-appearing.  HENT:     Nose: Nose normal.     Mouth/Throat:     Mouth: Mucous membranes are moist.     Pharynx: No oropharyngeal exudate.  Eyes:     General: No scleral  icterus.    Conjunctiva/sclera: Conjunctivae normal.  Neck:     Musculoskeletal: Normal range of motion. No neck rigidity.  Cardiovascular:     Rate and Rhythm: Normal rate and regular rhythm.     Heart sounds: No murmur.  Pulmonary:     Effort: Pulmonary effort is normal.     Breath sounds: No stridor. No wheezing, rhonchi or rales.  Abdominal:     General: Abdomen is flat.     Palpations: There is no hepatomegaly or splenomegaly.     Tenderness: There is no abdominal tenderness.  Genitourinary:    Comments: Breast, GU, rectal exams were deferred at the request of the  patient and her caretaker. Musculoskeletal:     Left knee: She exhibits deformity (DJD changes) and bony tenderness (lateral knee). She exhibits normal range of motion, no swelling, no effusion, no ecchymosis, no LCL laxity, normal meniscus and no MCL laxity. No tenderness found.     Right lower leg: No edema.     Left lower leg: No edema.  Lymphadenopathy:     Cervical: No cervical adenopathy.  Skin:    General: Skin is warm and dry.  Neurological:     General: No focal deficit present.     Mental Status: She is alert. Mental status is at baseline.     Cranial Nerves: Cranial nerves are intact.     Motor: Motor function is intact.     Coordination: Coordination is intact.  Psychiatric:        Attention and Perception: Perception normal. She is inattentive.        Mood and Affect: Mood normal.        Speech: Speech is delayed.        Behavior: Behavior is slowed and withdrawn. Behavior is not aggressive.        Thought Content: Thought content normal. Thought content is not paranoid or delusional. Thought content does not include homicidal or suicidal ideation.        Cognition and Memory: Cognition is impaired. Memory is impaired. She exhibits impaired recent memory and impaired remote memory.        Judgment: Judgment normal.     Lab Results  Component Value Date   WBC 5.4 04/29/2019   HGB 11.9 (L) 04/29/2019   HCT 36.2 04/29/2019   PLT 289.0 04/29/2019   GLUCOSE 180 (H) 04/29/2019   CHOL 187 04/29/2019   TRIG 95.0 04/29/2019   HDL 44.00 04/29/2019   LDLCALC 124 (H) 04/29/2019   ALT 11 04/29/2019   AST 12 04/29/2019   NA 139 04/29/2019   K 3.3 (L) 04/29/2019   CL 102 04/29/2019   CREATININE 1.24 (H) 04/29/2019   BUN 17 04/29/2019   CO2 27 04/29/2019   TSH 2.45 04/29/2019   INR 1.72 (H) 05/11/2010   HGBA1C 7.1 (H) 04/29/2019    Mm Diag Breast Tomo Bilateral  Result Date: 08/28/2018 CLINICAL DATA:  78 year old patient presents for evaluation of right axillary  mass. A family member is with her today. EXAM: DIGITAL DIAGNOSTIC BILATERAL MAMMOGRAM WITH CAD AND TOMO ULTRASOUND RIGHT AXILLA COMPARISON:  Previous exam(s). ACR Breast Density Category b: There are scattered areas of fibroglandular density. FINDINGS: No mass, architectural distortion, or suspicious microcalcification is identified to suggest malignancy in either breast. Spot tangential view of the right axilla in the region of concern shows outwardly bulging fatty tissue without evidence of a mass or enlarged lymph node. Mammographic images were processed with CAD. On physical exam,  there is soft fatty tissue that bulges out early in the right axilla. No mass is palpated. Targeted ultrasound is performed, showing normal subcutaneous fat. No lymphadenopathy or mass lesion. IMPRESSION: No evidence of malignancy in either breast. Soft prominent right axillary fat in the region of concern. Negative for lymphadenopathy or axillary mass. RECOMMENDATION: Screening mammogram in one year.(Code:SM-B-01Y) I have discussed the findings and recommendations with the patient. Results were also provided in writing at the conclusion of the visit. If applicable, a reminder letter will be sent to the patient regarding the next appointment. BI-RADS CATEGORY  1: Negative. Electronically Signed   By: Curlene Dolphin M.D.   On: 08/28/2018 12:41   [place] Axilla Right  Result Date: 08/28/2018 CLINICAL DATA:  78 year old patient presents for evaluation of right axillary mass. A family member is with her today. EXAM: DIGITAL DIAGNOSTIC BILATERAL MAMMOGRAM WITH CAD AND TOMO ULTRASOUND RIGHT AXILLA COMPARISON:  Previous exam(s). ACR Breast Density Category b: There are scattered areas of fibroglandular density. FINDINGS: No mass, architectural distortion, or suspicious microcalcification is identified to suggest malignancy in either breast. Spot tangential view of the right axilla in the region of concern shows outwardly bulging fatty tissue  without evidence of a mass or enlarged lymph node. Mammographic images were processed with CAD. On physical exam, there is soft fatty tissue that bulges out early in the right axilla. No mass is palpated. Targeted ultrasound is performed, showing normal subcutaneous fat. No lymphadenopathy or mass lesion. IMPRESSION: No evidence of malignancy in either breast. Soft prominent right axillary fat in the region of concern. Negative for lymphadenopathy or axillary mass. RECOMMENDATION: Screening mammogram in one year.(Code:SM-B-01Y) I have discussed the findings and recommendations with the patient. Results were also provided in writing at the conclusion of the visit. If applicable, a reminder letter will be sent to the patient regarding the next appointment. BI-RADS CATEGORY  1: Negative. Electronically Signed   By: Curlene Dolphin M.D.   On: 08/28/2018 12:41   No results found.   Assessment & Plan:   Kameria was seen today for hypertension, hyperlipidemia and annual exam.  Diagnoses and all orders for this visit:  Hypertension, unspecified type- Her blood pressure is over controlled.  I have asked her to stop taking the ACE inhibitor and thiazide diuretic.  Will continue the beta-blocker and calcium channel blocker. -     Comprehensive metabolic panel; Future -     CBC with Differential/Platelet; Future -     TSH; Future  Routine general medical examination at a health care facility  Hyperlipidemia LDL goal <130- I will check a fasting lipid panel and will recommend a statin if indicated. -     Lipid panel; Future -     TSH; Future  Hyperglycemia - I will monitor her A1c to see if she has developed type 2 diabetes mellitus. -     Hemoglobin A1c; Future  Need for pneumococcal vaccination -     Pneumococcal polysaccharide vaccine 23-valent greater than or equal to 2yo subcutaneous/IM  Left knee injury, initial encounter- Plain films are negative for fracture but positive for osteoarthritis. -      DG Knee Complete 4 Views Left; Future  Post-traumatic osteoarthritis of left knee -     meloxicam (MOBIC) 7.5 MG tablet; Take 1 tablet (7.5 mg total) by mouth daily. -     DG Knee Complete 4 Views Left; Future   I have discontinued Martha Mata's vitamin E, benazepril, and hydrochlorothiazide. I am  also having her start on meloxicam and rosuvastatin. Additionally, I am having her maintain her Vitamin D-3, vitamin B-12, amLODipine, memantine, donepezil, and metoprolol tartrate.  Meds ordered this encounter  Medications  . meloxicam (MOBIC) 7.5 MG tablet    Sig: Take 1 tablet (7.5 mg total) by mouth daily.    Dispense:  30 tablet    Refill:  0  . rosuvastatin (CRESTOR) 20 MG tablet    Sig: Take 1 tablet (20 mg total) by mouth daily.    Dispense:  90 tablet    Refill:  1     Follow-up: Return in about 4 months (around 08/27/2019).  Scarlette Calico, MD

## 2019-04-27 NOTE — Patient Instructions (Signed)

## 2019-04-28 ENCOUNTER — Encounter: Payer: Self-pay | Admitting: Internal Medicine

## 2019-04-29 ENCOUNTER — Other Ambulatory Visit (INDEPENDENT_AMBULATORY_CARE_PROVIDER_SITE_OTHER): Payer: Medicare Other

## 2019-04-29 DIAGNOSIS — E785 Hyperlipidemia, unspecified: Secondary | ICD-10-CM | POA: Diagnosis not present

## 2019-04-29 DIAGNOSIS — I10 Essential (primary) hypertension: Secondary | ICD-10-CM

## 2019-04-29 DIAGNOSIS — R739 Hyperglycemia, unspecified: Secondary | ICD-10-CM

## 2019-04-29 LAB — COMPREHENSIVE METABOLIC PANEL
ALT: 11 U/L (ref 0–35)
AST: 12 U/L (ref 0–37)
Albumin: 3.6 g/dL (ref 3.5–5.2)
Alkaline Phosphatase: 65 U/L (ref 39–117)
BUN: 17 mg/dL (ref 6–23)
CO2: 27 mEq/L (ref 19–32)
Calcium: 9.1 mg/dL (ref 8.4–10.5)
Chloride: 102 mEq/L (ref 96–112)
Creatinine, Ser: 1.24 mg/dL — ABNORMAL HIGH (ref 0.40–1.20)
GFR: 50.54 mL/min — ABNORMAL LOW (ref >60.00)
Glucose, Bld: 180 mg/dL — ABNORMAL HIGH (ref 70–99)
Potassium: 3.3 mEq/L — ABNORMAL LOW (ref 3.5–5.1)
Sodium: 139 mEq/L (ref 135–145)
Total Bilirubin: 0.4 mg/dL (ref 0.2–1.2)
Total Protein: 7.1 g/dL (ref 6.0–8.3)

## 2019-04-29 LAB — CBC WITH DIFFERENTIAL/PLATELET
Basophils Absolute: 0 10*3/uL (ref 0.0–0.1)
Basophils Relative: 0.4 % (ref 0.0–3.0)
Eosinophils Absolute: 0.1 10*3/uL (ref 0.0–0.7)
Eosinophils Relative: 1.7 % (ref 0.0–5.0)
HCT: 36.2 % (ref 36.0–46.0)
Hemoglobin: 11.9 g/dL — ABNORMAL LOW (ref 12.0–15.0)
Lymphocytes Relative: 34 % (ref 12.0–46.0)
Lymphs Abs: 1.8 10*3/uL (ref 0.7–4.0)
MCHC: 32.7 g/dL (ref 30.0–36.0)
MCV: 94.6 fl (ref 78.0–100.0)
Monocytes Absolute: 0.5 10*3/uL (ref 0.1–1.0)
Monocytes Relative: 8.8 % (ref 3.0–12.0)
Neutro Abs: 3 10*3/uL (ref 1.4–7.7)
Neutrophils Relative %: 55.1 % (ref 43.0–77.0)
Platelets: 289 10*3/uL (ref 150.0–400.0)
RBC: 3.83 Mil/uL — ABNORMAL LOW (ref 3.87–5.11)
RDW: 13.3 % (ref 11.5–15.5)
WBC: 5.4 10*3/uL (ref 4.0–10.5)

## 2019-04-29 LAB — HEMOGLOBIN A1C: Hgb A1c MFr Bld: 7.1 % — ABNORMAL HIGH (ref 4.6–6.5)

## 2019-04-29 LAB — LIPID PANEL
Cholesterol: 187 mg/dL (ref 0–200)
HDL: 44 mg/dL (ref >39.00)
LDL Cholesterol: 124 mg/dL — ABNORMAL HIGH (ref 0–99)
NonHDL: 142.93
Total CHOL/HDL Ratio: 4
Triglycerides: 95 mg/dL (ref 0.0–149.0)
VLDL: 19 mg/dL (ref 0.0–40.0)

## 2019-04-29 LAB — TSH: TSH: 2.45 u[IU]/mL (ref 0.35–4.50)

## 2019-05-04 ENCOUNTER — Encounter: Payer: Self-pay | Admitting: Internal Medicine

## 2019-05-04 MED ORDER — ROSUVASTATIN CALCIUM 20 MG PO TABS: 20.0000 mg | ORAL_TABLET | Freq: Every day | ORAL | 1 refills | Status: DC

## 2019-05-04 MED ORDER — POTASSIUM CHLORIDE CRYS ER 20 MEQ PO TBCR: 20.0000 meq | EXTENDED_RELEASE_TABLET | Freq: Every day | ORAL | 0 refills | Status: DC

## 2019-05-04 NOTE — Addendum Note (Signed)
Addended by: Janith Lima on: 05/04/2019 03:56 PM   Modules accepted: Orders

## 2019-05-22 ENCOUNTER — Other Ambulatory Visit: Payer: Self-pay | Admitting: Internal Medicine

## 2019-05-22 DIAGNOSIS — M1732 Unilateral post-traumatic osteoarthritis, left knee: Secondary | ICD-10-CM

## 2019-05-25 ENCOUNTER — Encounter: Payer: Self-pay | Admitting: Internal Medicine

## 2019-06-20 ENCOUNTER — Other Ambulatory Visit: Payer: Self-pay | Admitting: Internal Medicine

## 2019-06-23 ENCOUNTER — Other Ambulatory Visit: Payer: Self-pay | Admitting: Internal Medicine

## 2019-06-23 DIAGNOSIS — I10 Essential (primary) hypertension: Secondary | ICD-10-CM

## 2019-06-23 MED ORDER — AMLODIPINE BESYLATE 5 MG PO TABS: 5.0000 mg | ORAL_TABLET | Freq: Every day | ORAL | 1 refills | Status: DC

## 2019-06-29 ENCOUNTER — Other Ambulatory Visit: Payer: Self-pay | Admitting: Internal Medicine

## 2019-07-18 ENCOUNTER — Other Ambulatory Visit: Payer: Self-pay | Admitting: Adult Health

## 2019-07-19 ENCOUNTER — Other Ambulatory Visit: Payer: Self-pay | Admitting: Internal Medicine

## 2019-08-14 ENCOUNTER — Other Ambulatory Visit: Payer: Self-pay | Admitting: Adult Health

## 2019-08-26 ENCOUNTER — Other Ambulatory Visit (INDEPENDENT_AMBULATORY_CARE_PROVIDER_SITE_OTHER): Payer: Medicare Other

## 2019-08-26 ENCOUNTER — Other Ambulatory Visit: Payer: Self-pay

## 2019-08-26 ENCOUNTER — Ambulatory Visit (INDEPENDENT_AMBULATORY_CARE_PROVIDER_SITE_OTHER): Payer: Medicare Other | Admitting: Internal Medicine

## 2019-08-26 ENCOUNTER — Encounter: Payer: Self-pay | Admitting: Internal Medicine

## 2019-08-26 VITALS — BP 130/70 | HR 68 | Temp 98.5°F | Ht 65.0 in | Wt 215.0 lb

## 2019-08-26 DIAGNOSIS — E118 Type 2 diabetes mellitus with unspecified complications: Secondary | ICD-10-CM | POA: Insufficient documentation

## 2019-08-26 DIAGNOSIS — I10 Essential (primary) hypertension: Secondary | ICD-10-CM

## 2019-08-26 DIAGNOSIS — Z23 Encounter for immunization: Secondary | ICD-10-CM | POA: Diagnosis not present

## 2019-08-26 DIAGNOSIS — D539 Nutritional anemia, unspecified: Secondary | ICD-10-CM | POA: Insufficient documentation

## 2019-08-26 DIAGNOSIS — E876 Hypokalemia: Secondary | ICD-10-CM

## 2019-08-26 LAB — CBC WITH DIFFERENTIAL/PLATELET
Basophils Absolute: 0 10*3/uL (ref 0.0–0.1)
Basophils Relative: 0.5 % (ref 0.0–3.0)
Eosinophils Absolute: 0.1 10*3/uL (ref 0.0–0.7)
Eosinophils Relative: 1.3 % (ref 0.0–5.0)
HCT: 37.5 % (ref 36.0–46.0)
Hemoglobin: 12.4 g/dL (ref 12.0–15.0)
Lymphocytes Relative: 25 % (ref 12.0–46.0)
Lymphs Abs: 1.5 10*3/uL (ref 0.7–4.0)
MCHC: 33.1 g/dL (ref 30.0–36.0)
MCV: 93.6 fl (ref 78.0–100.0)
Monocytes Absolute: 0.6 10*3/uL (ref 0.1–1.0)
Monocytes Relative: 9.9 % (ref 3.0–12.0)
Neutro Abs: 3.8 10*3/uL (ref 1.4–7.7)
Neutrophils Relative %: 63.3 % (ref 43.0–77.0)
Platelets: 265 10*3/uL (ref 150.0–400.0)
RBC: 4 Mil/uL (ref 3.87–5.11)
RDW: 13.9 % (ref 11.5–15.5)
WBC: 5.9 10*3/uL (ref 4.0–10.5)

## 2019-08-26 LAB — VITAMIN D 25 HYDROXY (VIT D DEFICIENCY, FRACTURES): VITD: 33.45 ng/mL (ref 30.00–100.00)

## 2019-08-26 LAB — FOLATE: Folate: 17.5 ng/mL (ref >5.9)

## 2019-08-26 LAB — BASIC METABOLIC PANEL
BUN: 14 mg/dL (ref 6–23)
CO2: 27 mEq/L (ref 19–32)
Calcium: 9.7 mg/dL (ref 8.4–10.5)
Chloride: 105 mEq/L (ref 96–112)
Creatinine, Ser: 1.08 mg/dL (ref 0.40–1.20)
GFR: 59.23 mL/min — ABNORMAL LOW (ref >60.00)
Glucose, Bld: 147 mg/dL — ABNORMAL HIGH (ref 70–99)
Potassium: 4.2 mEq/L (ref 3.5–5.1)
Sodium: 141 mEq/L (ref 135–145)

## 2019-08-26 LAB — HEMOGLOBIN A1C: Hgb A1c MFr Bld: 7.2 % — ABNORMAL HIGH (ref 4.6–6.5)

## 2019-08-26 LAB — FERRITIN: Ferritin: 59 ng/mL (ref 10.0–291.0)

## 2019-08-26 LAB — MAGNESIUM: Magnesium: 2.2 mg/dL (ref 1.5–2.5)

## 2019-08-26 NOTE — Patient Instructions (Signed)
Type 2 Diabetes Mellitus, Diagnosis, Adult Type 2 diabetes (type 2 diabetes mellitus) is a long-term (chronic) disease. In type 2 diabetes, one or both of these problems may be present:  The pancreas does not make enough of a hormone called insulin.  Cells in the body do not respond properly to insulin that the body makes (insulin resistance). Normally, insulin allows blood sugar (glucose) to enter cells in the body. The cells use glucose for energy. Insulin resistance or lack of insulin causes excess glucose to build up in the blood instead of going into cells. As a result, high blood glucose (hyperglycemia) develops. What increases the risk? The following factors may make you more likely to develop type 2 diabetes:  Having a family member with type 2 diabetes.  Being overweight or obese.  Having an inactive (sedentary) lifestyle.  Having been diagnosed with insulin resistance.  Having a history of prediabetes, gestational diabetes, or polycystic ovary syndrome (PCOS).  Being of American-Indian, African-American, Hispanic/Latino, or Asian/Pacific Islander descent. What are the signs or symptoms? In the early stage of this condition, you may not have symptoms. Symptoms develop slowly and may include:  Increased thirst (polydipsia).  Increased hunger(polyphagia).  Increased urination (polyuria).  Increased urination during the night (nocturia).  Unexplained weight loss.  Frequent infections that keep coming back (recurring).  Fatigue.  Weakness.  Vision changes, such as blurry vision.  Cuts or bruises that are slow to heal.  Tingling or numbness in the hands or feet.  Dark patches on the skin (acanthosis nigricans). How is this diagnosed? This condition is diagnosed based on your symptoms, your medical history, a physical exam, and your blood glucose level. Your blood glucose may be checked with one or more of the following blood tests:  A fasting blood glucose (FBG)  test. You will not be allowed to eat (you will fast) for 8 hours or longer before a blood sample is taken.  A random blood glucose test. This test checks blood glucose at any time of day regardless of when you ate.  An A1c (hemoglobin A1c) blood test. This test provides information about blood glucose control over the previous 2-3 months.  An oral glucose tolerance test (OGTT). This test measures your blood glucose at two times: ? After fasting. This is your baseline blood glucose level. ? Two hours after drinking a beverage that contains glucose. You may be diagnosed with type 2 diabetes if:  Your FBG level is 126 mg/dL (7.0 mmol/L) or higher.  Your random blood glucose level is 200 mg/dL (11.1 mmol/L) or higher.  Your A1c level is 6.5% or higher.  Your OGTT result is higher than 200 mg/dL (11.1 mmol/L). These blood tests may be repeated to confirm your diagnosis. How is this treated? Your treatment may be managed by a specialist called an endocrinologist. Type 2 diabetes may be treated by following instructions from your health care provider about:  Making diet and lifestyle changes. This may include: ? Following an individualized nutrition plan that is developed by a diet and nutrition specialist (registered dietitian). ? Exercising regularly. ? Finding ways to manage stress.  Checking your blood glucose level as often as told.  Taking diabetes medicines or insulin daily. This helps to keep your blood glucose levels in the healthy range. ? If you use insulin, you may need to adjust the dosage depending on how physically active you are and what foods you eat. Your health care provider will tell you how to adjust your dosage.    Taking medicines to help prevent complications from diabetes, such as: ? Aspirin. ? Medicine to lower cholesterol. ? Medicine to control blood pressure. Your health care provider will set individualized treatment goals for you. Your goals will be based on  your age, other medical conditions you have, and how you respond to diabetes treatment. Generally, the goal of treatment is to maintain the following blood glucose levels:  Before meals (preprandial): 80-130 mg/dL (4.4-7.2 mmol/L).  After meals (postprandial): below 180 mg/dL (10 mmol/L).  A1c level: less than 7%. Follow these instructions at home: Questions to ask your health care provider  Consider asking the following questions: ? Do I need to meet with a diabetes educator? ? Where can I find a support group for people with diabetes? ? What equipment will I need to manage my diabetes at home? ? What diabetes medicines do I need, and when should I take them? ? How often do I need to check my blood glucose? ? What number can I call if I have questions? ? When is my next appointment? General instructions  Take over-the-counter and prescription medicines only as told by your health care provider.  Keep all follow-up visits as told by your health care provider. This is important.  For more information about diabetes, visit: ? American Diabetes Association (ADA): www.diabetes.org ? American Association of Diabetes Educators (AADE): www.diabeteseducator.org Contact a health care provider if:  Your blood glucose is at or above 240 mg/dL (13.3 mmol/L) for 2 days in a row.  You have been sick or have had a fever for 2 days or longer, and you are not getting better.  You have any of the following problems for more than 6 hours: ? You cannot eat or drink. ? You have nausea and vomiting. ? You have diarrhea. Get help right away if:  Your blood glucose is lower than 54 mg/dL (3.0 mmol/L).  You become confused or you have trouble thinking clearly.  You have difficulty breathing.  You have moderate or large ketone levels in your urine. Summary  Type 2 diabetes (type 2 diabetes mellitus) is a long-term (chronic) disease. In type 2 diabetes, the pancreas does not make enough of a  hormone called insulin, or cells in the body do not respond properly to insulin that the body makes (insulin resistance).  This condition is treated by making diet and lifestyle changes and taking diabetes medicines or insulin.  Your health care provider will set individualized treatment goals for you. Your goals will be based on your age, other medical conditions you have, and how you respond to diabetes treatment.  Keep all follow-up visits as told by your health care provider. This is important. This information is not intended to replace advice given to you by your health care provider. Make sure you discuss any questions you have with your health care provider. Document Released: 11/05/2005 Document Revised: 01/03/2018 Document Reviewed: 12/09/2015 Elsevier Patient Education  2020 [employer]  

## 2019-08-26 NOTE — Progress Notes (Signed)
Subjective:  Patient ID: Martha Mata, female    DOB: [birth year]  Age: 78 y.o. MRN: [id number]  CC: Anemia and Diabetes   HPI Riella Lillis Bonfiglio presents for f/up -she is with her caretaker today and they think she is doing well.  She has had a good appetite and there is been no recent episodes of cough, shortness of breath, chest pain, dizziness, or lightheadedness.  Outpatient Medications Prior to Visit  Medication Sig Dispense Refill  . amLODipine (NORVASC) 5 MG tablet Take 1 tablet (5 mg total) by mouth daily. 90 tablet 1  . cyanocobalamin 500 MCG tablet Take 500 mcg by mouth daily.    Marland Kitchen donepezil (ARICEPT) 10 MG tablet TAKE 1 TABLET BY MOUTH EVERY DAY 90 tablet 1  . memantine (NAMENDA) 10 MG tablet Take 1 tablet (10 mg total) by mouth 2 (two) times daily. 180 tablet 3  . metoprolol tartrate (LOPRESSOR) 100 MG tablet Take 1 tablet (100 mg total) by mouth 2 (two) times daily. Need to establish with new provider for future refills. 180 tablet 0  . potassium chloride SA (K-DUR) 20 MEQ tablet Take 1 tablet (20 mEq total) by mouth daily. 180 tablet 0  . rosuvastatin (CRESTOR) 20 MG tablet Take 1 tablet (20 mg total) by mouth daily. 90 tablet 1  . Cholecalciferol (VITAMIN D-3) 5000 units TABS Take by mouth.    . benazepril (LOTENSIN) 5 MG tablet TAKE 1 TABLET (5 MG TOTAL) BY MOUTH DAILY. NEED TO ESTABLISH WITH NEW PROVIDER FOR FUTURE REFILLS. 90 tablet 3  . hydrochlorothiazide (HYDRODIURIL) 25 MG tablet TAKE 1 TABLET (25 MG TOTAL) BY MOUTH DAILY. NEED TO ESTABLISH WITH NEW PROVIDER FOR FUTURE REFILLS. 90 tablet 3  . meloxicam (MOBIC) 7.5 MG tablet TAKE 1 TABLET BY MOUTH EVERY DAY 90 tablet 0   No facility-administered medications prior to visit.     ROS Review of Systems  Constitutional: Positive for unexpected weight change (wt gain). Negative for diaphoresis and fatigue.  HENT: Negative.   Eyes: Negative.   Respiratory: Negative for cough, chest tightness, shortness of breath and  wheezing.   Cardiovascular: Negative for chest pain, palpitations and leg swelling.  Gastrointestinal: Negative for abdominal pain, constipation, diarrhea, nausea and vomiting.  Endocrine: Negative.   Genitourinary: Negative.  Negative for difficulty urinating, dysuria and hematuria.  Musculoskeletal: Negative.   Skin: Negative.   Neurological: Negative.   Hematological: Negative for adenopathy. Does not bruise/bleed easily.  Psychiatric/Behavioral: Negative.     Objective:  BP 130/70 (BP Location: Left Arm, Patient Position: Sitting, Cuff Size: Large)   Pulse 68   Temp 98.5 F (36.9 C) (Oral)   Ht 5\' 5"  (1.651 m)   Wt 215 lb (97.5 kg)   SpO2 96%   BMI 35.78 kg/m   BP Readings from Last 3 Encounters:  08/26/19 130/70  04/27/19 110/62  11/26/18 107/63    Wt Readings from Last 3 Encounters:  08/26/19 215 lb (97.5 kg)  04/27/19 209 lb (94.8 kg)  11/26/18 210 lb (95.3 kg)    Physical Exam Vitals signs reviewed.  Constitutional:      Appearance: She is obese.  HENT:     Nose: Nose normal.     Mouth/Throat:     Mouth: Mucous membranes are moist.  Eyes:     General: No scleral icterus.    Conjunctiva/sclera: Conjunctivae normal.  Neck:     Musculoskeletal: Normal range of motion and neck supple.  Cardiovascular:     Rate  and Rhythm: Normal rate and regular rhythm.     Heart sounds: No murmur.  Pulmonary:     Effort: Pulmonary effort is normal.     Breath sounds: No stridor. No wheezing, rhonchi or rales.  Abdominal:     General: Abdomen is protuberant. Bowel sounds are normal.     Palpations: There is no hepatomegaly or splenomegaly.     Tenderness: There is no abdominal tenderness.  Musculoskeletal: Normal range of motion.     Right lower leg: No edema.     Left lower leg: No edema.  Lymphadenopathy:     Cervical: No cervical adenopathy.  Skin:    General: Skin is warm and dry.     Coloration: Skin is not pale.  Neurological:     General: No focal deficit  present.     Mental Status: She is alert.  Psychiatric:        Mood and Affect: Mood normal.        Behavior: Behavior normal.     Lab Results  Component Value Date   WBC 5.4 04/29/2019   HGB 11.9 (L) 04/29/2019   HCT 36.2 04/29/2019   PLT 289.0 04/29/2019   GLUCOSE 180 (H) 04/29/2019   CHOL 187 04/29/2019   TRIG 95.0 04/29/2019   HDL 44.00 04/29/2019   LDLCALC 124 (H) 04/29/2019   ALT 11 04/29/2019   AST 12 04/29/2019   NA 139 04/29/2019   K 3.3 (L) 04/29/2019   CL 102 04/29/2019   CREATININE 1.24 (H) 04/29/2019   BUN 17 04/29/2019   CO2 27 04/29/2019   TSH 2.45 04/29/2019   INR 1.72 (H) 05/11/2010   HGBA1C 7.1 (H) 04/29/2019    Dg Knee Complete 4 Views Left  Result Date: 04/28/2019 CLINICAL DATA:  Fall 1 week ago with generalized pain. EXAM: LEFT KNEE - COMPLETE 4+ VIEW COMPARISON:  None. FINDINGS: No acute fracture or dislocation. Three compartment osteoarthritis, most significant at the patellofemoral articulation. No joint effusion. Enthesophyte at the quadriceps insertion. IMPRESSION: Degenerative change, without acute osseous finding. Electronically Signed   By: Abigail Miyamoto M.D.   On: 04/28/2019 08:10    Assessment & Plan:   Gwennyth was seen today for anemia and diabetes.  Diagnoses and all orders for this visit:  Hypertension, unspecified type- Her blood pressure is adequately well controlled.  I will monitor her electrolytes and renal function. -     Basic metabolic panel; Future -     VITAMIN D 25 Hydroxy (Vit-D Deficiency, Fractures); Future  Type II diabetes mellitus with manifestations ([place])- I will recheck her A1c.  If it goes above 7.5% then I will consider treatment for DM 2. -     Microalbumin / creatinine urine ratio; Future -     Hemoglobin A1c; Future -     HM Diabetes Foot Exam  Deficiency anemia- I will screen her for vitamin deficiencies. -     CBC with Differential/Platelet; Future -     Folate; Future -     Ferritin; Future -     Vitamin  B1; Future  Hypokalemia- I will recheck her potassium level and will treat as indicated. -     Basic metabolic panel; Future -     Magnesium; Future  Need for influenza vaccination -     Flu Vaccine QUAD High Dose(Fluad)   I have discontinued Vaudine J. Hausman's Vitamin D-3, meloxicam, hydrochlorothiazide, and benazepril. I am also having her maintain her vitamin B-12, memantine, metoprolol tartrate,  rosuvastatin, potassium chloride SA, amLODipine, and donepezil.  No orders of the defined types were placed in this encounter.    Follow-up: Return in about 6 months (around 02/24/2020).  Scarlette Calico, MD

## 2019-08-29 LAB — VITAMIN B1: Vitamin B1 (Thiamine): 14 nmol/L (ref 8–30)

## 2019-08-31 ENCOUNTER — Other Ambulatory Visit: Payer: Medicare Other

## 2019-08-31 LAB — MICROALBUMIN / CREATININE URINE RATIO
Creatinine,U: 86.5 mg/dL
Microalb Creat Ratio: 0.8 mg/g (ref 0.0–30.0)
Microalb, Ur: <0.7 mg/dL (ref 0.0–1.9)

## 2019-09-01 ENCOUNTER — Encounter: Payer: Self-pay | Admitting: Internal Medicine

## 2019-09-09 LAB — HM MAMMOGRAPHY

## 2019-10-17 ENCOUNTER — Other Ambulatory Visit: Payer: Self-pay | Admitting: Internal Medicine

## 2019-10-17 DIAGNOSIS — E876 Hypokalemia: Secondary | ICD-10-CM

## 2019-10-17 DIAGNOSIS — E785 Hyperlipidemia, unspecified: Secondary | ICD-10-CM

## 2019-11-08 ENCOUNTER — Other Ambulatory Visit: Payer: Self-pay | Admitting: Adult Health

## 2019-11-30 ENCOUNTER — Encounter: Payer: Self-pay | Admitting: Family Medicine

## 2019-11-30 ENCOUNTER — Ambulatory Visit: Payer: Medicare Other | Admitting: Family Medicine

## 2019-11-30 ENCOUNTER — Other Ambulatory Visit: Payer: Self-pay

## 2019-11-30 VITALS — BP 122/71 | HR 55 | Temp 96.8°F | Ht 66.0 in | Wt 222.6 lb

## 2019-11-30 DIAGNOSIS — F0390 Unspecified dementia without behavioral disturbance: Secondary | ICD-10-CM | POA: Diagnosis not present

## 2019-11-30 MED ORDER — DONEPEZIL HCL 10 MG PO TABS: 10.0000 mg | ORAL_TABLET | Freq: Every day | ORAL | 3 refills | Status: DC

## 2019-11-30 MED ORDER — MEMANTINE HCL 10 MG PO TABS: 10.0000 mg | ORAL_TABLET | Freq: Two times a day (BID) | ORAL | 3 refills | Status: DC

## 2019-11-30 NOTE — Progress Notes (Addendum)
PATIENT: Martha Mata DOB: 03-Jul-1941  REASON FOR VISIT: follow up HISTORY FROM: patient  Chief Complaint  Patient presents with  . Follow-up    RM9. with son. Within the last year she needs more guidance. Having issues with pill box, will move around the medications. Eatting habits changed. leaves food items out.     HISTORY OF PRESENT ILLNESS: Today 11/30/19 Martha Mata is a 79 y.o. female here today for follow up for dementia. She continues Aricept and Namenda. She is doing well. She continues to live alone. She does have a caregiver who comes 4-5 times a week and stays with her. Her son, who lives in [place], [place], comes on the weekends. She is able to move around the home without difficulty. No falls. No assistive devices. She is eating well. She has gained about 10 pounds since last being seen. This is contributed to being at home more and not being as active. She is able to fix a sandwich or warm foods but does not cook. She now requires assistance with medication administration. There were some concerns of her moving around medications in pill container. Caregiver now assists with administration and medications are secured. She does not drive.  Memory care center is back operating three days a week. Her son feels that, overall, she is doing very well.    HISTORY: (copied from [place] note on 11/26/2018)  Ms. Endress is a 79 year old female with a history of dementia.  She returns today for follow-up.  She remains on Aricept and Namenda.  She currently lives at home.  She does have a caregiver that is with her in the morning that she goes to a adult day center during the day.  She requires assistance with all ADLs.  She no longer prepares any meals but she is able to make sandwiches and heat up food. her family help manage her finances.  The caregiver and her son helps with her medications.  No significant change in her mood or behavior.  Denies any trouble sleeping.   No hallucinations.  She returns today for evaluation.  HISTORY /05/2018: Here for follow up on dementia. Stable on Namenda and Aricept. She is here with her son. Lives at home with daily aid. No drastic changes. No falls. Patient feels happy and she feels good. Appetite is good. Sleeping is good. Here with son and caretaker. Lives with son and has a caretaker. She goes to a Health visitor 3x a week.   05/27/2017:[name] is a 79 year old female with a history of memory disturbance. She returns today for follow-up. She is here today with her son. She reports that she continues to live at home. She does have caregivers that checks on her frequently. She does stay at home alone at night. She does have a life alert necklace that she can use it needed. Her son denies any episodes of wandering. Patient is able to complete all ADLs independently. Her son reports that there is limited cooking. She does cook bacon and and soups. He denies any trouble using the stove. Her son manages all of her finances. She denies any trouble sleeping. Denies hallucinations. Denies any changes with her mood or behavior. She does usepublic transportationto go to a connections group. She returns today for an evaluation.  11/26/2016:[name] is a 79 year old female with a history of memory disturbance. She returns today for follow-up. She is here today with her caretaker and son. They report that her  memory has remained stable. She continues to live at home alone. She does have caregivers that check on her throughout the day. She reports that she is able to complete most ADLs independently. She doesprepareher own meals without difficulty. Her son helps her with her finances. She does not operate a motor vehicle. Denies any trouble sleeping. Denies getting up at night wandering around the house. Denies hallucinations. Denies restlessness or agitation. Overall she feels that she is doing well. Family agrees with this.  She is on Aricept and tolerating it well. She returns today for an evaluation.  [id number] J Buchanonis a 79 y.o.femalehere as a referral from Dr. Anthoney Harada memory loss. Here with caretaker who provides information. She is here as a follow up. Memory changes started about 3 years ago. Slowly progressive, slowly getting worse. Started with short-term memory problems, repeating conversations. Caretaker says she loses her keys, she had to put a key rack in her room, she forgets things she is told, asks the same questions over and over again, forgets conversations, she cooks quick things doesn't cook big meals anymore. Caretaker is there a few hours a day. She lives alone in a condo. Caretaker does the driving, no accidents in the home, no falls. Son keep a camera in the house to watch her and make sure she is safe, he lives in [place]. Discussed assisted living close to son, recommended that she look into it. No hallucinations, agitation, no issues at all. No depression, no behavioral problems, she is still very social, goes to the [employer] weekly. Son has taken over finances and caring for the house in the last few years. She takes OTC b12. TSH has been normal in the past.   Reviewed notes, labs and imaging from outside physicians, which showed:  CBC nml, CMP with creatinine is 0.86 normal labs were taken 03/06/2016, LDL 104. TSH in the past has been normal. Patient takes daily B12 supplementation so we'll not check B12.  MRI of the brain 09/07/2013 (personally reviewed imaging and agree with the following):  Mildly abnormal MRI brain (without) demonstrating: 1. Few scattered foci of non-specific gliosis in the subcortical and juxtacortical white matter.  2. Single left occipital punctate focus of SWI hypointensity, may represent a cerebral microhemorrhage. 3. Above findings may be related to underlying mild chronic small vessel ischemic disease.   REVIEW OF SYSTEMS: Out of a  complete 14 system review of symptoms, the patient complains only of the following symptoms, none and all other reviewed systems are negative.  ALLERGIES: No Known Allergies  HOME MEDICATIONS: Outpatient Medications Prior to Visit  Medication Sig Dispense Refill  . cyanocobalamin 500 MCG tablet Take 500 mcg by mouth daily.    Marland Kitchen KLOR-CON M20 20 MEQ tablet TAKE 1 TABLET BY MOUTH EVERY DAY 90 tablet 1  . metoprolol tartrate (LOPRESSOR) 100 MG tablet Take 1 tablet (100 mg total) by mouth 2 (two) times daily. Need to establish with new provider for future refills. 180 tablet 0  . rosuvastatin (CRESTOR) 20 MG tablet TAKE 1 TABLET BY MOUTH EVERY DAY 90 tablet 1  . donepezil (ARICEPT) 10 MG tablet TAKE 1 TABLET BY MOUTH EVERY DAY 90 tablet 1  . memantine (NAMENDA) 10 MG tablet TAKE 1 TABLET BY MOUTH TWICE A DAY 180 tablet 0  . amLODipine (NORVASC) 5 MG tablet Take 1 tablet (5 mg total) by mouth daily. (Patient not taking: Reported on 11/30/2019) 90 tablet 1   No facility-administered medications prior to visit.  PAST MEDICAL HISTORY: Past Medical History:  Diagnosis Date  . Fibroid, uterine   . GERD (gastroesophageal reflux disease)    no meds  . Hypertension   . Memory loss of unknown cause    per pt's admission  . Vitamin D deficiency disease     PAST SURGICAL HISTORY: Past Surgical History:  Procedure Laterality Date  . CERVIX LESION DESTRUCTION    . COLONOSCOPY  2005  . DILATATION & CURRETTAGE/HYSTEROSCOPY WITH RESECTOCOPE N/A 08/14/2013   Procedure: DILATATION & CURETTAGE/HYSTEROSCOPY WITH RESECTOCOPE;  Surgeon: Marvene Staff, MD;  Location: [facility];  Service: Gynecology;  Laterality: N/A;  . DILATION AND CURETTAGE OF UTERUS    . HYSTEROSCOPY    . KNEE ARTHROCENTESIS  2010  . TUBAL LIGATION    . UTERINE FIBROID SURGERY  2005    FAMILY HISTORY: No family history on file.  SOCIAL HISTORY: Social History   Socioeconomic History  . Marital status: Widowed    Spouse  name: Not on file  . Number of children: 2  . Years of education: 12+  . Highest education level: Not on file  Occupational History  . Occupation: Retired  Tobacco Use  . Smoking status: Never Smoker  . Smokeless tobacco: Never Used  Substance and Sexual Activity  . Alcohol use: Yes    Comment: occasionally,one glass of wine monthly  . Drug use: No  . Sexual activity: Not on file  Other Topics Concern  . Not on file  Social History Narrative   Patient is retired.    Patient is a non-smoker    Patient lives at home alone. Has a caretaker who comes in to help as needed.   Patient consumes tea and coffee (1-2 cups daily)   Patient is right handed.   Patient has a college education.   Patient has two children.   Social Determinants of Health   Financial Resource Strain:   . Difficulty of Paying Living Expenses: Not on file  Food Insecurity:   . Worried About Charity fundraiser in the Last Year: Not on file  . Ran Out of Food in the Last Year: Not on file  Transportation Needs:   . Lack of Transportation (Medical): Not on file  . Lack of Transportation (Non-Medical): Not on file  Physical Activity:   . Days of Exercise per Week: Not on file  . Minutes of Exercise per Session: Not on file  Stress:   . Feeling of Stress : Not on file  Social Connections:   . Frequency of Communication with Friends and Family: Not on file  . Frequency of Social Gatherings with Friends and Family: Not on file  . Attends Religious Services: Not on file  . Active Member of Clubs or Organizations: Not on file  . Attends Archivist Meetings: Not on file  . Marital Status: Not on file  Intimate Partner Violence:   . Fear of Current or Ex-Partner: Not on file  . Emotionally Abused: Not on file  . Physically Abused: Not on file  . Sexually Abused: Not on file      PHYSICAL EXAM  Vitals:   11/30/19 0919  BP: 122/71  Pulse: (!) 55  Temp: (!) 96.8 F (36 C)  Weight: 222 lb 9.6  oz (101 kg)  Height: 5\' 6"  (1.676 m)   Body mass index is 35.93 kg/m.  Generalized: Well developed, in no acute distress  Cardiology: normal rate and rhythm, no murmur noted Respiratory: clear  to auscultation bilaterally  Neurological examination  Mentation: Alert, not oriented to time, place, or history taking. Follows intermittent commands speech and language fluent Cranial nerve II-XII: Pupils were equal round reactive to light. Extraocular movements were full, visual field were full on confrontational test. Facial sensation and strength were normal.  Motor: The motor testing reveals 5 over 5 strength of all 4 extremities. Good symmetric motor tone is noted throughout.  Sensory: Sensory testing is intact to soft touch on all 4 extremities. No evidence of extinction is noted.  Coordination: Cerebellar testing reveals good finger-nose-finger and heel-to-shin bilaterally.  Gait and station: Gait is short but stable    DIAGNOSTIC DATA (LABS, IMAGING, TESTING) - I reviewed patient records, labs, notes, testing and imaging myself where available.  MMSE - Mini Mental State Exam 11/26/2018 05/27/2017 11/26/2016  Not completed: (No Data) - -  Orientation to time 0 0 3  Orientation to Place 0 2 2  Registration 3 3 3   Attention/ Calculation 0 0 0  Recall 0 1 0  Language- name 2 objects 1 2 2   Language- repeat 1 0 1  Language- follow 3 step command 2 2 3   Language- read & follow direction 0 1 1  Write a sentence 0 0 1  Copy design 0 0 0  Total score 7 11 16      Lab Results  Component Value Date   WBC 5.9 08/26/2019   HGB 12.4 08/26/2019   HCT 37.5 08/26/2019   MCV 93.6 08/26/2019   PLT 265.0 08/26/2019      Component Value Date/Time   NA 141 08/26/2019 1336   K 4.2 08/26/2019 1336   CL 105 08/26/2019 1336   CO2 27 08/26/2019 1336   GLUCOSE 147 (H) 08/26/2019 1336   BUN 14 08/26/2019 1336   CREATININE 1.08 08/26/2019 1336   CALCIUM 9.7 08/26/2019 1336   PROT 7.1 04/29/2019 1148    ALBUMIN 3.6 04/29/2019 1148   AST 12 04/29/2019 1148   ALT 11 04/29/2019 1148   ALKPHOS 65 04/29/2019 1148   BILITOT 0.4 04/29/2019 1148   GFRNONAA 48 (L) 08/22/2017 0850   GFRAA 55 (L) 08/22/2017 0850   Lab Results  Component Value Date   CHOL 187 04/29/2019   HDL 44.00 04/29/2019   LDLCALC 124 (H) 04/29/2019   TRIG 95.0 04/29/2019   CHOLHDL 4 04/29/2019   Lab Results  Component Value Date   HGBA1C 7.2 (H) 08/26/2019   Lab Results  Component Value Date   VITAMINB12 >1999 (H) 08/11/2013   Lab Results  Component Value Date   TSH 2.45 04/29/2019       ASSESSMENT AND PLAN 79 y.o. year old female  has a past medical history of Fibroid, uterine, GERD (gastroesophageal reflux disease), Hypertension, Memory loss of unknown cause, and Vitamin D deficiency disease. here with     ICD-10-CM   1. Dementia without behavioral disturbance, unspecified dementia type (HCC)  F03.90 memantine (NAMENDA) 10 MG tablet    donepezil (ARICEPT) 10 MG tablet    Overall, Kaicee is doing well. We will continue Aricpet and Namenda. We have discussed progression of dementia. Her son is aware that safety is a priority at this time. I do recommend continued assistance with administration of medications and securing of medications to limit patient's ability to change those in pill container. They have security cameras in the home and support with home monitoring from caregiver and neighbors. No driving. Fall precautions discussed. She will continue close follow up  with PCP. She will follow up with me in 1 year, sooner if needed. Her son verbalizes understanding and agreement with this plan.    No orders of the defined types were placed in this encounter.    Meds ordered this encounter  Medications  . memantine (NAMENDA) 10 MG tablet    Sig: Take 1 tablet (10 mg total) by mouth 2 (two) times daily.    Dispense:  180 tablet    Refill:  3    Order Specific Question:   Supervising Provider    Answer:    Melvenia Beam [id number]  . donepezil (ARICEPT) 10 MG tablet    Sig: Take 1 tablet (10 mg total) by mouth daily.    Dispense:  90 tablet    Refill:  3    Order Specific Question:   Supervising Provider    Answer:   Melvenia Beam [id number]      I spent 25 minutes with the patient. 50% of this time was spent counseling and educating patient on plan of care and medications.     Debbora Presto, FNP-C 11/30/2019, 10:03 AM [facility] [address], [place], [place] [address] [phone]  Made any corrections needed, and agree with history, physical, neuro exam,assessment and plan as stated.     Sarina Ill, MD [facility]

## 2019-11-30 NOTE — Patient Instructions (Signed)
Continue Aricept and Namenda as prescribed   Follow up in 1 year, sooner if needed   Dementia Caregiver Guide Dementia is a term used to describe a number of symptoms that affect memory and thinking. The most common symptoms include:  Memory loss.  Trouble with language and communication.  Trouble concentrating.  Poor judgment.  Problems with reasoning.  Child-like behavior and language.  Extreme anxiety.  Angry outbursts.  Wandering from home or public places. Dementia usually gets worse slowly over time. In the early stages, people with dementia can stay independent and safe with some help. In later stages, they need help with daily tasks such as dressing, grooming, and using the bathroom. How to help the person with dementia cope Dementia can be frightening and confusing. Here are some tips to help the person with dementia cope with changes caused by the disease. General tips  Keep the person on track with his or her routine.  Try to identify areas where the person may need help.  Be supportive, patient, calm, and encouraging.  Gently remind the person that adjusting to changes takes time.  Help with the tasks that the person has asked for help with.  Keep the person involved in daily tasks and decisions as much as possible.  Encourage conversation, but try not to get frustrated or harried if the person struggles to find words or does not seem to appreciate your help. Communication tips  When the person is talking or seems frustrated, make eye contact and hold the person's hand.  Ask specific questions that need yes or no answers.  Use simple words, short sentences, and a calm voice. Only give one direction at a time.  When offering choices, limit them to just 1 or 2.  Avoid correcting the person in a negative way.  If the person is struggling to find the right words, gently try to help him or her. How to recognize symptoms of stress Symptoms of stress in  caregivers include:  Feeling frustrated or angry with the person with dementia.  Denying that the person has dementia or that his or her symptoms will not improve.  Feeling hopeless and unappreciated.  Difficulty sleeping.  Difficulty concentrating.  Feeling anxious, irritable, or depressed.  Developing stress-related health problems.  Feeling like you have too little time for your own life. Follow these instructions at home:   Make sure that you and the person you are caring for: ? Get regular sleep. ? Exercise regularly. ? Eat regular, nutritious meals. ? Drink enough fluid to keep your urine clear or pale yellow. ? Take over-the-counter and prescription medicines only as told by your health care providers. ? Attend all scheduled health care appointments.  Join a support group with others who are caregivers.  Ask about respite care resources so that you can have a regular break from the stress of caregiving.  Look for signs of stress in yourself and in the person you are caring for. If you notice signs of stress, take steps to manage it.  Consider any safety risks and take steps to avoid them.  Organize medications in a pill box for each day of the week.  Create a plan to handle any legal or financial matters. Get legal or financial advice if needed.  Keep a calendar in a central location to remind the person of appointments or other activities. Tips for reducing the risk of injury  Keep floors clear of clutter. Remove rugs, magazine racks, and floor lamps.  Keep hallways well lit, especially at night.  Put a handrail and nonslip mat in the bathtub or shower.  Put childproof locks on cabinets that contain dangerous items, such as medicines, alcohol, guns, toxic cleaning items, sharp tools or utensils, matches, and lighters.  Put the locks in places where the person cannot see or reach them easily. This will help ensure that the person does not wander out of the  house and get lost.  Be prepared for emergencies. Keep a list of emergency phone numbers and addresses in a convenient area.  Remove car keys and lock garage doors so that the person does not try to get in the car and drive.  Have the person wear a bracelet that tracks locations and identifies the person as having memory problems. This should be worn at all times for safety. Where to find support: Many individuals and organizations offer support. These include:  Support groups for people with dementia and for caregivers.  Counselors or therapists.  Home health care services.  Adult day care centers. Where to find more information Alzheimer's Association: [employer] Contact a health care provider if:  The person's health is rapidly getting worse.  You are no longer able to care for the person.  Caring for the person is affecting your physical and emotional health.  The person threatens himself or herself, you, or anyone else. Summary  Dementia is a term used to describe a number of symptoms that affect memory and thinking.  Dementia usually gets worse slowly over time.  Take steps to reduce the person's risk of injury, and to plan for future care.  Caregivers need support, relief from caregiving, and time for their own lives. This information is not intended to replace advice given to you by your health care provider. Make sure you discuss any questions you have with your health care provider. Document Revised: 10/18/2017 Document Reviewed: 10/09/2016 Elsevier Patient Education  2020 [employer].

## 2019-12-22 ENCOUNTER — Other Ambulatory Visit: Payer: Self-pay | Admitting: Internal Medicine

## 2019-12-22 DIAGNOSIS — I10 Essential (primary) hypertension: Secondary | ICD-10-CM

## 2020-01-15 ENCOUNTER — Encounter: Payer: Self-pay | Admitting: Internal Medicine

## 2020-01-17 NOTE — Progress Notes (Signed)
Subjective:    Patient ID: Martha Mata, female    DOB: October 13, 1941, 79 y.o.   MRN: [id number]  HPI The patient is here for an acute visit for leg swelling.  Her caregiver is here with her due to her dementia and she provides the history.  BL leg edema:  It started about 2 weeks ago. Her caregiver denies changes in medication, salt intake or medications.  She goes to adult daycare 3/week.  Her caregiver and patient (who does not seem to be a reliable historian) deny SOB, chest pain, palpitations.   With further prodding she lives alone and has a caregiver during the day.  She eats lunch meat daily.  She has gained a good amount of weight since her last visit.  They are not sure how much she eats when no one is with her or what she eats.    Echo 2018:  EF 60-65%, grade 2 DD  Medications and allergies reviewed with patient and updated if appropriate.  Patient Active Problem List   Diagnosis Date Noted  . Bilateral edema of lower extremity 01/18/2020  . Type II diabetes mellitus with manifestations ([place]) 08/26/2019  . Deficiency anemia 08/26/2019  . Routine general medical examination at a health care facility 04/27/2019  . Hyperlipidemia LDL goal <130 04/27/2019  . Post-traumatic osteoarthritis of left knee 04/27/2019  . Hypertension 04/25/2018  . Alzheimer's disease ([place]) 05/17/2016    Current Outpatient Medications on File Prior to Visit  Medication Sig Dispense Refill  . amLODipine (NORVASC) 5 MG tablet TAKE 1 TABLET BY MOUTH EVERY DAY 90 tablet 1  . cyanocobalamin 500 MCG tablet Take 500 mcg by mouth daily.    Marland Kitchen donepezil (ARICEPT) 10 MG tablet Take 1 tablet (10 mg total) by mouth daily. 90 tablet 3  . KLOR-CON M20 20 MEQ tablet TAKE 1 TABLET BY MOUTH EVERY DAY 90 tablet 1  . memantine (NAMENDA) 10 MG tablet Take 1 tablet (10 mg total) by mouth 2 (two) times daily. 180 tablet 3  . metoprolol tartrate (LOPRESSOR) 100 MG tablet TAKE 1 TABLET BY MOUTH 2 TIMES DAILY 180  tablet 0  . rosuvastatin (CRESTOR) 20 MG tablet TAKE 1 TABLET BY MOUTH EVERY DAY 90 tablet 1  . [DISCONTINUED] amLODipine (NORVASC) 5 MG tablet Take 1 tablet (5 mg total) by mouth daily. (Patient not taking: Reported on 11/30/2019) 90 tablet 1   No current facility-administered medications on file prior to visit.    Past Medical History:  Diagnosis Date  . Fibroid, uterine   . GERD (gastroesophageal reflux disease)    no meds  . Hypertension   . Memory loss of unknown cause    per pt's admission  . Vitamin D deficiency disease     Past Surgical History:  Procedure Laterality Date  . CERVIX LESION DESTRUCTION    . COLONOSCOPY  2005  . DILATATION & CURRETTAGE/HYSTEROSCOPY WITH RESECTOCOPE N/A 08/14/2013   Procedure: DILATATION & CURETTAGE/HYSTEROSCOPY WITH RESECTOCOPE;  Surgeon: Marvene Staff, MD;  Location: [facility];  Service: Gynecology;  Laterality: N/A;  . DILATION AND CURETTAGE OF UTERUS    . HYSTEROSCOPY    . KNEE ARTHROCENTESIS  2010  . TUBAL LIGATION    . UTERINE FIBROID SURGERY  2005    Social History   Socioeconomic History  . Marital status: Widowed    Spouse name: Not on file  . Number of children: 2  . Years of education: 12+  . Highest education level: Not  on file  Occupational History  . Occupation: Retired  Tobacco Use  . Smoking status: Never Smoker  . Smokeless tobacco: Never Used  Substance and Sexual Activity  . Alcohol use: Yes    Comment: occasionally,one glass of wine monthly  . Drug use: No  . Sexual activity: Not on file  Other Topics Concern  . Not on file  Social History Narrative   Patient is retired.    Patient is a non-smoker    Patient lives at home alone. Has a caretaker who comes in to help as needed.   Patient consumes tea and coffee (1-2 cups daily)   Patient is right handed.   Patient has a college education.   Patient has two children.   Social Determinants of Health   Financial Resource Strain:   . Difficulty of  Paying Living Expenses: Not on file  Food Insecurity:   . Worried About Charity fundraiser in the Last Year: Not on file  . Ran Out of Food in the Last Year: Not on file  Transportation Needs:   . Lack of Transportation (Medical): Not on file  . Lack of Transportation (Non-Medical): Not on file  Physical Activity:   . Days of Exercise per Week: Not on file  . Minutes of Exercise per Session: Not on file  Stress:   . Feeling of Stress : Not on file  Social Connections:   . Frequency of Communication with Friends and Family: Not on file  . Frequency of Social Gatherings with Friends and Family: Not on file  . Attends Religious Services: Not on file  . Active Member of Clubs or Organizations: Not on file  . Attends Archivist Meetings: Not on file  . Marital Status: Not on file    History reviewed. No pertinent family history.  Review of Systems  Constitutional: Negative for chills and fever.  Respiratory: Negative for cough, shortness of breath and wheezing.   Cardiovascular: Positive for leg swelling. Negative for chest pain and palpitations.  Neurological: Negative for dizziness, light-headedness and headaches.       Objective:   Vitals:   01/18/20 1302  BP: (!) 148/72  Pulse: 69  Resp: 16  Temp: 98.2 F (36.8 C)  SpO2: 99%   BP Readings from Last 3 Encounters:  01/18/20 (!) 148/72  11/30/19 122/71  08/26/19 130/70   Wt Readings from Last 3 Encounters:  01/18/20 238 lb (108 kg)  11/30/19 222 lb 9.6 oz (101 kg)  08/26/19 215 lb (97.5 kg)   Body mass index is 38.41 kg/m.   Physical Exam    Constitutional: Appears well-developed and well-nourished. No distress.  Head: Normocephalic and atraumatic.  Neck: Neck supple. No tracheal deviation present. No thyromegaly present.  No cervical lymphadenopathy Cardiovascular: Normal rate, regular rhythm and normal heart sounds.  No murmur heard. No carotid bruit .  1+ b/l LE edema  Both legs are very tight, no  breaks in skin or weeping.  Pulmonary/Chest: Effort normal and breath sounds normal. No respiratory distress. No has no wheezes. No rales.  Skin: Skin is warm and dry. Not diaphoretic.        Assessment & Plan:    See Problem List for Assessment and Plan of chronic medical problems.    This visit occurred during the SARS-CoV-2 public health emergency.  Safety protocols were in place, including screening questions prior to the visit, additional usage of staff PPE, and extensive cleaning of exam room while observing  appropriate contact time as indicated for disinfecting solutions.

## 2020-01-18 ENCOUNTER — Encounter: Payer: Self-pay | Admitting: Internal Medicine

## 2020-01-18 ENCOUNTER — Ambulatory Visit (INDEPENDENT_AMBULATORY_CARE_PROVIDER_SITE_OTHER): Payer: Medicare Other | Admitting: Internal Medicine

## 2020-01-18 ENCOUNTER — Other Ambulatory Visit: Payer: Self-pay

## 2020-01-18 VITALS — BP 148/72 | HR 69 | Temp 98.2°F | Resp 16 | Ht 66.0 in | Wt 238.0 lb

## 2020-01-18 DIAGNOSIS — R600 Localized edema: Secondary | ICD-10-CM | POA: Diagnosis not present

## 2020-01-18 MED ORDER — FUROSEMIDE 20 MG PO TABS: 20.0000 mg | ORAL_TABLET | Freq: Every day | ORAL | 3 refills | Status: DC

## 2020-01-18 NOTE — Assessment & Plan Note (Signed)
Acute for two weeks Weight gain and increased salt intake are likely contributing No symptoms c/w CHF Advised revising diet, decreased portions, which she will need help with - work on weight loss Elevate legs which her caregiver does not think will happen Lasix 40 mg x 1 tomorrow then 20 mg daily Continue current KCl dose CMP, tsh today F/u in one week with PCP

## 2020-01-18 NOTE — Patient Instructions (Addendum)
Take two lasix tomorrow and one tab daily in the morning thereafter.      Follow up in next week with Dr Ronnald Ramp.   She needs to decrease her salt intake and overall food intake.  Weight loss is ideal.     Try to elevate your legs when sitting.

## 2020-01-28 ENCOUNTER — Encounter: Payer: Self-pay | Admitting: Internal Medicine

## 2020-01-28 ENCOUNTER — Other Ambulatory Visit: Payer: Self-pay

## 2020-01-28 ENCOUNTER — Ambulatory Visit (INDEPENDENT_AMBULATORY_CARE_PROVIDER_SITE_OTHER): Payer: Medicare Other | Admitting: Internal Medicine

## 2020-01-28 VITALS — BP 140/70 | HR 70 | Temp 98.5°F | Resp 16 | Ht 66.0 in | Wt 233.0 lb

## 2020-01-28 DIAGNOSIS — I872 Venous insufficiency (chronic) (peripheral): Secondary | ICD-10-CM | POA: Diagnosis not present

## 2020-01-28 DIAGNOSIS — E118 Type 2 diabetes mellitus with unspecified complications: Secondary | ICD-10-CM | POA: Diagnosis not present

## 2020-01-28 DIAGNOSIS — I10 Essential (primary) hypertension: Secondary | ICD-10-CM

## 2020-01-28 LAB — BASIC METABOLIC PANEL
BUN: 15 mg/dL (ref 6–23)
CO2: 28 mEq/L (ref 19–32)
Calcium: 9.2 mg/dL (ref 8.4–10.5)
Chloride: 107 mEq/L (ref 96–112)
Creatinine, Ser: 1.08 mg/dL (ref 0.40–1.20)
GFR: 59.17 mL/min — ABNORMAL LOW (ref >60.00)
Glucose, Bld: 183 mg/dL — ABNORMAL HIGH (ref 70–99)
Potassium: 4.1 mEq/L (ref 3.5–5.1)
Sodium: 142 mEq/L (ref 135–145)

## 2020-01-28 LAB — HEMOGLOBIN A1C: Hgb A1c MFr Bld: 7.3 % — ABNORMAL HIGH (ref 4.6–6.5)

## 2020-01-28 MED ORDER — AMLODIPINE BESYLATE 5 MG PO TABS: 5.0000 mg | ORAL_TABLET | Freq: Every day | ORAL | Status: DC

## 2020-01-28 NOTE — Progress Notes (Signed)
Subjective:  Patient ID: Martha Mata, female    DOB: 1941-08-29  Age: 79 y.o. MRN: [id number]  CC: Hypertension, Diabetes, and Rash  This visit occurred during the SARS-CoV-2 public health emergency.  Safety protocols were in place, including screening questions prior to the visit, additional usage of staff PPE, and extensive cleaning of exam room while observing appropriate contact time as indicated for disinfecting solutions.     HPI Martha Mata presents for f/up - She is with her caregiver today.  I also spoke to her son on the phone.  They tell me she thinks she is doing well.  There is not much history to be obtained from Ms. Martha Mata due to dementia.  They are concerned about the appearance of her lower legs.  They have noticed red and dark spots.  They do not think this bother her as she does not itch or scratch the area.  There is nothing being applied to treat it.  Outpatient Medications Prior to Visit  Medication Sig Dispense Refill  . cyanocobalamin 500 MCG tablet Take 500 mcg by mouth daily.    Marland Kitchen donepezil (ARICEPT) 10 MG tablet Take 1 tablet (10 mg total) by mouth daily. 90 tablet 3  . furosemide (LASIX) 20 MG tablet Take 1 tablet (20 mg total) by mouth daily. 30 tablet 3  . KLOR-CON M20 20 MEQ tablet TAKE 1 TABLET BY MOUTH EVERY DAY 90 tablet 1  . memantine (NAMENDA) 10 MG tablet Take 1 tablet (10 mg total) by mouth 2 (two) times daily. 180 tablet 3  . metoprolol tartrate (LOPRESSOR) 100 MG tablet TAKE 1 TABLET BY MOUTH 2 TIMES DAILY 180 tablet 0  . rosuvastatin (CRESTOR) 20 MG tablet TAKE 1 TABLET BY MOUTH EVERY DAY 90 tablet 1  . amLODipine (NORVASC) 5 MG tablet Take 5 mg by mouth daily.    Marland Kitchen amLODipine (NORVASC) 5 MG tablet TAKE 1 TABLET BY MOUTH EVERY DAY 90 tablet 1   No facility-administered medications prior to visit.    ROS Review of Systems  Constitutional: Negative for chills, diaphoresis, fatigue and fever.  HENT: Negative.   Respiratory:  Negative for cough, choking and shortness of breath.   Cardiovascular: Negative for leg swelling.  Gastrointestinal: Negative for abdominal pain, diarrhea and nausea.  Genitourinary: Negative.  Negative for difficulty urinating.  Musculoskeletal: Negative.   Skin: Positive for color change and rash.  Neurological: Negative.  Negative for dizziness, weakness and light-headedness.  Hematological: Negative for adenopathy. Does not bruise/bleed easily.  Psychiatric/Behavioral: Negative.     Objective:  BP 140/70 (BP Location: Left Arm, Patient Position: Sitting, Cuff Size: Large)   Pulse 70   Temp 98.5 F (36.9 C) (Oral)   Resp 16   Ht 5\' 6"  (1.676 m)   Wt 233 lb (105.7 kg)   SpO2 98%   BMI 37.61 kg/m   BP Readings from Last 3 Encounters:  01/28/20 140/70  01/18/20 (!) 148/72  11/30/19 122/71    Wt Readings from Last 3 Encounters:  01/28/20 233 lb (105.7 kg)  01/18/20 238 lb (108 kg)  11/30/19 222 lb 9.6 oz (101 kg)    Physical Exam Vitals reviewed.  Constitutional:      Appearance: She is obese.  HENT:     Mouth/Throat:     Mouth: Mucous membranes are moist.  Eyes:     General: No scleral icterus. Cardiovascular:     Rate and Rhythm: Normal rate and regular rhythm.  Heart sounds: No murmur.  Pulmonary:     Effort: Pulmonary effort is normal.     Breath sounds: No stridor. No wheezing, rhonchi or rales.  Abdominal:     General: Abdomen is protuberant. Bowel sounds are normal. There is no distension.     Palpations: Abdomen is soft. There is no hepatomegaly, splenomegaly or mass.  Musculoskeletal:     Cervical back: Neck supple.  Lymphadenopathy:     Cervical: No cervical adenopathy.  Skin:    General: Skin is warm and dry.     Coloration: Skin is not jaundiced.     Findings: Rash present.     Comments: Symmetrically over both lower extremities, more prominently medially than elsewhere there is brawny induration with a few areas of warmth and erythema.   There are no ulcers or breakdown.  There are no exudates.  There is no induration, fluctuance, or streaking. See photos.  Neurological:     General: No focal deficit present.     Mental Status: She is alert.  Psychiatric:        Mood and Affect: Mood normal.        Behavior: Behavior normal.     Lab Results  Component Value Date   WBC 5.9 08/26/2019   HGB 12.4 08/26/2019   HCT 37.5 08/26/2019   PLT 265.0 08/26/2019   GLUCOSE 183 (H) 01/28/2020   CHOL 187 04/29/2019   TRIG 95.0 04/29/2019   HDL 44.00 04/29/2019   LDLCALC 124 (H) 04/29/2019   ALT 11 04/29/2019   AST 12 04/29/2019   NA 142 01/28/2020   K 4.1 01/28/2020   CL 107 01/28/2020   CREATININE 1.08 01/28/2020   BUN 15 01/28/2020   CO2 28 01/28/2020   TSH 2.45 04/29/2019   INR 1.72 (H) 05/11/2010   HGBA1C 7.3 (H) 01/28/2020   MICROALBUR <0.7 08/26/2019    DG Knee Complete 4 Views Left  Result Date: 04/28/2019 CLINICAL DATA:  Fall 1 week ago with generalized pain. EXAM: LEFT KNEE - COMPLETE 4+ VIEW COMPARISON:  None. FINDINGS: No acute fracture or dislocation. Three compartment osteoarthritis, most significant at the patellofemoral articulation. No joint effusion. Enthesophyte at the quadriceps insertion. IMPRESSION: Degenerative change, without acute osseous finding. Electronically Signed   By: Abigail Miyamoto M.D.   On: 04/28/2019 08:10    Assessment & Plan:   Lindell was seen today for hypertension, diabetes and rash.  Diagnoses and all orders for this visit:  Hypertension, unspecified type- Her blood pressure is adequately well controlled.  Electrolytes and renal function are normal.  Will continue the current antihypertensives. -     Basic metabolic panel  Type II diabetes mellitus with manifestations ([place])- Her blood sugar is adequately well controlled.  Medical therapy is not indicated. -     Basic metabolic panel -     Hemoglobin A1c -     Ambulatory referral to Ophthalmology  Venous stasis dermatitis of  both lower extremities- I recommended she treat this with Vasculera to reduce the risk of complications like ulcers and infection. -     Dietary Management Product (VASCULERA) TABS; Take 1 capsule by mouth daily.  Other orders -     amLODipine (NORVASC) 5 MG tablet; Take 1 tablet (5 mg total) by mouth daily.   I have discontinued Orlando J. Garza's amLODipine. I have also changed her amLODipine. Additionally, I am having her start on Vasculera. Lastly, I am having her maintain her vitamin B-12, Klor-Con M20, rosuvastatin, memantine,  donepezil, metoprolol tartrate, and furosemide.  Meds ordered this encounter  Medications  . amLODipine (NORVASC) 5 MG tablet    Sig: Take 1 tablet (5 mg total) by mouth daily.    Dispense:     . Dietary Management Product (VASCULERA) TABS    Sig: Take 1 capsule by mouth daily.    Dispense:  30 tablet    Refill:  11     Follow-up: Return in about 6 months (around 07/30/2020).  Scarlette Calico, MD

## 2020-01-28 NOTE — Patient Instructions (Signed)

## 2020-01-29 ENCOUNTER — Other Ambulatory Visit: Payer: Self-pay | Admitting: Internal Medicine

## 2020-01-29 DIAGNOSIS — M1732 Unilateral post-traumatic osteoarthritis, left knee: Secondary | ICD-10-CM

## 2020-01-29 MED ORDER — VASCULERA PO TABS: 1.0000 | ORAL_TABLET | Freq: Every day | ORAL | 11 refills | Status: DC

## 2020-01-30 ENCOUNTER — Encounter: Payer: Self-pay | Admitting: Internal Medicine

## 2020-02-01 ENCOUNTER — Telehealth: Payer: Self-pay

## 2020-02-01 DIAGNOSIS — I872 Venous insufficiency (chronic) (peripheral): Secondary | ICD-10-CM

## 2020-02-01 NOTE — Telephone Encounter (Deleted)
error 

## 2020-02-01 NOTE — Telephone Encounter (Signed)
1.Medication Requested: Dietary Management Product (VASCULERA) TABS  2. Pharmacy (Name, [place], City):[facility], [place]  3. On Med List: Yes   4. Last Visit with PCP: 3.11.21  5. Next visit date with PCP: no appt made at this time    Agent: Please be advised that RX refills may take up to 3 business days. We ask that you follow-up with your pharmacy.

## 2020-02-02 ENCOUNTER — Encounter: Payer: Self-pay | Admitting: Internal Medicine

## 2020-02-02 MED ORDER — VASCULERA PO TABS: 1.0000 | ORAL_TABLET | Freq: Every day | ORAL | 11 refills | Status: DC

## 2020-02-02 NOTE — Telephone Encounter (Signed)
Erx has been sent. 

## 2020-03-14 ENCOUNTER — Telehealth: Payer: Self-pay | Admitting: Internal Medicine

## 2020-03-14 DIAGNOSIS — E118 Type 2 diabetes mellitus with unspecified complications: Secondary | ICD-10-CM

## 2020-03-14 NOTE — Chronic Care Management (AMB) (Signed)
  Chronic Care Management   Note  03/14/2020 Name: Martha Mata MRN: [id number] DOB: 1941/03/20  Martha Mata is a 79 y.o. year old female who is a primary care patient of Janith Lima, MD. I reached out to Rudie Meyer by phone today in response to a referral sent by Martha Mata's PCP, Janith Lima, MD.   Martha Mata was given information about Chronic Care Management services today including:  1. CCM service includes personalized support from designated clinical staff supervised by her physician, including individualized plan of care and coordination with other care providers 2. 24/7 contact phone numbers for assistance for urgent and routine care needs. 3. Service will only be billed when office clinical staff spend 20 minutes or more in a month to coordinate care. 4. Only one practitioner may furnish and bill the service in a calendar month. 5. The patient may stop CCM services at any time (effective at the end of the month) by phone call to the office staff.   Patient agreed to services and verbal consent obtained.   This note is not being shared with the patient for the following reason: To respect privacy (The patient or proxy has requested that the information not be shared). Follow up plan:   Raynicia Dukes [employer] Scheduler

## 2020-03-22 ENCOUNTER — Other Ambulatory Visit: Payer: Self-pay | Admitting: Internal Medicine

## 2020-03-31 ENCOUNTER — Other Ambulatory Visit: Payer: Self-pay | Admitting: Internal Medicine

## 2020-03-31 DIAGNOSIS — T502X5A Adverse effect of carbonic-anhydrase inhibitors, benzothiadiazides and other diuretics, initial encounter: Secondary | ICD-10-CM

## 2020-03-31 DIAGNOSIS — E785 Hyperlipidemia, unspecified: Secondary | ICD-10-CM

## 2020-04-04 ENCOUNTER — Other Ambulatory Visit: Payer: Self-pay | Admitting: Internal Medicine

## 2020-04-06 NOTE — Addendum Note (Signed)
Addended by: Aviva Signs M on: 04/06/2020 10:36 AM   Modules accepted: Orders

## 2020-04-07 NOTE — Chronic Care Management (AMB) (Signed)
Chronic Care Management Pharmacy  Name: SUHAIL DEANDA  MRN: [id number] DOB: 07/21/1941  Chief Complaint/ HPI  Jacqeline Laurence Aly,  79 y.o. , female presents for their Initial CCM visit with the clinical pharmacist via telephone due to COVID-19 Pandemic.  PCP : Janith Lima, MD  Their chronic conditions include: HTN,T2DM, HLD, Alzheimer's, osteoarthritis  Spoke with caregiver [name] and son [name]. [name] is with patient daily and helps with administering medications from pill box. Son visits once a week to help as well.   Office Visits: 01/28/20 Dr Ronnald Ramp OV: DM controlled, meds not indicated. New venous stasis dermatitis, recommended Vasculera.  01/18/20 Dr Quay Burow OV: bilateral LE edema - wt gain and increased salt. Diet/weight loss difficult due to dementia, elevate legs also unlikely. Gave Lasix 40 mg x 1 then return to 20 mg daily.  Consult Visit: 11/30/19 NP Amy Ubaldo Glassing (neurology): recommend assistance with medication assistance and securing medications so she cannot change pill box.  No Known Allergies  Medications: Outpatient Encounter Medications as of 04/08/2020  Medication Sig  . amLODipine (NORVASC) 5 MG tablet Take 1 tablet (5 mg total) by mouth daily.  . cyanocobalamin 500 MCG tablet Take 500 mcg by mouth daily.  Marland Kitchen donepezil (ARICEPT) 10 MG tablet Take 1 tablet (10 mg total) by mouth daily.  . furosemide (LASIX) 20 MG tablet TAKE 1 TABLET BY MOUTH EVERY DAY  . KLOR-CON M20 20 MEQ tablet TAKE 1 TABLET BY MOUTH EVERY DAY  . memantine (NAMENDA) 10 MG tablet Take 1 tablet (10 mg total) by mouth 2 (two) times daily.  . metoprolol tartrate (LOPRESSOR) 100 MG tablet TAKE 1 TABLET BY MOUTH TWICE A DAY  . rosuvastatin (CRESTOR) 20 MG tablet TAKE 1 TABLET BY MOUTH EVERY DAY  . Dietary Management Product (VASCULERA) TABS Take 1 capsule by mouth daily. (Patient not taking: Reported on 04/08/2020)  . [DISCONTINUED] amLODipine (NORVASC) 5 MG tablet Take 1 tablet (5 mg total) by mouth  daily. (Patient not taking: Reported on 11/30/2019)  . [DISCONTINUED] metoprolol tartrate (LOPRESSOR) 100 MG tablet TAKE 1 TABLET BY MOUTH 2 TIMES DAILY   No facility-administered encounter medications on file as of 04/08/2020.     Current Diagnosis/Assessment:  SDOH Interventions     Most Recent Value  SDOH Interventions  SDOH Interventions for the Following Domains  Financial Strain  Financial Strain Interventions  Other (Comment) [no financial barriers identified]     Goals Addressed            This Visit's Progress   . Pharmacy Care Plan       CARE PLAN ENTRY  Current Barriers:  . Chronic Disease Management support, education, and care coordination needs related to Hypertension, Hyperlipidemia, Diabetes, and Alzheimer's Dementia   Hypertension . Pharmacist Clinical Goal(s): o Over the next 90 days, patient will work with PharmD and providers to maintain BP goal <140/90 . Current regimen:  o Amlodipine 5 mg daily o Metoprolol tartrate 100 mg BID o Furosemide 20 mg daily o Potassium chloride 20 mEq daily . Interventions: o Discussed benefits of maintaining BP at goal, including prevention of heart attack and stroke o Discussed to avoid crushing Klor-Con if possible; caregivers agreed switching to powder or liquid would not help . Patient self care activities - Over the next 90 days, patient will: o Check BP 1-2 times weekly, document, and provide at future appointments o Ensure daily salt intake < 2300 mg/day  Hyperlipidemia . Pharmacist Clinical Goal(s): o Over the next  90 days, patient will work with PharmD and providers to maintain LDL goal < 100 . Current regimen:  o Rosuvastatin 20 mg daily . Interventions: o Discussed cholesterol goals and benefits of statin for prevention of heart attack and stroke . Patient self care activities - Over the next 90 days, patient will: o Continue medication as prescribed o Continue low cholesterol diet (see  attached)  Alzheimer's Dementia . Pharmacist Clinical Goal(s) o Over the next 90 days, patient will work with PharmD and providers to slow progression of dementia . Current regimen:  o Memantine 10 mg BID o Donepezil 10 mg daily . Interventions: o Discussed benefits of medications for slowing progression of dementia . Patient self care activities - Over the next 90 days, patient will: o Continue medications as prescribed  Medication management . Pharmacist Clinical Goal(s): o Over the next 90 days, patient will work with PharmD and providers to achieve optimal medication adherence . Current [facility]: [facility] . Interventions o Comprehensive medication review performed. o Utilize [facility] for medication synchronization, packaging and delivery . Patient self care activities - Over the next 90 days, patient will: o Focus on medication adherence by pill pack o Take medications as prescribed o Report any questions or concerns to PharmD and/or provider(s)  Initial goal documentation       Hypertension   BP goal < 140/90  Office blood pressures are  BP Readings from Last 3 Encounters:  01/28/20 140/70  01/18/20 (!) 148/72  11/30/19 122/71   Patient has failed these meds in the past: n/a Patient is currently controlled on the following medications:   Amlodipine 5 mg daily  Metoprolol tartrate 100 mg BID  Furosemide 20 mg daily  Potassium chloride 20 mEq daily  Patient checks BP at home: never  Patient home BP readings are ranging: n/a  We discussed diet and exercise extensively; counseled on BP goal and checking BP at home. Caregivers report furosemide is helping significantly with LE edema. CG reports she has trouble swallowing potassium so she has been crushing it. Suggested switching to powder or liquid, but CG report those would be even harder to administer. CG denies side effects or GI distress, may continue current method.  Plan  Continue current  medications     Hyperlipidemia   Lipid Panel     Component Value Date/Time   CHOL 187 04/29/2019 1148   TRIG 95.0 04/29/2019 1148   HDL 44.00 04/29/2019 1148   CHOLHDL 4 04/29/2019 1148   VLDL 19.0 04/29/2019 1148   LDLCALC 124 (H) 04/29/2019 1148    The 10-year ASCVD risk score Mikey Bussing DC Jr., et al., 2013) is: 35.6%   Values used to calculate the score:     Age: 25 years     Sex: Female     Is Non-Hispanic African American: Yes     Diabetic: Yes     Tobacco smoker: No     Systolic Blood Pressure: [id number] mmHg     Is BP treated: Yes     HDL Cholesterol: 44 mg/dL     Total Cholesterol: 187 mg/dL   Patient has failed these meds in past: n/a Patient is currently uncontrolled on the following medications:   Rosuvastatin 20 mg daily  We discussed:  diet and exercise extensively; cholesterol goals; benefits of statin.  Plan  Continue current medications and control with diet and exercise  Reduce cholesterol content in diet  Diabetes   A1c goal < 8%  Recent Relevant Labs: Lab Results  Component Value Date/Time   HGBA1C 7.3 (H) 01/28/2020 11:29 AM   HGBA1C 7.2 (H) 08/26/2019 01:36 PM   GFR 59.17 (L) 01/28/2020 11:29 AM   GFR 59.23 (L) 08/26/2019 01:36 PM   MICROALBUR <0.7 08/26/2019 01:36 PM    No medication indicated.  We discussed: diet and exercise extensively  Plan  Continue control with diet and exercise   Alzheimer's   Patient has failed these meds in past: n/a Patient is currently controlled on the following medications:   Memantine 10 mg BID  Donepezil 10 mg daily   We discussed:  Efficacy is based on slowing progression of disease. Currently denies side effects or issues with meds.  Plan  Continue current medications    Health Maintenance   Patient is currently controlled on the following medications:   Vitamin B12 500 mcg daily  Vitamin D 1000 IU daily  We discussed:  Patient is satisfied with current OTC regimen and denies  issues  Plan  Continue current medications   Medication Management   [facility] Uses pill box? Yes  Pt endorses 100% compliance  We discussed: Caregiver Edna administers medications for patient - sets up pill box each week and places meds in front of patient to take.  Verbal consent obtained for [facility] enhanced pharmacy services (medication synchronization, adherence packaging, delivery coordination). A medication sync plan was created to allow patient to get all medications delivered once every 30 to 90 days per patient preference. Patient understands they have freedom to choose pharmacy and clinical pharmacist will coordinate care between all prescribers and [facility].  Delivery address: [address] [place] [address]   Plan  Utilize [facility] pharmacy for medication synchronization, packaging and delivery   Follow up: 3 month phone visit  Charlene Brooke, PharmD Clinical Pharmacist [facility] at [facility] [phone]

## 2020-04-08 ENCOUNTER — Other Ambulatory Visit: Payer: Self-pay

## 2020-04-08 ENCOUNTER — Ambulatory Visit: Payer: Medicare Other | Admitting: Pharmacist

## 2020-04-08 DIAGNOSIS — I10 Essential (primary) hypertension: Secondary | ICD-10-CM

## 2020-04-08 DIAGNOSIS — F0280 Dementia in other diseases classified elsewhere without behavioral disturbance: Secondary | ICD-10-CM

## 2020-04-08 DIAGNOSIS — E785 Hyperlipidemia, unspecified: Secondary | ICD-10-CM

## 2020-04-08 NOTE — Patient Instructions (Addendum)
Visit Information  Thank you for meeting with me to discuss your medications! I look forward to working with you to achieve your health care goals. Below is a summary of what we talked about during the visit:  Goals Addressed            This Visit's Progress   . Pharmacy Care Plan       CARE PLAN ENTRY  Current Barriers:  . Chronic Disease Management support, education, and care coordination needs related to Hypertension, Hyperlipidemia, Diabetes, and Alzheimer's Dementia   Hypertension . Pharmacist Clinical Goal(s): o Over the next 90 days, patient will work with PharmD and providers to maintain BP goal <140/90 . Current regimen:  o Amlodipine 5 mg daily o Metoprolol tartrate 100 mg BID o Furosemide 20 mg daily o Potassium chloride 20 mEq daily . Interventions: o Discussed benefits of maintaining BP at goal, including prevention of heart attack and stroke o Discussed to avoid crushing Klor-Con if possible; caregivers agreed switching to powder or liquid would not help . Patient self care activities - Over the next 90 days, patient will: o Check BP 1-2 times weekly, document, and provide at future appointments o Ensure daily salt intake < 2300 mg/day  Hyperlipidemia . Pharmacist Clinical Goal(s): o Over the next 90 days, patient will work with PharmD and providers to maintain LDL goal < 100 . Current regimen:  o Rosuvastatin 20 mg daily . Interventions: o Discussed cholesterol goals and benefits of statin for prevention of heart attack and stroke . Patient self care activities - Over the next 90 days, patient will: o Continue medication as prescribed o Continue low cholesterol diet (see attached)  Alzheimer's Dementia . Pharmacist Clinical Goal(s) o Over the next 90 days, patient will work with PharmD and providers to slow progression of dementia . Current regimen:  o Memantine 10 mg BID o Donepezil 10 mg daily . Interventions: o Discussed benefits of medications for  slowing progression of dementia . Patient self care activities - Over the next 90 days, patient will: o Continue medications as prescribed  Medication management . Pharmacist Clinical Goal(s): o Over the next 90 days, patient will work with PharmD and providers to achieve optimal medication adherence . [facility] . Interventions o Comprehensive medication review performed. o Utilize [facility] for medication synchronization, packaging and delivery . Patient self care activities - Over the next 90 days, patient will: o Focus on medication adherence by pill pack o Take medications as prescribed o Report any questions or concerns to PharmD and/or provider(s)  Initial goal documentation       Ms. Dedman was given information about Chronic Care Management services today including:  1. CCM service includes personalized support from designated clinical staff supervised by her physician, including individualized plan of care and coordination with other care providers 2. 24/7 contact phone numbers for assistance for urgent and routine care needs. 3. Standard insurance, coinsurance, copays and deductibles apply for chronic care management only during months in which we provide at least 20 minutes of these services. Most insurances cover these services at 100%, however patients may be responsible for any copay, coinsurance and/or deductible if applicable. This service may help you avoid the need for more expensive face-to-face services. 4. Only one practitioner may furnish and bill the service in a calendar month. 5. The patient may stop CCM services at any time (effective at the end of the month) by phone call to the office staff.  Patient agreed  to services and verbal consent obtained.   The patient verbalized understanding of instructions provided today and declined a print copy of patient instruction materials.  Telephone follow up appointment with pharmacy team member  scheduled for: 3 months  Charlene Brooke, PharmD Clinical Pharmacist [address] [facility] at [facility] [phone]  Cholesterol Content in Foods Cholesterol is a waxy, fat-like substance that helps to carry fat in the blood. The body needs cholesterol in small amounts, but too much cholesterol can cause damage to the arteries and heart. Most people should eat less than 200 milligrams (mg) of cholesterol a day. Foods with cholesterol  Cholesterol is found in animal-based foods, such as meat, seafood, and dairy. Generally, low-fat dairy and lean meats have less cholesterol than full-fat dairy and fatty meats. The milligrams of cholesterol per serving (mg per serving) of common cholesterol-containing foods are listed below. Meat and other proteins  Egg -- one large whole egg has 186 mg.  Veal shank -- 4 oz has 141 mg.  Lean ground [place] (93% lean) -- 4 oz has 118 mg.  Fat-trimmed lamb loin -- 4 oz has 106 mg.  Lean ground beef (90% lean) -- 4 oz has 100 mg.  Lobster -- 3.5 oz has 90 mg.  Pork loin chops -- 4 oz has 86 mg.  Canned salmon -- 3.5 oz has 83 mg.  Fat-trimmed beef top loin -- 4 oz has 78 mg.  Frankfurter -- 1 frank (3.5 oz) has 77 mg.  Crab -- 3.5 oz has 71 mg.  Roasted chicken without skin, white meat -- 4 oz has 66 mg.  Light bologna -- 2 oz has 45 mg.  Deli-cut [place] -- 2 oz has 31 mg.  Canned tuna -- 3.5 oz has 31 mg.  Berniece Salines -- 1 oz has 29 mg.  Oysters and mussels (raw) -- 3.5 oz has 25 mg.  Mackerel -- 1 oz has 22 mg.  Trout -- 1 oz has 20 mg.  Pork sausage -- 1 link (1 oz) has 17 mg.  Salmon -- 1 oz has 16 mg.  Tilapia -- 1 oz has 14 mg. Dairy  Soft-serve ice cream --  cup (4 oz) has 103 mg.  Whole-milk yogurt -- 1 cup (8 oz) has 29 mg.  Cheddar cheese -- 1 oz has 28 mg.  American cheese -- 1 oz has 28 mg.  Whole milk -- 1 cup (8 oz) has 23 mg.  2% milk -- 1 cup (8 oz) has 18 mg.  Cream cheese -- 1 tablespoon (Tbsp) has 15  mg.  Cottage cheese --  cup (4 oz) has 14 mg.  Low-fat (1%) milk -- 1 cup (8 oz) has 10 mg.  Sour cream -- 1 Tbsp has 8.5 mg.  Low-fat yogurt -- 1 cup (8 oz) has 8 mg.  Nonfat Greek yogurt -- 1 cup (8 oz) has 7 mg.  Half-and-half cream -- 1 Tbsp has 5 mg. Fats and oils  Cod liver oil -- 1 tablespoon (Tbsp) has 82 mg.  Butter -- 1 Tbsp has 15 mg.  Lard -- 1 Tbsp has 14 mg.  Bacon grease -- 1 Tbsp has 14 mg.  Mayonnaise -- 1 Tbsp has 5-10 mg.  Margarine -- 1 Tbsp has 3-10 mg. Exact amounts of cholesterol in these foods may vary depending on specific ingredients and brands. Foods without cholesterol Most plant-based foods do not have cholesterol unless you combine them with a food that has cholesterol. Foods without cholesterol include:  Grains  and cereals.  Vegetables.  Fruits.  Vegetable oils, such as olive, canola, and sunflower oil.  Legumes, such as peas, beans, and lentils.  Nuts and seeds.  Egg whites. Summary  The body needs cholesterol in small amounts, but too much cholesterol can cause damage to the arteries and heart.  Most people should eat less than 200 milligrams (mg) of cholesterol a day. This information is not intended to replace advice given to you by your health care provider. Make sure you discuss any questions you have with your health care provider. Document Revised: 10/18/2017 Document Reviewed: 07/02/2017 Elsevier Patient Education  [place].

## 2020-05-05 ENCOUNTER — Telehealth: Payer: Self-pay

## 2020-05-05 DIAGNOSIS — E785 Hyperlipidemia, unspecified: Secondary | ICD-10-CM

## 2020-05-05 DIAGNOSIS — E876 Hypokalemia: Secondary | ICD-10-CM

## 2020-05-05 DIAGNOSIS — I10 Essential (primary) hypertension: Secondary | ICD-10-CM

## 2020-05-05 MED ORDER — METOPROLOL TARTRATE 100 MG PO TABS: 100.0000 mg | ORAL_TABLET | Freq: Two times a day (BID) | ORAL | 1 refills | Status: DC

## 2020-05-05 MED ORDER — FUROSEMIDE 20 MG PO TABS: 20.0000 mg | ORAL_TABLET | Freq: Every day | ORAL | 1 refills | Status: DC

## 2020-05-05 MED ORDER — POTASSIUM CHLORIDE CRYS ER 20 MEQ PO TBCR: 20.0000 meq | EXTENDED_RELEASE_TABLET | Freq: Every day | ORAL | 1 refills | Status: DC

## 2020-05-05 MED ORDER — AMLODIPINE BESYLATE 5 MG PO TABS: 5.0000 mg | ORAL_TABLET | Freq: Every day | ORAL | Status: DC

## 2020-05-05 MED ORDER — ROSUVASTATIN CALCIUM 20 MG PO TABS: 20.0000 mg | ORAL_TABLET | Freq: Every day | ORAL | 1 refills | Status: DC

## 2020-05-05 NOTE — Telephone Encounter (Signed)
[facility] sent to [facility] as requested.

## 2020-05-05 NOTE — Telephone Encounter (Signed)
-----   Message from [name], [facility] sent at 05/05/2020 10:11 AM EDT ----- Regarding: Med refills Patient is switching to [facility], can you order refills for 90-day supplies?  Furosemide 20 mg Metoprolol tartrate 100 mg Klor-Con 20 meq Rosuvastatin 20 mg Amlodipine 5 mg

## 2020-06-16 MED ORDER — AMLODIPINE BESYLATE 5 MG PO TABS: 5.0000 mg | ORAL_TABLET | Freq: Every day | ORAL | 0 refills | Status: DC

## 2020-06-16 NOTE — Addendum Note (Signed)
Addended by: Karle Barr on: 06/16/2020 09:36 AM   Modules accepted: Orders

## 2020-06-21 ENCOUNTER — Other Ambulatory Visit: Payer: Self-pay | Admitting: Internal Medicine

## 2020-06-21 DIAGNOSIS — I10 Essential (primary) hypertension: Secondary | ICD-10-CM

## 2020-06-22 ENCOUNTER — Telehealth: Payer: Self-pay | Admitting: Pharmacist

## 2020-06-22 ENCOUNTER — Other Ambulatory Visit: Payer: Self-pay | Admitting: Internal Medicine

## 2020-06-22 NOTE — Progress Notes (Signed)
Received call from patient's son Martha Mata regarding medication management via [facility].  Patient's son requested an acute fill for Furosemide 20 mg and Memantine 10 mg to be delivered: 06/24/20 Pharmacy needs refills? No  Confirmed delivery date of 06/24/20, advised patient that pharmacy will contact them the morning of delivery. Patient's son has requested delivery between 2:30-5pm when caregiver is at the patient's home.  [name], [facility]

## 2020-06-29 ENCOUNTER — Ambulatory Visit: Payer: Medicare Other | Admitting: Pharmacist

## 2020-06-29 ENCOUNTER — Other Ambulatory Visit: Payer: Self-pay

## 2020-06-29 DIAGNOSIS — E118 Type 2 diabetes mellitus with unspecified complications: Secondary | ICD-10-CM

## 2020-06-29 DIAGNOSIS — E785 Hyperlipidemia, unspecified: Secondary | ICD-10-CM

## 2020-06-29 DIAGNOSIS — G308 Other Alzheimer's disease: Secondary | ICD-10-CM

## 2020-06-29 DIAGNOSIS — I10 Essential (primary) hypertension: Secondary | ICD-10-CM

## 2020-06-29 NOTE — Chronic Care Management (AMB) (Signed)
Chronic Care Management Pharmacy  Name: Martha Mata  MRN: [id number] DOB: [birth year]  Chief Complaint/ HPI  Martha Mata,  79 y.o. , female presents for their Follow-Up CCM visit with the clinical pharmacist via telephone due to COVID-19 Pandemic.  PCP : Janith Lima, MD  Their chronic conditions include: HTN,T2DM, HLD, Alzheimer's, osteoarthritis  Spoke with caregiver Martha Mata and son Martha Mata. Martha Mata is with patient daily and helps with administering medications from pill box. Son visits once a week to help as well.   Office Visits: 01/28/20 Dr Ronnald Ramp OV: DM controlled, meds not indicated. New venous stasis dermatitis, recommended Vasculera.  01/18/20 Dr Quay Burow OV: bilateral LE edema - wt gain and increased salt. Diet/weight loss difficult due to dementia, elevate legs also unlikely. Gave Lasix 40 mg x 1 then return to 20 mg daily.  Consult Visit: 11/30/19 NP Amy Ubaldo Glassing (neurology): recommend assistance with medication assistance and securing medications so she cannot change pill box.  No Known Allergies  Medications: Outpatient Encounter Medications as of 06/29/2020  Medication Sig  . amLODipine (NORVASC) 5 MG tablet TAKE 1 TABLET BY MOUTH EVERY DAY  . cyanocobalamin 500 MCG tablet Take 500 mcg by mouth daily.  . Dietary Management Product (VASCULERA) TABS Take 1 capsule by mouth daily. (Patient not taking: Reported on 04/08/2020)  . donepezil (ARICEPT) 10 MG tablet Take 1 tablet (10 mg total) by mouth daily.  . furosemide (LASIX) 20 MG tablet Take 1 tablet (20 mg total) by mouth daily.  . memantine (NAMENDA) 10 MG tablet Take 1 tablet (10 mg total) by mouth 2 (two) times daily.  . metoprolol tartrate (LOPRESSOR) 100 MG tablet TAKE 1 TABLET BY MOUTH TWICE A DAY  . potassium chloride SA (KLOR-CON M20) 20 MEQ tablet Take 1 tablet (20 mEq total) by mouth daily.  . rosuvastatin (CRESTOR) 20 MG tablet Take 1 tablet (20 mg total) by mouth daily.  . [DISCONTINUED] amLODipine (NORVASC)  5 MG tablet Take 1 tablet (5 mg total) by mouth daily. (Patient not taking: Reported on 11/30/2019)  . [DISCONTINUED] metoprolol tartrate (LOPRESSOR) 100 MG tablet TAKE 1 TABLET BY MOUTH 2 TIMES DAILY   No facility-administered encounter medications on file as of 06/29/2020.     Current Diagnosis/Assessment:   Goals Addressed   None     Hypertension   BP goal < 140/90  Office blood pressures are  BP Readings from Last 3 Encounters:  01/28/20 140/70  01/18/20 (!) 148/72  11/30/19 122/71   Patient has failed these meds in the past: n/a Patient is currently controlled on the following medications:   Amlodipine 5 mg daily  Metoprolol tartrate 100 mg BID  Furosemide 20 mg daily  Potassium chloride 20 mEq daily  Patient checks BP at home: never  Patient home BP readings are ranging: n/a  We discussed: caregivers report no medication changes, pt is tolerating meds and denies dizziness, headaches.  Plan  Continue current medications     Hyperlipidemia   LDL goal < 100  Lipid Panel     Component Value Date/Time   CHOL 187 04/29/2019 1148   TRIG 95.0 04/29/2019 1148   HDL 44.00 04/29/2019 1148   CHOLHDL 4 04/29/2019 1148   VLDL 19.0 04/29/2019 1148   LDLCALC 124 (H) 04/29/2019 1148    The 10-year ASCVD risk score Mikey Bussing DC Jr., et al., 2013) is: 35.6%   Values used to calculate the score:     Age: 57 years     Sex:  Female     Is Non-Hispanic African American: Yes     Diabetic: Yes     Tobacco smoker: No     Systolic Blood Pressure: 373 mmHg     Is BP treated: Yes     HDL Cholesterol: 44 mg/dL     Total Cholesterol: 187 mg/dL   Patient has failed these meds in past: n/a Patient is currently uncontrolled on the following medications:   Rosuvastatin 20 mg daily  We discussed:  diet and exercise extensively; cholesterol goals; benefits of statin.  Plan  Continue current medications and control with diet and exercise  Reduce cholesterol content in  diet  Diabetes   A1c goal < 8%  Recent Relevant Labs: Lab Results  Component Value Date/Time   HGBA1C 7.3 (H) 01/28/2020 11:29 AM   HGBA1C 7.2 (H) 08/26/2019 01:36 PM   GFR 59.17 (L) 01/28/2020 11:29 AM   GFR 59.23 (L) 08/26/2019 01:36 PM   MICROALBUR <0.7 08/26/2019 01:36 PM    No medication indicated.  We discussed: diet and exercise extensively  Plan  Continue control with diet and exercise   Alzheimer's   Patient has failed these meds in past: n/a Patient is currently controlled on the following medications:   Memantine 10 mg BID  Donepezil 10 mg daily   We discussed:  Efficacy is based on slowing progression of disease. Currently denies side effects or issues with meds.  Plan  Continue current medications    Medication Management   Pt uses [facility] for all medications Uses pill box? Yes  Pt endorses 100% compliance  We discussed: Currently patient's caregiver Martha Mata fills pill boxes each week. Patient is currently in process of syncing meds in order to start pill packs this month.  Reviewed patient's [facility] medication and Epic medication profile assuring there are no discrepancies or gaps in therapy. Confirmed all fill dates appropriate and verified with patient that there is a sufficient quantity of all prescribed medications at home. Informed patient to call me any time if needing medications before scheduled deliveries. The anticipated medication sync date is 07/08/20.  Plan  Utilize [facility] pharmacy for medication synchronization, packaging and delivery   Follow up: 6 month phone visit  Charlene Brooke, PharmD Clinical Pharmacist [facility] [facility] at [facility] [phone]

## 2020-06-29 NOTE — Patient Instructions (Addendum)
Visit Information  Phone number for Pharmacist: [phone]  Goals Addressed            This Visit's Progress   . Pharmacy Care Plan       CARE PLAN ENTRY  Current Barriers:  . Chronic Disease Management support, education, and care coordination needs related to Hypertension, Hyperlipidemia, Diabetes, and Alzheimer's Dementia   Hypertension BP Readings from Last 3 Encounters:  01/28/20 140/70  01/18/20 (!) 148/72  11/30/19 122/71 .  Pharmacist Clinical Goal(s): o Over the next 180 days, patient will work with PharmD and providers to maintain BP goal <140/90 . Current regimen:  o Amlodipine 5 mg daily o Metoprolol tartrate 100 mg BID o Furosemide 20 mg daily o Potassium chloride 20 mEq daily . Interventions: o Discussed benefits of maintaining BP at goal, including prevention of heart attack and stroke o Discussed to avoid crushing Klor-Con if possible; caregivers agreed switching to powder or liquid would not help . Patient self care activities - Over the next 180 days, patient will: o Check BP 1-2 times weekly, document, and provide at future appointments o Ensure daily salt intake < 2300 mg/day  Hyperlipidemia Lab Results  Component Value Date/Time   LDLCALC 124 (H) 04/29/2019 11:48 AM .  Pharmacist Clinical Goal(s): o Over the next 180 days, patient will work with PharmD and providers to maintain LDL goal < 100 . Current regimen:  o Rosuvastatin 20 mg daily . Interventions: o Discussed cholesterol goals and benefits of statin for prevention of heart attack and stroke . Patient self care activities - Over the next 180 days, patient will: o Continue medication as prescribed o Continue low cholesterol diet   Alzheimer's Dementia . Pharmacist Clinical Goal(s) o Over the next 180 days, patient will work with PharmD and providers to slow progression of dementia . Current regimen:  o Memantine 10 mg BID o Donepezil 10 mg daily . Interventions: o Discussed benefits  of medications for slowing progression of dementia . Patient self care activities - Over the next 180 days, patient will: o Continue medications as prescribed  Medication management . Pharmacist Clinical Goal(s): o Over the next 180 days, patient will work with PharmD and providers to achieve optimal medication adherence . Current pharmacy: [facility] . Interventions o Comprehensive medication review performed. o Utilize [facility] pharmacy for medication synchronization, packaging and delivery . Patient self care activities - Over the next 180 days, patient will: o Focus on medication adherence by pill pack o Take medications as prescribed o Report any questions or concerns to PharmD and/or provider(s)  Please see past updates related to this goal by clicking on the "Past Updates" button in the selected goal       Patient verbalizes understanding of instructions provided today.   Telephone follow up appointment with pharmacy team member scheduled for: 6 months  Charlene Brooke, PharmD Clinical Pharmacist [facility] [phone]  Living With Alzheimer Disease Alzheimer disease is a brain disease that makes you forget things that you used to know. It also makes it hard to pay attention, communicate, and do routine tasks. Alzheimer disease gets worse over time. At the start of the disease, you may be able to take care of yourself, but you will eventually need someone to help care for you. How to cope with changes It is normal to have many emotions about this condition, such as fear, sadness, anger, and loss. Here are some ways to help yourself cope with these emotions:  Accept your emotions.  Write down your thoughts and feelings in a journal.  Build a supportive group of friends and family to help you.  Join a support group for people with Alzheimer disease. The changes caused by Alzheimer disease can be frightening and confusing. Here are some things  you can do to make adjusting to these changes a little easier.  Keep a daily routine.  Keep a calendar in a central location. Write down your appointments and activities on the calendar.  Keep a list of things you need to do.  Focus on one task at a time.  Organize medicines in a pillbox for each day of the week.  Accept that some things may need to change for your safety, such as driving.  Accept help from others. Do not be ashamed if you need help with certain tasks.  Think about what is most stressful for you. Then find ways to change or avoid these things if possible. Ask for help from others to help relieve stress.  Create a plan for any legal or financial actions that are needed. Get professional advice if you are not sure what should be done. How to recognize stress It is normal to feel stressed from time to time. Here are some signs that you are feeling stressed:  Avoiding contact with other people.  Anger or frustration.  Denying that you have the disease.  Trouble sleeping.  Trouble concentrating.  Irritability.  Anxiety.  Depression.  Developing other health problems. Follow these instructions at home:  Create a bedtime and sleeping routine that includes: ? Sleeping in a cool bedroom. ? Using darkening shades. ? No physical activity or eating for a few hours before bedtime.  Get regular exercise.  Use safety devices, such as a cane or walker, if you have trouble balancing.  Have a safety plan for emergencies. Consider using a safety alert system that allows you to get help quickly.  Take steps to manage your stress. Try any of the following: ? Listen to music. ? Spend time with others. ? Talk about how you are feeling. ? Do creative artwork. ? Do meditation and deep breathing exercises.  Avoid caffeine and alcohol.  Take over-the-counter and prescription medicines only as told by your health care provider. Where to find support  Friends and  family.  Your place of worship.  Counselors or therapists.  Support groups.  Home health care services. Where to find more information Alzheimer's Association: [employer] Contact a health care provider if:  You are unable to care for yourself.  You feel that you are in danger. Get help right away if:  You have feelings of harming yourself.  You feel depressed. If you ever feel like you may hurt yourself or others, or have thoughts about taking your own life, get help right away. You can go to your nearest emergency department or call:  Your local emergency services (911 in the [place]  A suicide crisis helpline, such as the [facility] at 509-274-4263. This is open 24 hours a day. Summary  Alzheimer disease is a brain disease that makes you forget things that you used to know. It also makes it hard to pay attention, communicate, and do routine tasks.  Alzheimer disease gets worse over time. At the start of the disease, you may be able to take care of yourself, but you will eventually need someone to help care for you.  The changes caused by Alzheimer disease can be frightening and  confusing. You may need to make changes in your daily routine. Build a supportive group of friends and family to help you. This information is not intended to replace advice given to you by your health care provider. Make sure you discuss any questions you have with your health care provider. Document Revised: 02/27/2019 Document Reviewed: 10/25/2016 Elsevier Patient Education  [place].

## 2020-07-04 ENCOUNTER — Telehealth: Payer: Self-pay | Admitting: Pharmacist

## 2020-07-04 NOTE — Progress Notes (Addendum)
Chronic Care Management Pharmacy Assistant   Name: Martha Mata  MRN: [id number] DOB: 27-Jun-1941  Reason for Encounter: Medication Review  Patient Questions:  1.  Have you seen any other providers since your last visit? No  2.  Any changes in your medicines or health? No    PCP : Janith Lima, MD  Allergies:  No Known Allergies  Medications: Outpatient Encounter Medications as of 07/04/2020  Medication Sig  . amLODipine (NORVASC) 5 MG tablet TAKE 1 TABLET BY MOUTH EVERY DAY  . cyanocobalamin 500 MCG tablet Take 500 mcg by mouth daily.  . Dietary Management Product (VASCULERA) TABS Take 1 capsule by mouth daily. (Patient not taking: Reported on 04/08/2020)  . donepezil (ARICEPT) 10 MG tablet Take 1 tablet (10 mg total) by mouth daily.  . furosemide (LASIX) 20 MG tablet Take 1 tablet (20 mg total) by mouth daily.  . memantine (NAMENDA) 10 MG tablet Take 1 tablet (10 mg total) by mouth 2 (two) times daily.  . metoprolol tartrate (LOPRESSOR) 100 MG tablet TAKE 1 TABLET BY MOUTH TWICE A DAY  . potassium chloride SA (KLOR-CON M20) 20 MEQ tablet Take 1 tablet (20 mEq total) by mouth daily.  . rosuvastatin (CRESTOR) 20 MG tablet Take 1 tablet (20 mg total) by mouth daily.  . [DISCONTINUED] amLODipine (NORVASC) 5 MG tablet Take 1 tablet (5 mg total) by mouth daily. (Patient not taking: Reported on 11/30/2019)  . [DISCONTINUED] metoprolol tartrate (LOPRESSOR) 100 MG tablet TAKE 1 TABLET BY MOUTH 2 TIMES DAILY   No facility-administered encounter medications on file as of 07/04/2020.    Current Diagnosis: Patient Active Problem List   Diagnosis Date Noted  . Venous stasis dermatitis of both lower extremities 01/28/2020  . Bilateral edema of lower extremity 01/18/2020  . Type II diabetes mellitus with manifestations ([place]) 08/26/2019  . Routine general medical examination at a health care facility 04/27/2019  . Hyperlipidemia LDL goal <130 04/27/2019  . Post-traumatic  osteoarthritis of left knee 04/27/2019  . Hypertension 04/25/2018  . Alzheimer's disease ([place]) 05/17/2016    Goals Addressed   None     Follow-Up:  Coordination of Enhanced Pharmacy Services      Reviewed chart for medication changes ahead of medication coordination call.  No OVs, Consults, or hospital visits since last care coordination call/Pharmacist visit.  No medication changes indicated.  BP Readings from Last 3 Encounters:  01/28/20 140/70  01/18/20 (!) 148/72  11/30/19 122/71    Lab Results  Component Value Date   HGBA1C 7.3 (H) 01/28/2020     Patient obtains medications through Adherence Packaging  90 Days   Last adherence delivery included:  Donepezil 10mg  daily- Bedtime  Amlodipine 5mg  daily-Breakfast  Memantine 10mg  twice daily- Breakfast, Bedtime  Furosemide 20mg  daily- Breakfast  Metoprolol Tartrate 100mg  twice daily- Breakfast , Bedtime  Rosuvastatin 20mg  daily- Breakfast  Potassium Chloride Er 47meq daily-Breakfast   Patient is due for next adherence delivery on: 07/12/2020. Called patient and reviewed medications and coordinated delivery.  This delivery to include:  Donepezil 10mg  daily- Bedtime  Amlodipine 5mg  daily-Breakfast  Memantine 10mg  twice daily- Breakfast, Bedtime  Furosemide 20mg  daily- Breakfast  Metoprolol Tartrate 100mg  twice daily- Breakfast , Bedtime  Rosuvastatin 20mg  daily- Breakfast  Potassium Chloride Er 87meq daily-Breakfast    Patient needs refills for Furosemide 20mg , Amlodipine 5mg , Potassium 63meq   Confirmed delivery date of 07/12/2020, advised patient that pharmacy will contact them the morning of delivery.    Deven  Clotilde Dieter, [place] Pharmacist Assistant  [phone]

## 2020-07-08 ENCOUNTER — Telehealth: Payer: Self-pay

## 2020-07-08 DIAGNOSIS — I10 Essential (primary) hypertension: Secondary | ICD-10-CM

## 2020-07-08 DIAGNOSIS — T502X5A Adverse effect of carbonic-anhydrase inhibitors, benzothiadiazides and other diuretics, initial encounter: Secondary | ICD-10-CM

## 2020-07-08 DIAGNOSIS — E876 Hypokalemia: Secondary | ICD-10-CM

## 2020-07-08 MED ORDER — FUROSEMIDE 20 MG PO TABS: 20.0000 mg | ORAL_TABLET | Freq: Every day | ORAL | 1 refills | Status: DC

## 2020-07-08 MED ORDER — POTASSIUM CHLORIDE CRYS ER 20 MEQ PO TBCR: 20.0000 meq | EXTENDED_RELEASE_TABLET | Freq: Every day | ORAL | 1 refills | Status: DC

## 2020-07-08 MED ORDER — AMLODIPINE BESYLATE 5 MG PO TABS: 5.0000 mg | ORAL_TABLET | Freq: Every day | ORAL | 1 refills | Status: DC

## 2020-07-08 NOTE — Telephone Encounter (Signed)
[place], [place], [place]  Cairrikier Griffith, [place], [place] [facility] is starting pill packs and needs 90 day supply rx for the following, can you order the refills?   Amlodipine 5 mg  Furosemide 20 mg  Potassium 20 mEq     Erx sent as requested.

## 2020-08-29 ENCOUNTER — Telehealth: Payer: Self-pay | Admitting: Pharmacist

## 2020-08-29 NOTE — Progress Notes (Signed)
Chronic Care Management Pharmacy Assistant   Name: Martha Mata  MRN: [id number] DOB: Aug 19, 1941  Reason for Encounter: Medication Review  Patient Questions:  1.  Have you seen any other providers since your last visit? No  2.  Any changes in your medicines or health? No   PCP : Janith Lima, MD  Allergies:  No Known Allergies  Medications: Outpatient Encounter Medications as of 08/29/2020  Medication Sig  . amLODipine (NORVASC) 5 MG tablet Take 1 tablet (5 mg total) by mouth daily.  . cyanocobalamin 500 MCG tablet Take 500 mcg by mouth daily.  . Dietary Management Product (VASCULERA) TABS Take 1 capsule by mouth daily. (Patient not taking: Reported on 04/08/2020)  . donepezil (ARICEPT) 10 MG tablet Take 1 tablet (10 mg total) by mouth daily.  . furosemide (LASIX) 20 MG tablet Take 1 tablet (20 mg total) by mouth daily.  . memantine (NAMENDA) 10 MG tablet Take 1 tablet (10 mg total) by mouth 2 (two) times daily.  . metoprolol tartrate (LOPRESSOR) 100 MG tablet TAKE 1 TABLET BY MOUTH TWICE A DAY  . potassium chloride SA (KLOR-CON M20) 20 MEQ tablet Take 1 tablet (20 mEq total) by mouth daily.  . rosuvastatin (CRESTOR) 20 MG tablet Take 1 tablet (20 mg total) by mouth daily.  . [DISCONTINUED] amLODipine (NORVASC) 5 MG tablet Take 1 tablet (5 mg total) by mouth daily. (Patient not taking: Reported on 11/30/2019)  . [DISCONTINUED] metoprolol tartrate (LOPRESSOR) 100 MG tablet TAKE 1 TABLET BY MOUTH 2 TIMES DAILY   No facility-administered encounter medications on file as of 08/29/2020.    Current Diagnosis: Patient Active Problem List   Diagnosis Date Noted  . Venous stasis dermatitis of both lower extremities 01/28/2020  . Bilateral edema of lower extremity 01/18/2020  . Type II diabetes mellitus with manifestations ([place]) 08/26/2019  . Routine general medical examination at a health care facility 04/27/2019  . Hyperlipidemia LDL goal <130 04/27/2019  . Post-traumatic  osteoarthritis of left knee 04/27/2019  . Hypertension 04/25/2018  . Alzheimer's disease ([place]) 05/17/2016    Goals Addressed   None     Follow-Up:  Pharmacist Review     Reviewed chart for medication changes ahead of medication coordination call.  No OVs, Consults, or hospital visits since last care coordination call/Pharmacist visit. (If appropriate, list visit date, provider name)  No medication changes indicated OR if recent visit, treatment plan here.  BP Readings from Last 3 Encounters:  01/28/20 140/70  01/18/20 (!) 148/72  11/30/19 122/71    Lab Results  Component Value Date   HGBA1C 7.3 (H) 01/28/2020     Patient obtains medications through Adherence Packaging  90 Days   Last adherence delivery included:  Memantine 10 mg; take 1 tab in the morning and bedtime Furosemide 20 mg; 1 tab daily Potassium Cl Er 20 meq; take 1 tab daily Amlodipine 5 mg; take 1 tab daily Donepezil 10 mg; take 1 tab at bedtime Metoprolol tar 100 mg; take bid morning and bedtime crestor 20 mg; take 1 tab daily  Patient declined   Memantine 10 mg; take 1 tab in the morning and bedtime Furosemide 20 mg; 1 tab daily Potassium Cl Er 20 meq; take 1 tab daily Amlodipine 5 mg; take 1 tab daily Donepezil 10 mg; take 1 tab at bedtime Metoprolol tar 100 mg; take bid morning and bedtime crestor 20 mg; take 1 tab daily   Patient received 90 day delivery on 07/08/20 and  supply end on 10/10/20 Patient is due for next adherence delivery on:10/06/20. Called patient and reviewed medications and coordinated delivery.   Rosendo Gros, [facility]  Practice Team Manager/ CPA (Clinical Pharmacist Assistant) [phone]

## 2020-09-26 ENCOUNTER — Other Ambulatory Visit: Payer: Self-pay | Admitting: Internal Medicine

## 2020-09-29 ENCOUNTER — Telehealth: Payer: Self-pay | Admitting: Pharmacist

## 2020-09-29 NOTE — Progress Notes (Signed)
Chronic Care Management Pharmacy Assistant   Name: Martha Mata  MRN: [id number] DOB: July 27, 1941  Reason for Encounter: Medication Review  Patient Questions:  1.  Have you seen any other providers since your last visit? No  2.  Any changes in your medicines or health? No   PCP : Janith Lima, MD  Allergies:  No Known Allergies  Medications: Outpatient Encounter Medications as of 09/29/2020  Medication Sig  . amLODipine (NORVASC) 5 MG tablet Take 1 tablet (5 mg total) by mouth daily.  . cyanocobalamin 500 MCG tablet Take 500 mcg by mouth daily.  . Dietary Management Product (VASCULERA) TABS Take 1 capsule by mouth daily. (Patient not taking: Reported on 04/08/2020)  . donepezil (ARICEPT) 10 MG tablet Take 1 tablet (10 mg total) by mouth daily.  . furosemide (LASIX) 20 MG tablet Take 1 tablet (20 mg total) by mouth daily.  . memantine (NAMENDA) 10 MG tablet Take 1 tablet (10 mg total) by mouth 2 (two) times daily.  . metoprolol tartrate (LOPRESSOR) 100 MG tablet TAKE 1 TABLET BY MOUTH TWICE A DAY  . potassium chloride SA (KLOR-CON M20) 20 MEQ tablet Take 1 tablet (20 mEq total) by mouth daily.  . rosuvastatin (CRESTOR) 20 MG tablet Take 1 tablet (20 mg total) by mouth daily.  . [DISCONTINUED] amLODipine (NORVASC) 5 MG tablet Take 1 tablet (5 mg total) by mouth daily. (Patient not taking: Reported on 11/30/2019)  . [DISCONTINUED] metoprolol tartrate (LOPRESSOR) 100 MG tablet TAKE 1 TABLET BY MOUTH 2 TIMES DAILY   No facility-administered encounter medications on file as of 09/29/2020.    Current Diagnosis: Patient Active Problem List   Diagnosis Date Noted  . Venous stasis dermatitis of both lower extremities 01/28/2020  . Bilateral edema of lower extremity 01/18/2020  . Type II diabetes mellitus with manifestations ([place]) 08/26/2019  . Routine general medical examination at a health care facility 04/27/2019  . Hyperlipidemia LDL goal <130 04/27/2019  . Post-traumatic  osteoarthritis of left knee 04/27/2019  . Hypertension 04/25/2018  . Alzheimer's disease ([place]) 05/17/2016    Goals Addressed   None     Follow-Up:  Coordination of Enhanced Pharmacy Services   Reviewed chart for medication changes ahead of medication coordination call.  No OVs, Consults, or hospital visits since last care coordination call/Pharmacist visit. No medication changes indicated  BP Readings from Last 3 Encounters:  01/28/20 140/70  01/18/20 (!) 148/72  11/30/19 122/71    Lab Results  Component Value Date   HGBA1C 7.3 (H) 01/28/2020     Patient obtains medications through Adherence Packaging  90 Days   Last adherence delivery included:   Memantine 10 mg 1 tab breakfast and bedtime Furosemide 20 mg 1 tab breakfast Potassium Cl Er 20 meq 1 tab breakfast Amlodipine 5 mg 1 tab breakfast Donepezil 10 mg 1 tab bedtime Metoprolol tartrate 100 mg 1 tab breakfast and bedtime Crestor 20 mg 1 tab daily   Patient is due for next adherence delivery on: 10/06/2020. Called patient and reviewed medications and coordinated delivery.  This delivery to include:  Memantine 10 mg 1 tab breakfast and bedtime Furosemide 20 mg 1 tab breakfast Potassium Cl Er 20 meq 1 tab breakfast Amlodipine 5 mg 1 tab breakfast Donepezil 10 mg 1 tab bedtime Metoprolol tartrate 100 mg 1 tab breakfast and bedtime Crestor 20 mg 1 tab daily   Patient needs refills for Donepezil 10 mg prescribed by Amy Lomax  Confirmed delivery date of 10/06/2020,  advised patient that pharmacy will contact them the morning of delivery.   Wendy Poet, Clinical Pharmacist Assistant [facility]

## 2020-10-18 NOTE — Telephone Encounter (Signed)
Reviewed chart and insurance data for medication adherence. Patient does not have any gaps in adherence and is not in danger of failing any Medicare adherence measures. No further action required. ° °

## 2020-10-27 ENCOUNTER — Telehealth: Payer: Self-pay | Admitting: Pharmacist

## 2020-10-27 NOTE — Progress Notes (Addendum)
    Chronic Care Management Pharmacy Assistant   Name: Martha Mata  MRN: [id number] DOB: 07-Sep-1941  Reason for Encounter: Medication Review  Patient Questions:  1.  Have you seen any other providers since your last visit? No  2.  Any changes in your medicines or health? No    PCP : Janith Lima, MD  Allergies:  No Known Allergies  Medications: Outpatient Encounter Medications as of 10/27/2020  Medication Sig   amLODipine (NORVASC) 5 MG tablet Take 1 tablet (5 mg total) by mouth daily.   cyanocobalamin 500 MCG tablet Take 500 mcg by mouth daily.   Dietary Management Product (VASCULERA) TABS Take 1 capsule by mouth daily. (Patient not taking: Reported on 04/08/2020)   donepezil (ARICEPT) 10 MG tablet Take 1 tablet (10 mg total) by mouth daily.   furosemide (LASIX) 20 MG tablet Take 1 tablet (20 mg total) by mouth daily.   memantine (NAMENDA) 10 MG tablet Take 1 tablet (10 mg total) by mouth 2 (two) times daily.   metoprolol tartrate (LOPRESSOR) 100 MG tablet TAKE 1 TABLET BY MOUTH TWICE A DAY   potassium chloride SA (KLOR-CON M20) 20 MEQ tablet Take 1 tablet (20 mEq total) by mouth daily.   rosuvastatin (CRESTOR) 20 MG tablet Take 1 tablet (20 mg total) by mouth daily.   [DISCONTINUED] amLODipine (NORVASC) 5 MG tablet Take 1 tablet (5 mg total) by mouth daily. (Patient not taking: Reported on 11/30/2019)   [DISCONTINUED] metoprolol tartrate (LOPRESSOR) 100 MG tablet TAKE 1 TABLET BY MOUTH 2 TIMES DAILY   No facility-administered encounter medications on file as of 10/27/2020.    Current Diagnosis: Patient Active Problem List   Diagnosis Date Noted   Venous stasis dermatitis of both lower extremities 01/28/2020   Bilateral edema of lower extremity 01/18/2020   Type II diabetes mellitus with manifestations ([place]) 08/26/2019   Routine general medical examination at a health care facility 04/27/2019   Hyperlipidemia LDL goal <130 04/27/2019   Post-traumatic osteoarthritis of  left knee 04/27/2019   Hypertension 04/25/2018   Alzheimer's disease ([place]) 05/17/2016    Goals Addressed   None     Follow-Up:  Coordination of Enhanced Pharmacy Services   Reviewed chart for medication changes ahead of medication coordination call.  No OVs, Consults, or hospital visits since last care coordination call/Pharmacist visit.  No medication changes indicated   BP Readings from Last 3 Encounters:  01/28/20 140/70  01/18/20 (!) 148/72  11/30/19 122/71    Lab Results  Component Value Date   HGBA1C 7.3 (H) 01/28/2020     Patient obtains medications through Adherence Packaging  90 Days   Last adherence delivery included:  Memantine 10 mg 1 tab breakfast and bedtime Furosemide 20 mg 1 tab breakfast Potassium Cl Er 20 meq 1 tab breakfast Amlodipine 5 mg 1 tab breakfast Donepezil 10 mg 1 tab bedtime Metoprolol tartrate 100 mg 1 tab breakfast and bedtime Crestor 20 mg 1 tab daily  Patient is due for next adherence delivery on: 01/04/2021. Called patient and reviewed medications and coordinated delivery.  Patient declined all medications today, she has enough on hand until next scheduled delivery.  Wendy Poet, Clinical Pharmacist Assistant [facility]

## 2020-11-24 ENCOUNTER — Telehealth: Payer: Self-pay | Admitting: Pharmacist

## 2020-11-24 NOTE — Progress Notes (Signed)
    Chronic Care Management Pharmacy Assistant   Name: Martha Mata  MRN: [id number] DOB: May 07, 1941  Reason for Encounter: Medication Review  Patient Questions:  1.  Have you seen any other providers since your last visit? No  2.  Any changes in your medicines or health? No    PCP : Etta Grandchild, MD  Allergies:  No Known Allergies  Medications: Outpatient Encounter Medications as of 11/24/2020  Medication Sig  . amLODipine (NORVASC) 5 MG tablet Take 1 tablet (5 mg total) by mouth daily.  . cyanocobalamin 500 MCG tablet Take 500 mcg by mouth daily.  . Dietary Management Product (VASCULERA) TABS Take 1 capsule by mouth daily. (Patient not taking: Reported on 04/08/2020)  . donepezil (ARICEPT) 10 MG tablet Take 1 tablet (10 mg total) by mouth daily.  . furosemide (LASIX) 20 MG tablet Take 1 tablet (20 mg total) by mouth daily.  . memantine (NAMENDA) 10 MG tablet Take 1 tablet (10 mg total) by mouth 2 (two) times daily.  . metoprolol tartrate (LOPRESSOR) 100 MG tablet TAKE 1 TABLET BY MOUTH TWICE A DAY  . potassium chloride SA (KLOR-CON M20) 20 MEQ tablet Take 1 tablet (20 mEq total) by mouth daily.  . rosuvastatin (CRESTOR) 20 MG tablet Take 1 tablet (20 mg total) by mouth daily.   No facility-administered encounter medications on file as of 11/24/2020.    Current Diagnosis: Patient Active Problem List   Diagnosis Date Noted  . Venous stasis dermatitis of both lower extremities 01/28/2020  . Bilateral edema of lower extremity 01/18/2020  . Type II diabetes mellitus with manifestations (HCC) 08/26/2019  . Routine general medical examination at a health care facility 04/27/2019  . Hyperlipidemia LDL goal <130 04/27/2019  . Post-traumatic osteoarthritis of left knee 04/27/2019  . Hypertension 04/25/2018  . Alzheimer's disease (HCC) 05/17/2016    Goals Addressed   None      BP Readings from Last 3 Encounters:  01/28/20 140/70  01/18/20 (!) 148/72  11/30/19 122/71     Lab Results  Component Value Date   HGBA1C 7.3 (H) 01/28/2020     Patient obtains medications through Adherence Packaging  90 Days   Last adherence delivery included (date:10/03/20): (medication name and frequency)  Memantine 10 mg 1 tab breakfast and bedtime Furosemide 20 mg 1 tab breakfast Potassium Cl Er 20 meq 1 tab breakfast Amlodipine 5 mg 1 tab breakfast Donepezil 10 mg 1 tab bedtime Metoprolol tartrate 100 mg 1 tab breakfast and bedtime Crestor 20 mg 1 tab daily   Patient is due for next adherence delivery on: 01/04/2021. Called patient and reviewed medication needs.  Patient declined need for refills at this time. Patient is aware of how to contact pharmacist if refills are needed prior to next adherence delivery.   Horald Pollen, Clinical Pharmacy Assistant [facility] [phone]

## 2020-11-30 ENCOUNTER — Ambulatory Visit: Payer: Medicare Other | Admitting: Family Medicine

## 2020-11-30 ENCOUNTER — Encounter: Payer: Self-pay | Admitting: Family Medicine

## 2020-11-30 DIAGNOSIS — F0390 Unspecified dementia without behavioral disturbance: Secondary | ICD-10-CM | POA: Diagnosis not present

## 2020-11-30 MED ORDER — MEMANTINE HCL 10 MG PO TABS: 10.0000 mg | ORAL_TABLET | Freq: Two times a day (BID) | ORAL | 3 refills | Status: DC

## 2020-11-30 MED ORDER — DONEPEZIL HCL 10 MG PO TABS: 10.0000 mg | ORAL_TABLET | Freq: Every day | ORAL | 3 refills | Status: DC

## 2020-11-30 NOTE — Progress Notes (Addendum)
PATIENT: Martha Mata DOB: 1941/07/07  REASON FOR VISIT: follow up HISTORY FROM: patient  Chief Complaint  Patient presents with  . Follow-up    Rm 2 with son (gardner) Pt is well, memory about the same per son     HISTORY OF PRESENT ILLNESS: Today 11/30/20  She returns today for follow-up. She presents with her son who aids in history. He reports that symptoms are stable. No significant changes since last being seen. She continues Aricept and Namenda and is tolerating well. She does continue to live alone but has a caregiver that comes multiple times a week. Medications are now dosed in a pill pack. She is doing very well with this. She is able to perform ADLs independently. She remains active. She goes to the memory center at [facility] 4 days a week. She has vaccinated and had her booster.  History (copied from previous notes) 11/30/2019 Martha Mata is a 80 y.o. female here today for follow up for dementia. She continues Aricept and Namenda. She is doing well. She continues to live alone. She does have a caregiver who comes 4-5 times a week and stays with her. Her son, who lives in [place], [place], comes on the weekends. She is able to move around the home without difficulty. No falls. No assistive devices. She is eating well. She has gained about 10 pounds since last being seen. This is contributed to being at home more and not being as active. She is able to fix a sandwich or warm foods but does not cook. She now requires assistance with medication administration. There were some concerns of her moving around medications in pill container. Caregiver now assists with administration and medications are secured. She does not drive.  Memory care center is back operating three days a week. Her son feels that, overall, she is doing very well.    HISTORY: (copied from [place] note on 11/26/2018)  Martha Mata is a 80 year old female with a history of dementia.  She returns  today for follow-up.  She remains on Aricept and Namenda.  She currently lives at home.  She does have a caregiver that is with her in the morning that she goes to a adult day center during the day.  She requires assistance with all ADLs.  She no longer prepares any meals but she is able to make sandwiches and heat up food. her family help manage her finances.  The caregiver and her son helps with her medications.  No significant change in her mood or behavior.  Denies any trouble sleeping.  No hallucinations.  She returns today for evaluation.  HISTORY /05/2018: Here for follow up on dementia. Stable on Namenda and Aricept. She is here with her son. Lives at home with daily aid. No drastic changes. No falls. Patient feels happy and she feels good. Appetite is good. Sleeping is good. Here with son and caretaker. Lives with son and has a caretaker. She goes to a Health visitor 3x a week.   05/27/2017:Martha Mata is a 80 year old female with a history of memory disturbance. She returns today for follow-up. She is here today with her son. She reports that she continues to live at home. She does have caregivers that checks on her frequently. She does stay at home alone at night. She does have a life alert necklace that she can use it needed. Her son denies any episodes of wandering. Patient is able to complete all ADLs independently.  Her son reports that there is limited cooking. She does cook bacon and and soups. He denies any trouble using the stove. Her son manages all of her finances. She denies any trouble sleeping. Denies hallucinations. Denies any changes with her mood or behavior. She does usepublic transportationto go to a connections group. She returns today for an evaluation.  11/26/2016:Martha Mata is a 80 year old female with a history of memory disturbance. She returns today for follow-up. She is here today with her caretaker and son. They report that her memory has remained stable. She  continues to live at home alone. She does have caregivers that check on her throughout the day. She reports that she is able to complete most ADLs independently. She doesprepareher own meals without difficulty. Her son helps her with her finances. She does not operate a motor vehicle. Denies any trouble sleeping. Denies getting up at night wandering around the house. Denies hallucinations. Denies restlessness or agitation. Overall she feels that she is doing well. Family agrees with this. She is on Aricept and tolerating it well. She returns today for an evaluation.  [id number] Martha Buchanonis a 80 y.o.femalehere as a referral from Dr. Anthoney Harada memory loss. Here with caretaker who provides information. She is here as a follow up. Memory changes started about 3 years ago. Slowly progressive, slowly getting worse. Started with short-term memory problems, repeating conversations. Caretaker says she loses her keys, she had to put a key rack in her room, she forgets things she is told, asks the same questions over and over again, forgets conversations, she cooks quick things doesn't cook big meals anymore. Caretaker is there a few hours a day. She lives alone in a condo. Caretaker does the driving, no accidents in the home, no falls. Son keep a camera in the house to watch her and make sure she is safe, he lives in [place]. Discussed assisted living close to son, recommended that she look into it. No hallucinations, agitation, no issues at all. No depression, no behavioral problems, she is still very social, goes to the [employer] weekly. Son has taken over finances and caring for the house in the last few years. She takes OTC b12. TSH has been normal in the past.   Reviewed notes, labs and imaging from outside physicians, which showed:  CBC nml, CMP with creatinine is 0.86 normal labs were taken 03/06/2016, LDL 104. TSH in the past has been normal. Patient takes daily B12 supplementation so we'll  not check B12.  MRI of the brain 09/07/2013 (personally reviewed imaging and agree with the following):  Mildly abnormal MRI brain (without) demonstrating: 1. Few scattered foci of non-specific gliosis in the subcortical and juxtacortical white matter.  2. Single left occipital punctate focus of SWI hypointensity, may represent a cerebral microhemorrhage. 3. Above findings may be related to underlying mild chronic small vessel ischemic disease.   REVIEW OF SYSTEMS: Out of a complete 14 system review of symptoms, the patient complains only of the following symptoms, none and all other reviewed systems are negative.  ALLERGIES: No Known Allergies  HOME MEDICATIONS: Outpatient Medications Prior to Visit  Medication Sig Dispense Refill  . amLODipine (NORVASC) 5 MG tablet Take 1 tablet (5 mg total) by mouth daily. 90 tablet 1  . cyanocobalamin 500 MCG tablet Take 500 mcg by mouth daily.    . furosemide (LASIX) 20 MG tablet Take 1 tablet (20 mg total) by mouth daily. 90 tablet 1  . metoprolol tartrate (LOPRESSOR) 100 MG  tablet TAKE 1 TABLET BY MOUTH TWICE A DAY 180 tablet 1  . potassium chloride SA (KLOR-CON M20) 20 MEQ tablet Take 1 tablet (20 mEq total) by mouth daily. 90 tablet 1  . rosuvastatin (CRESTOR) 20 MG tablet Take 1 tablet (20 mg total) by mouth daily. 90 tablet 1  . donepezil (ARICEPT) 10 MG tablet Take 1 tablet (10 mg total) by mouth daily. 90 tablet 3  . memantine (NAMENDA) 10 MG tablet Take 1 tablet (10 mg total) by mouth 2 (two) times daily. 180 tablet 3  . Dietary Management Product (VASCULERA) TABS Take 1 capsule by mouth daily. (Patient not taking: Reported on 04/08/2020) 30 tablet 11   No facility-administered medications prior to visit.    PAST MEDICAL HISTORY: Past Medical History:  Diagnosis Date  . Fibroid, uterine   . GERD (gastroesophageal reflux disease)    no meds  . Hypertension   . Memory loss of unknown cause    per pt's admission  . Vitamin D  deficiency disease     PAST SURGICAL HISTORY: Past Surgical History:  Procedure Laterality Date  . CERVIX LESION DESTRUCTION    . COLONOSCOPY  2005  . DILATATION & CURRETTAGE/HYSTEROSCOPY WITH RESECTOCOPE N/A 08/14/2013   Procedure: DILATATION & CURETTAGE/HYSTEROSCOPY WITH RESECTOCOPE;  Surgeon: Marvene Staff, MD;  Location: [facility];  Service: Gynecology;  Laterality: N/A;  . DILATION AND CURETTAGE OF UTERUS    . HYSTEROSCOPY    . KNEE ARTHROCENTESIS  2010  . TUBAL LIGATION    . UTERINE FIBROID SURGERY  2005    FAMILY HISTORY: History reviewed. No pertinent family history.  SOCIAL HISTORY: Social History   Socioeconomic History  . Marital status: Widowed    Spouse name: Not on file  . Number of children: 2  . Years of education: 12+  . Highest education level: Not on file  Occupational History  . Occupation: Retired  Tobacco Use  . Smoking status: Never Smoker  . Smokeless tobacco: Never Used  Vaping Use  . Vaping Use: Never used  Substance and Sexual Activity  . Alcohol use: Yes    Comment: occasionally,one glass of wine monthly  . Drug use: No  . Sexual activity: Not on file  Other Topics Concern  . Not on file  Social History Narrative   Patient is retired.    Patient is a non-smoker    Patient lives at home alone. Has a caretaker who comes in to help as needed.   Patient consumes tea and coffee (1-2 cups daily)   Patient is right handed.   Patient has a college education.   Patient has two children.   Social Determinants of Health   Financial Resource Strain: Low Risk   . Difficulty of Paying Living Expenses: Not hard at all  Food Insecurity: Not on file  Transportation Needs: No Transportation Needs  . Lack of Transportation (Medical): No  . Lack of Transportation (Non-Medical): No  Physical Activity: Not on file  Stress: Not on file  Social Connections: Not on file  Intimate Partner Violence: Not on file      PHYSICAL EXAM  Vitals:    11/30/20 1102  BP: (!) 148/61  Pulse: (!) 56  Weight: 244 lb (110.7 kg)  Height: 5\' 6"  (1.676 m)   Body mass index is 39.38 kg/m.  Generalized: Well developed, in no acute distress  Cardiology: normal rate and rhythm, no murmur noted Respiratory: clear to auscultation bilaterally  Neurological examination  Mentation: Alert,  not oriented to time, place, or history taking. Follows intermittent commands speech and language fluent Cranial nerve II-XII: Pupils were equal round reactive to light. Extraocular movements were full, visual field were full on confrontational test. Facial sensation and strength were normal.  Motor: The motor testing reveals 5 over 5 strength of all 4 extremities. Good symmetric motor tone is noted throughout.  Sensory: Sensory testing is intact to soft touch on all 4 extremities. No evidence of extinction is noted.  Coordination: unable to complete Gait and station: Gait is short but stable    DIAGNOSTIC DATA (LABS, IMAGING, TESTING) - I reviewed patient records, labs, notes, testing and imaging myself where available.  MMSE - Mini Mental State Exam 11/30/2020 11/26/2018 05/27/2017  Not completed: Unable to complete (No Data) -  Orientation to time - 0 0  Orientation to Place - 0 2  Registration - 3 3  Attention/ Calculation - 0 0  Recall - 0 1  Language- name 2 objects - 1 2  Language- repeat - 1 0  Language- follow 3 step command - 2 2  Language- read & follow direction - 0 1  Write a sentence - 0 0  Copy design - 0 0  Total score - 7 11     Lab Results  Component Value Date   WBC 5.9 08/26/2019   HGB 12.4 08/26/2019   HCT 37.5 08/26/2019   MCV 93.6 08/26/2019   PLT 265.0 08/26/2019      Component Value Date/Time   NA 142 01/28/2020 1129   K 4.1 01/28/2020 1129   CL 107 01/28/2020 1129   CO2 28 01/28/2020 1129   GLUCOSE 183 (H) 01/28/2020 1129   BUN 15 01/28/2020 1129   CREATININE 1.08 01/28/2020 1129   CALCIUM 9.2 01/28/2020 1129   PROT 7.1  04/29/2019 1148   ALBUMIN 3.6 04/29/2019 1148   AST 12 04/29/2019 1148   ALT 11 04/29/2019 1148   ALKPHOS 65 04/29/2019 1148   BILITOT 0.4 04/29/2019 1148   GFRNONAA 48 (L) 08/22/2017 0850   GFRAA 55 (L) 08/22/2017 0850   Lab Results  Component Value Date   CHOL 187 04/29/2019   HDL 44.00 04/29/2019   LDLCALC 124 (H) 04/29/2019   TRIG 95.0 04/29/2019   CHOLHDL 4 04/29/2019   Lab Results  Component Value Date   HGBA1C 7.3 (H) 01/28/2020   Lab Results  Component Value Date   VITAMINB12 >1999 (H) 08/11/2013   Lab Results  Component Value Date   TSH 2.45 04/29/2019       ASSESSMENT AND PLAN 80 y.o. year old female  has a past medical history of Fibroid, uterine, GERD (gastroesophageal reflux disease), Hypertension, Memory loss of unknown cause, and Vitamin D deficiency disease. here with     ICD-10-CM   1. Dementia without behavioral disturbance, unspecified dementia type (HCC)  F03.90 donepezil (ARICEPT) 10 MG tablet    memantine (NAMENDA) 10 MG tablet     Overall, Martha Mata is doing well. We will continue Aricpet and Namenda. I have reviewed progression of disease with her son. We have discussed safety precautions. She will continue close follow up with PCP. She will follow up with me in 1 year, sooner if needed. Her son verbalizes understanding and agreement with this plan.    No orders of the defined types were placed in this encounter.    Meds ordered this encounter  Medications  . donepezil (ARICEPT) 10 MG tablet    Sig: Take 1 tablet (  10 mg total) by mouth daily.    Dispense:  90 tablet    Refill:  3    Order Specific Question:   Supervising Provider    Answer:   Melvenia Beam [id number]  . memantine (NAMENDA) 10 MG tablet    Sig: Take 1 tablet (10 mg total) by mouth 2 (two) times daily.    Dispense:  180 tablet    Refill:  3    Order Specific Question:   Supervising Provider    Answer:   Melvenia Beam [id number]      I spent 25 minutes with the  patient. 50% of this time was spent counseling and educating patient on plan of care and medications.     Debbora Presto, FNP-C 11/30/2020, 11:31 AM [facility] [address], [place], [place] [address] [phone]  Made any corrections needed, and agree with history, physical, neuro exam,assessment and plan as stated.     Sarina Ill, MD [facility]

## 2020-11-30 NOTE — Patient Instructions (Signed)
Below is our plan:  We will continue Aricept 10mg  daily and Namenda 10mg  twice daily.   Please make sure you are staying well hydrated. I recommend 50-60 ounces daily. Well balanced diet and regular exercise encouraged.    Please continue follow up with care team as directed.   Follow up with me in 1 year   You may receive a survey regarding today's visit. I encourage you to leave honest feed back as I do use this information to improve patient care. Thank you for seeing me today!      Memory Compensation Strategies  1. Use "WARM" strategy.  W= write it down  A= associate it  R= repeat it  M= make a mental note  2.   You can keep a Social worker.  Use a 3-ring notebook with sections for the following: calendar, important names and phone numbers,  medications, doctors' names/phone numbers, lists/reminders, and a section to journal what you did  each day.   3.    Use a calendar to write appointments down.  4.    Write yourself a schedule for the day.  This can be placed on the calendar or in a separate section of the Memory Notebook.  Keeping a  regular schedule can help memory.  5.    Use medication organizer with sections for each day or morning/evening pills.  You may need help loading it  6.    Keep a basket, or pegboard by the door.  Place items that you need to take out with you in the basket or on the pegboard.  You may also want to  include a message board for reminders.  7.    Use sticky notes.  Place sticky notes with reminders in a place where the task is performed.  For example: " turn off the  stove" placed by the stove, "lock the door" placed on the door at eye level, " take your medications" on  the bathroom mirror or by the place where you normally take your medications.  8.    Use alarms/timers.  Use while cooking to remind yourself to check on food or as a reminder to take your medicine, or as a  reminder to make a call, or as a reminder to perform  another task, etc.   Dementia Caregiver Guide Dementia is a term used to describe a number of symptoms that affect memory and thinking. The most common symptoms include:  Memory loss.  Trouble with language and communication.  Trouble concentrating.  Poor judgment and problems with reasoning.  Wandering from home or public places.  Extreme anxiety or depression.  Being suspicious or having angry outbursts and accusations.  Child-like behavior and language. Dementia can be frightening and confusing. And taking care of someone with dementia can be challenging. This guide provides tips to help you when providing care for a person with dementia. How to help manage lifestyle changes Dementia usually gets worse slowly over time. In the early stages, people with dementia can stay independent and safe with some help. In later stages, they need help with daily tasks such as dressing, grooming, and using the bathroom. There are actions you can take to help a person manage his or her life while living with this condition. Communicating  When the person is talking or seems frustrated, make eye contact and hold the person's hand.  Ask specific questions that need yes or no answers.  Use simple words, short sentences, and a  calm voice. Only give one direction at a time.  When offering choices, limit the person to just one or two.  Avoid correcting the person in a negative way.  If the person is struggling to find the right words, gently try to help him or her. Preventing injury  Keep floors clear of clutter. Remove rugs, magazine racks, and floor lamps.  Keep hallways well lit, especially at night.  Put a handrail and nonslip mat in the bathtub or shower.  Put childproof locks on cabinets that contain dangerous items, such as medicines, alcohol, guns, toxic cleaning items, sharp tools or utensils, matches, and lighters.  For doors to the outside of the house, put the locks in places  where the person cannot see or reach them easily. This will help ensure that the person does not wander out of the house and get lost.  Be prepared for emergencies. Keep a list of emergency phone numbers and addresses in a convenient area.  Remove car keys and lock garage doors so that the person does not try to get in the car and drive.  Have the person wear a bracelet that tracks locations and identifies the person as having memory problems. This should be worn at all times for safety.   Helping with daily life  Keep the person on track with his or her routine.  Try to identify areas where the person may need help.  Be supportive, patient, calm, and encouraging.  Gently remind the person that adjusting to changes takes time.  Help with the tasks that the person has asked for help with.  Keep the person involved in daily tasks and decisions as much as possible.  Encourage conversation, but try not to get frustrated if the person struggles to find words or does not seem to appreciate your help.   How to recognize stress Look for signs of stress in yourself and in the person you are caring for. If you notice signs of stress, take steps to manage it. Symptoms of stress include:  Feeling anxious, irritable, frustrated, or angry.  Denying that the person has dementia or that his or her symptoms will not improve.  Feeling depressed, hopeless, or unappreciated.  Difficulty sleeping.  Difficulty concentrating.  Developing stress-related health problems.  Feeling like you have too little time for your own life. Follow these instructions at home: Take care of your health Make sure that you and the person you are caring for:  Get regular sleep.  Exercise regularly.  Eat regular, nutritious meals.  Take over-the-counter and prescription medicines only as told by your health care providers.  Drink enough fluid to keep your urine pale yellow.  Attend all scheduled health care  appointments.   General instructions  Join a support group with others who are caregivers.  Ask about respite care resources. Respite care can provide short-term care for the person so that you can have a regular break from the stress of caregiving.  Consider any safety risks and take steps to avoid them.  Organize medicines in a pill box for each day of the week.  Create a plan to handle any legal or financial matters. Get legal or financial advice if needed.  Keep a calendar in a central location to remind the person of appointments or other activities. Where to find support: Many individuals and organizations offer support. These include:  Support groups for people with dementia.  Support groups for caregivers.  Counselors or therapists.  Home health care services.  Adult day care centers. Where to find more information  Centers for Disease Control and Prevention: http://www.wolf.info/  Alzheimer's Association: [place].co.nz  Family Caregiver Alliance: www.caregiver.[place]: www.alzfdn.org Contact a health care provider if:  The person's health is rapidly getting worse.  You are no longer able to care for the person.  Caring for the person is affecting your physical and emotional health.  You are feeling depressed or anxious about caring for the person. Get help right away if:  The person threatens himself or herself, you, or anyone else.  You feel depressed or sad, or feel that you want to harm yourself. If you ever feel like your loved one may hurt himself or herself or others, or if he or she shares thoughts about taking his or her own life, get help right away. You can go to your nearest emergency department or:  Call your local emergency services (911 in the [place]  Call a suicide crisis helpline, such as the [place] at 979-320-9469. This is open 24 hours a day in the [place]  Text the Crisis Text Line at  939-016-7591 (in the [place].). Summary  Dementia is a term used to describe a number of symptoms that affect memory and thinking.  Dementia usually gets worse slowly over time.  Take steps to reduce the person's risk of injury and to plan for future care.  Caregivers need support, relief from caregiving, and time for their own lives. This information is not intended to replace advice given to you by your health care provider. Make sure you discuss any questions you have with your health care provider. Document Revised: 03/21/2020 Document Reviewed: 03/21/2020 Elsevier Patient Education  [place].

## 2020-12-21 ENCOUNTER — Telehealth: Payer: Self-pay | Admitting: Pharmacist

## 2020-12-21 NOTE — Progress Notes (Signed)
° ° °  Chronic Care Management Pharmacy Assistant   Name: Martha Mata  MRN: [id number] DOB: November 04, 1941  Reason for Encounter: Medication Review  Patient Questions:  1.  Have you seen any other providers since your last visit? Yes, patient seen NP Amy Lomax on 11/30/20  2.  Any changes in your medicines or health? No    PCP : Janith Lima, MD  Allergies:  No Known Allergies  Medications: Outpatient Encounter Medications as of 12/21/2020  Medication Sig   amLODipine (NORVASC) 5 MG tablet Take 1 tablet (5 mg total) by mouth daily.   cyanocobalamin 500 MCG tablet Take 500 mcg by mouth daily.   donepezil (ARICEPT) 10 MG tablet Take 1 tablet (10 mg total) by mouth daily.   furosemide (LASIX) 20 MG tablet Take 1 tablet (20 mg total) by mouth daily.   memantine (NAMENDA) 10 MG tablet Take 1 tablet (10 mg total) by mouth 2 (two) times daily.   metoprolol tartrate (LOPRESSOR) 100 MG tablet TAKE 1 TABLET BY MOUTH TWICE A DAY   potassium chloride SA (KLOR-CON M20) 20 MEQ tablet Take 1 tablet (20 mEq total) by mouth daily.   rosuvastatin (CRESTOR) 20 MG tablet Take 1 tablet (20 mg total) by mouth daily.   No facility-administered encounter medications on file as of 12/21/2020.    Current Diagnosis: Patient Active Problem List   Diagnosis Date Noted   Venous stasis dermatitis of both lower extremities 01/28/2020   Bilateral edema of lower extremity 01/18/2020   Type II diabetes mellitus with manifestations ([place]) 08/26/2019   Routine general medical examination at a health care facility 04/27/2019   Hyperlipidemia LDL goal <130 04/27/2019   Post-traumatic osteoarthritis of left knee 04/27/2019   Hypertension 04/25/2018   Alzheimer's disease ([place]) 05/17/2016    Goals Addressed   None     Follow-Up:  Coordination of Enhanced Pharmacy Services   Reviewed chart for medication changes ahead of medication coordination call.  No OVs, Consults, or hospital visits since  last care coordination call/Pharmacist visit.  No medication changes indicated   BP Readings from Last 3 Encounters:  11/30/20 (!) 148/61  01/28/20 140/70  01/18/20 (!) 148/72    Lab Results  Component Value Date   HGBA1C 7.3 (H) 01/28/2020     Patient obtains medications through Adherence Packaging  90 Days   Last adherence delivery included:  Memantine 10 mg 1 tab breakfast and bedtime Furosemide 20 mg 1 tab breakfast Potassium Cl Er 20 meq 1 tab breakfast Amlodipine 5 mg 1 tab breakfast Donepezil 10 mg 1 tab bedtime Metoprolol tartrate 100 mg 1 tab breakfast and bedtime Crestor 20 mg 1 tab daily  Patient is due for next adherence delivery on: 01/03/21. Called patient and reviewed medications and coordinated delivery.  This delivery to include: Memantine 10 mg 1 tab breakfast and bedtime Furosemide 20 mg 1 tab breakfast Potassium Cl Er 20 meq 1 tab breakfast Amlodipine 5 mg 1 tab breakfast Donepezil 10 mg 1 tab bedtime Metoprolol tartrate 100 mg 1 tab breakfast and bedtime Crestor 20 mg 1 tab daily   Patient needs refills for metoprolol, rosuvastatin, furosemide, potassium and amlodipine from Dr. Ronnald Ramp  Confirmed delivery date of 01/03/21, advised patient that pharmacy will contact them the morning of delivery.   Wendy Poet, [place] [phone]

## 2020-12-22 ENCOUNTER — Other Ambulatory Visit: Payer: Self-pay | Admitting: Internal Medicine

## 2020-12-22 DIAGNOSIS — I10 Essential (primary) hypertension: Secondary | ICD-10-CM

## 2020-12-22 DIAGNOSIS — E876 Hypokalemia: Secondary | ICD-10-CM

## 2020-12-22 DIAGNOSIS — T502X5A Adverse effect of carbonic-anhydrase inhibitors, benzothiadiazides and other diuretics, initial encounter: Secondary | ICD-10-CM

## 2020-12-28 ENCOUNTER — Telehealth: Payer: Medicare Other

## 2020-12-29 ENCOUNTER — Telehealth: Payer: Medicare Other

## 2020-12-29 NOTE — Progress Notes (Deleted)
Chronic Care Management Pharmacy Note  12/29/2020 Name:  Martha Mata MRN:  [id number] DOB:  February 06, 1941  Subjective: Martha Mata is an 80 y.o. year old female who is a primary patient of Janith Lima, MD.  The CCM team was consulted for assistance with disease management and care coordination needs.    Engaged with patient by telephone for follow up visit in response to provider referral for pharmacy case management and/or care coordination services.   Consent to Services:  The patient was given the following information about Chronic Care Management services today, agreed to services, and gave verbal consent: 1. CCM service includes personalized support from designated clinical staff supervised by the primary care provider, including individualized plan of care and coordination with other care providers 2. 24/7 contact phone numbers for assistance for urgent and routine care needs. 3. Service will only be billed when office clinical staff spend 20 minutes or more in a month to coordinate care. 4. Only one practitioner may furnish and bill the service in a calendar month. 5.The patient may stop CCM services at any time (effective at the end of the month) by phone call to the office staff. 6. The patient will be responsible for cost sharing (co-pay) of up to 20% of the service fee (after annual deductible is met). Patient agreed to services and consent obtained.  Patient Care Team: Janith Lima, MD as PCP - General (Internal Medicine) [name], [facility] as Pharmacist (Pharmacist)  Recent office visits: ***  Recent consult visits: 11/30/20 NP Amy Ubaldo Glassing (neurology): f/u memory issues. Tolerating Aricept and Namenda. Lives alone but caregiver comes multiple times a week. No med changes.  Hospital visits: {Hospital DC Yes/No:25215}  Objective:  Lab Results  Component Value Date   CREATININE 1.08 01/28/2020   BUN 15 01/28/2020   GFR 59.17 (L) 01/28/2020   GFRNONAA  48 (L) 08/22/2017   GFRAA 55 (L) 08/22/2017   NA 142 01/28/2020   K 4.1 01/28/2020   CALCIUM 9.2 01/28/2020   CO2 28 01/28/2020    Lab Results  Component Value Date/Time   HGBA1C 7.3 (H) 01/28/2020 11:29 AM   HGBA1C 7.2 (H) 08/26/2019 01:36 PM   GFR 59.17 (L) 01/28/2020 11:29 AM   GFR 59.23 (L) 08/26/2019 01:36 PM   MICROALBUR <0.7 08/26/2019 01:36 PM    Last diabetic Eye exam: No results found for: HMDIABEYEEXA  Last diabetic Foot exam: No results found for: HMDIABFOOTEX   Lab Results  Component Value Date   CHOL 187 04/29/2019   HDL 44.00 04/29/2019   LDLCALC 124 (H) 04/29/2019   TRIG 95.0 04/29/2019   CHOLHDL 4 04/29/2019    Hepatic Function Latest Ref Rng & Units 04/29/2019 04/25/2018 08/22/2017  Total Protein 6.0 - 8.3 g/dL 7.1 7.6 7.1  Albumin 3.5 - 5.2 g/dL 3.6 3.8 3.6  AST 0 - 37 U/L _0 ALT 0 - 35 U/L _1 Alk Phosphatase 39 - 117 U/L 65 67 66  Total Bilirubin 0.2 - 1.2 mg/dL 0.4 0.3 0.5    Lab Results  Component Value Date/Time   TSH 2.45 04/29/2019 11:48 AM   TSH 1.15 04/25/2018 02:08 PM    CBC Latest Ref Rng & Units 08/26/2019 04/29/2019 04/25/2018  WBC 4.0 - 10.5 K/uL 5.9 5.4 5.7  Hemoglobin 12.0 - 15.0 g/dL 12.4 11.9(L) 12.1  Hematocrit 36.0 - 46.0 % 37.5 36.2 36.1  Platelets 150.0 - 400.0 K/uL 265.0 289.0 307.0  Lab Results  Component Value Date/Time   VD25OH 33.45 08/26/2019 01:36 PM    Clinical ASCVD: {YES/NO:21197} The ASCVD Risk score Mikey Bussing DC Jr., et al., 2013) failed to calculate for the following reasons:   The 2013 ASCVD risk score is only valid for ages 72 to 47    Depression screen PHQ 2/9 01/28/2020 04/25/2018  Decreased Interest 0 0  Down, Depressed, Hopeless 0 0  PHQ - 2 Score 0 0     ***Other: (CHADS2VASc if Afib, MMRC or CAT for COPD, ACT, DEXA)  Social History   Tobacco Use  Smoking Status Never Smoker  Smokeless Tobacco Never Used   BP Readings from Last 3 Encounters:  11/30/20 (!) 148/61  01/28/20 140/70   01/18/20 (!) 148/72   Pulse Readings from Last 3 Encounters:  11/30/20 (!) 56  01/28/20 70  01/18/20 69   Wt Readings from Last 3 Encounters:  11/30/20 244 lb (110.7 kg)  01/28/20 233 lb (105.7 kg)  01/18/20 238 lb (108 kg)    Assessment/Interventions: Review of patient past medical history, allergies, medications, health status, including review of consultants reports, laboratory and other test data, was performed as part of comprehensive evaluation and provision of chronic care management services.   SDOH:  (Social Determinants of Health) assessments and interventions performed: {yes/no:20286}   CCM Care Plan  No Known Allergies  Medications Reviewed Today    Reviewed by Debbora Presto, NP (Nurse Practitioner) on 11/30/20 at 1131  Med List Status: <None>  Medication Order Taking? Sig Documenting Provider Last Dose Status Informant  amLODipine (NORVASC) 5 MG tablet [id number] Yes Take 1 tablet (5 mg total) by mouth daily. Janith Lima, MD Taking Active   cyanocobalamin 500 MCG tablet [id number] Yes Take 500 mcg by mouth daily. [provider] Taking Active Family Member  donepezil (ARICEPT) 10 MG tablet [id number]  Take 1 tablet (10 mg total) by mouth daily. Lomax, Amy, NP  Active   furosemide (LASIX) 20 MG tablet [id number] Yes Take 1 tablet (20 mg total) by mouth daily. Janith Lima, MD Taking Active   memantine [facility]) 10 MG tablet [id number]  Take 1 tablet (10 mg total) by mouth 2 (two) times daily. Lomax, Amy, NP  Active   metoprolol tartrate (LOPRESSOR) 100 MG tablet [id number] Yes TAKE 1 TABLET BY MOUTH TWICE A DAY Janith Lima, MD Taking Active   potassium chloride SA (KLOR-CON M20) 20 MEQ tablet [id number] Yes Take 1 tablet (20 mEq total) by mouth daily. Janith Lima, MD Taking Active   rosuvastatin (CRESTOR) 20 MG tablet [id number] Yes Take 1 tablet (20 mg total) by mouth daily. Janith Lima, MD Taking Active           Patient Active Problem List    Diagnosis Date Noted  . Venous stasis dermatitis of both lower extremities 01/28/2020  . Bilateral edema of lower extremity 01/18/2020  . Type II diabetes mellitus with manifestations ([place]) 08/26/2019  . Routine general medical examination at a health care facility 04/27/2019  . Hyperlipidemia LDL goal <130 04/27/2019  . Post-traumatic osteoarthritis of left knee 04/27/2019  . Hypertension 04/25/2018  . Alzheimer's disease ([place]) 05/17/2016    Immunization History  Administered Date(s) Administered  . Fluad Quad(high Dose 65+) 08/26/2019  . Influenza, High Dose Seasonal PF 08/06/2018  . Pneumococcal Conjugate-13 04/25/2018  . Pneumococcal Polysaccharide-23 04/27/2019  . Tdap 04/25/2018    Conditions to be addressed/monitored:  {USCCMDZASSESSMENTOPTIONS:23563}  There are no care plans that you recently  modified to display for this patient.    Medication Assistance: {MEDASSISTANCEINFO:25044}  Patient's preferred pharmacy is:  [facility], [address] [address] Phone: [phone] Fax: [phone]  [place], [address] [place] [address] Phone: [phone] Fax: [phone]  [facility] - [place], [place] - [address] [place] [address] Phone: [phone] Fax: [phone]  Uses pill box? {Yes or If no, why not?:20788} Pt endorses ***% compliance  We discussed: {Pharmacy options:24294} Patient decided to: {US Pharmacy Plan:23885}  Care Plan and Follow Up Patient Decision:  {FOLLOWUP:24991}  Plan: {CM FOLLOW UP PLAN:25073}  ***

## 2020-12-31 ENCOUNTER — Telehealth: Payer: Self-pay | Admitting: Internal Medicine

## 2020-12-31 DIAGNOSIS — E785 Hyperlipidemia, unspecified: Secondary | ICD-10-CM

## 2020-12-31 DIAGNOSIS — E876 Hypokalemia: Secondary | ICD-10-CM

## 2020-12-31 DIAGNOSIS — I10 Essential (primary) hypertension: Secondary | ICD-10-CM

## 2021-01-03 ENCOUNTER — Telehealth: Payer: Self-pay | Admitting: Pharmacist

## 2021-01-04 MED ORDER — POTASSIUM CHLORIDE CRYS ER 20 MEQ PO TBCR: 20.0000 meq | EXTENDED_RELEASE_TABLET | Freq: Every day | ORAL | 0 refills | Status: DC

## 2021-01-04 MED ORDER — AMLODIPINE BESYLATE 5 MG PO TABS: 5.0000 mg | ORAL_TABLET | Freq: Every day | ORAL | 0 refills | Status: DC

## 2021-01-04 MED ORDER — ROSUVASTATIN CALCIUM 20 MG PO TABS: 20.0000 mg | ORAL_TABLET | Freq: Every day | ORAL | 0 refills | Status: DC

## 2021-01-04 MED ORDER — FUROSEMIDE 20 MG PO TABS: 20.0000 mg | ORAL_TABLET | Freq: Every day | ORAL | 0 refills | Status: DC

## 2021-01-04 NOTE — Addendum Note (Signed)
Addended by: Hinda Kehr on: 01/04/2021 04:01 PM   Modules accepted: Orders

## 2021-01-04 NOTE — Telephone Encounter (Signed)
Patient has made an appointment for the first opening Dr.Jones has which is 3/9. Requesting refills until appointment.

## 2021-01-04 NOTE — Telephone Encounter (Signed)
Short supply sent

## 2021-01-06 NOTE — Telephone Encounter (Cosign Needed)
error 

## 2021-01-16 IMAGING — DX LEFT KNEE - COMPLETE 4+ VIEW
3 series · 3 of 3 positions shown · non-contrast
Comparison: None.

CLINICAL DATA: Fall 1 week ago with generalized pain.

EXAM:
LEFT KNEE - COMPLETE 4+ VIEW

[knee ap]
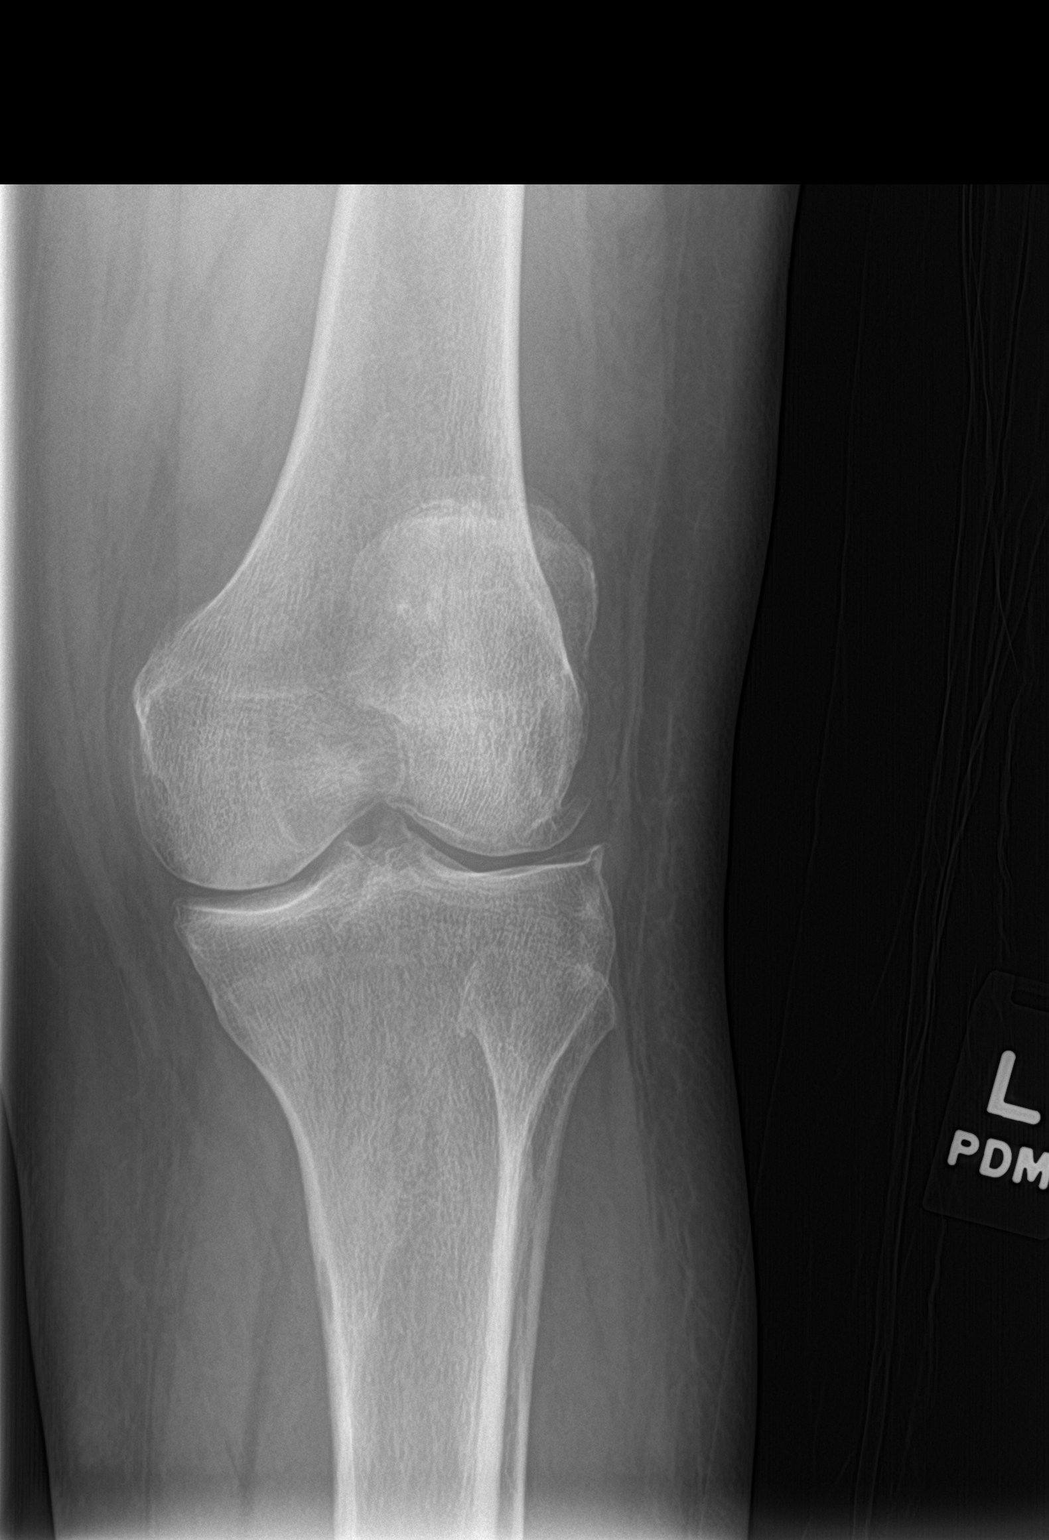

[knee tunnel]
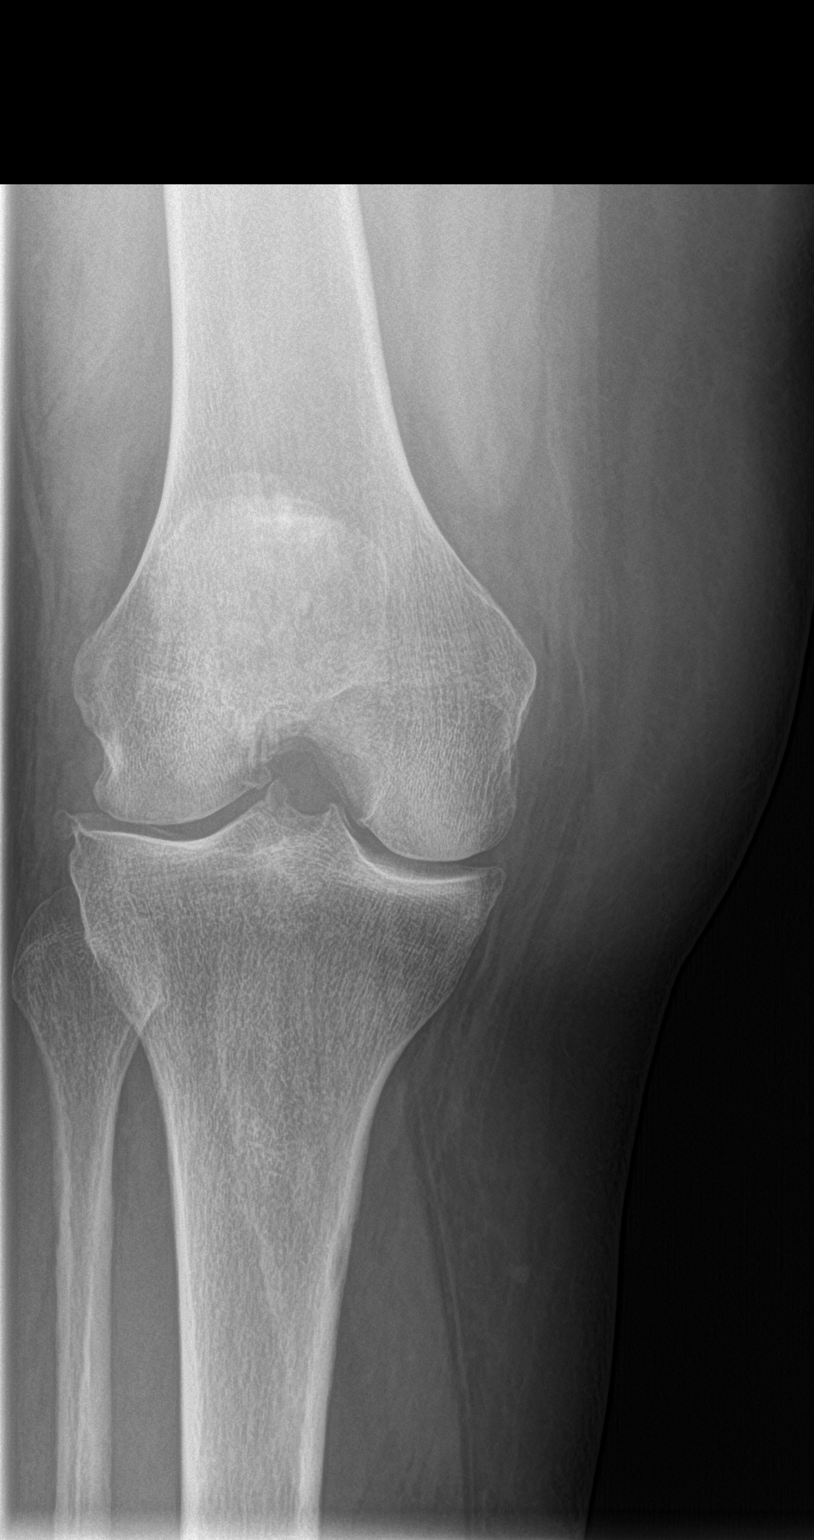

[knee lat]
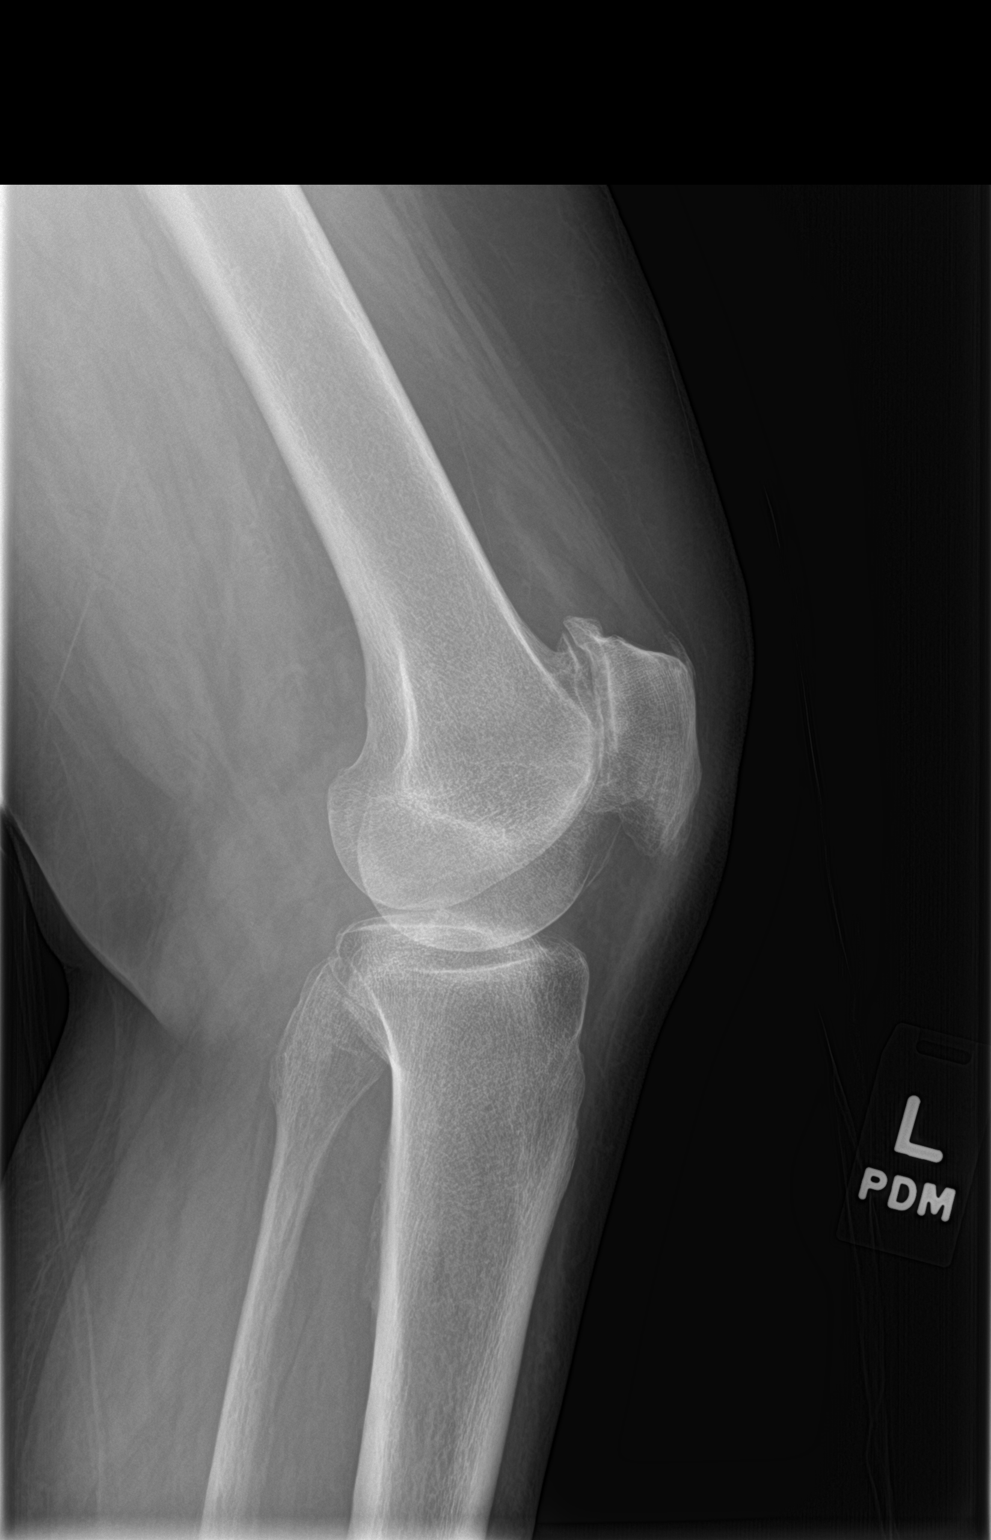

[3 of 3 positions shown; findings below may reference images not displayed]

FINDINGS: No acute fracture or dislocation. Three compartment osteoarthritis,
most significant at the patellofemoral articulation. No joint
effusion. Enthesophyte at the quadriceps insertion.
IMPRESSION: Degenerative change, without acute osseous finding.

## 2021-01-25 ENCOUNTER — Ambulatory Visit: Payer: Medicare Other | Admitting: Internal Medicine

## 2021-01-25 ENCOUNTER — Telehealth: Payer: Self-pay | Admitting: Pharmacist

## 2021-01-25 NOTE — Progress Notes (Signed)
° ° °  Chronic Care Management Pharmacy Assistant   Name: Martha Mata  MRN: [id number] DOB: 06/19/1941  Reason for Encounter: Medication Review    Medications: Outpatient Encounter Medications as of 01/25/2021  Medication Sig   amLODipine (NORVASC) 5 MG tablet Take 1 tablet (5 mg total) by mouth daily.   cyanocobalamin 500 MCG tablet Take 500 mcg by mouth daily.   donepezil (ARICEPT) 10 MG tablet Take 1 tablet (10 mg total) by mouth daily.   furosemide (LASIX) 20 MG tablet Take 1 tablet (20 mg total) by mouth daily.   memantine (NAMENDA) 10 MG tablet Take 1 tablet (10 mg total) by mouth 2 (two) times daily.   metoprolol tartrate (LOPRESSOR) 100 MG tablet TAKE 1 TABLET BY MOUTH TWICE A DAY   potassium chloride SA (KLOR-CON M20) 20 MEQ tablet Take 1 tablet (20 mEq total) by mouth daily.   rosuvastatin (CRESTOR) 20 MG tablet Take 1 tablet (20 mg total) by mouth daily.   No facility-administered encounter medications on file as of 01/25/2021.    Reviewed chart for medication changes ahead of medication coordination call.  No OVs, Consults, or hospital visits since last care coordination call/Pharmacist visit. (If appropriate, list visit date, provider name)  No medication changes indicated OR if recent visit, treatment plan here.  BP Readings from Last 3 Encounters:  11/30/20 (!) 148/61  01/28/20 140/70  01/18/20 (!) 148/72    Lab Results  Component Value Date   HGBA1C 7.3 (H) 01/28/2020     Patient obtains medications through Adherence Packaging  30 Days   Last adherence delivery included: (medication name and frequency) amLODipine (NORVASC) 5 MG 1  Tab every  morning furosemide (LASIX) 20 MG tablet every morning Metoprolol tartrate (Lopressor) 100 mg 1 tab every morning and bedtime memantine (NAMENDA) 10 MG tablet 1 tab at breakfast and bedtime Potassium chloride SA (KLOR-CON) 20 MEG tab 1 tab every morning rosuvastatin (Crestor) 20 mg tab 1 tab every  bedtime Donepezil 10 mg 1 tab every bedtime  Patient is due for next adherence delivery on: 02/07/21 Called patient and reviewed medications and coordinated delivery.  This delivery to include: amLODipine (NORVASC) 5 MG Take 1 Tab every  morning furosemide (LASIX) 20 MG tablet Take one tab every morning Metoprolol tartrate (Lopressor) 100 mg Take 1tab every morning and bedtime memantine (NAMENDA) 10 MG tablet Take 1 tab at breakfast and bedtime Potassium chloride SA (KLOR-CON) 20 MEG Take 1 tab every morning rosuvastatin (Crestor) 20 mg tab Take 1 tab everyday bedtime Donepezil 10 mg Take 1 tab at bedtime   Patient needs refills for . amLODipine (NORVASC) 5 MG  Take 1  Tab every  Morning-cpp requests furosemide (LASIX) 20 MG Take 1 tablet every morning-cpp requests Potassium chloride SA (KLOR-CON) 20 MEG tab Take 1 tab every morning-cpp requests rosuvastatin (Crestor) 20 mg tab Take 1 tab every bedtime-cpp requests Metoprolol tartrate (Lopressor) 100 mg  Take 1 tab every morning and bedtime-cpp requests   Confirmed delivery date of 02/07/21 advised patient that pharmacy will contact them the morning of delivery.  Georgiana Shore ,[place] Pharmacist Assistant [phone]   Time Spent: 81  mins

## 2021-02-07 ENCOUNTER — Other Ambulatory Visit: Payer: Self-pay

## 2021-02-07 ENCOUNTER — Ambulatory Visit (INDEPENDENT_AMBULATORY_CARE_PROVIDER_SITE_OTHER): Payer: Medicare Other | Admitting: Internal Medicine

## 2021-02-07 ENCOUNTER — Encounter: Payer: Self-pay | Admitting: Internal Medicine

## 2021-02-07 ENCOUNTER — Telehealth: Payer: Self-pay | Admitting: Internal Medicine

## 2021-02-07 VITALS — BP 132/68 | HR 83 | Temp 97.6°F | Resp 16 | Ht 66.0 in | Wt 227.0 lb

## 2021-02-07 DIAGNOSIS — E118 Type 2 diabetes mellitus with unspecified complications: Secondary | ICD-10-CM | POA: Diagnosis not present

## 2021-02-07 DIAGNOSIS — T502X5A Adverse effect of carbonic-anhydrase inhibitors, benzothiadiazides and other diuretics, initial encounter: Secondary | ICD-10-CM

## 2021-02-07 DIAGNOSIS — Z0000 Encounter for general adult medical examination without abnormal findings: Secondary | ICD-10-CM | POA: Diagnosis not present

## 2021-02-07 DIAGNOSIS — N1831 Chronic kidney disease, stage 3a: Secondary | ICD-10-CM

## 2021-02-07 DIAGNOSIS — E785 Hyperlipidemia, unspecified: Secondary | ICD-10-CM

## 2021-02-07 DIAGNOSIS — E876 Hypokalemia: Secondary | ICD-10-CM | POA: Diagnosis not present

## 2021-02-07 DIAGNOSIS — I10 Essential (primary) hypertension: Secondary | ICD-10-CM

## 2021-02-07 DIAGNOSIS — Z0001 Encounter for general adult medical examination with abnormal findings: Secondary | ICD-10-CM

## 2021-02-07 LAB — BASIC METABOLIC PANEL
BUN: 16 mg/dL (ref 6–23)
CO2: 30 mEq/L (ref 19–32)
Calcium: 9.4 mg/dL (ref 8.4–10.5)
Chloride: 106 mEq/L (ref 96–112)
Creatinine, Ser: 1.09 mg/dL (ref 0.40–1.20)
GFR: 48.03 mL/min — ABNORMAL LOW (ref >60.00)
Glucose, Bld: 216 mg/dL — ABNORMAL HIGH (ref 70–99)
Potassium: 4.2 mEq/L (ref 3.5–5.1)
Sodium: 143 mEq/L (ref 135–145)

## 2021-02-07 LAB — LIPID PANEL
Cholesterol: 111 mg/dL (ref 0–200)
HDL: 46.7 mg/dL (ref >39.00)
LDL Cholesterol: 50 mg/dL (ref 0–99)
NonHDL: 64.71
Total CHOL/HDL Ratio: 2
Triglycerides: 76 mg/dL (ref 0.0–149.0)
VLDL: 15.2 mg/dL (ref 0.0–40.0)

## 2021-02-07 LAB — HEPATIC FUNCTION PANEL
ALT: 10 U/L (ref 0–35)
AST: 11 U/L (ref 0–37)
Albumin: 3.8 g/dL (ref 3.5–5.2)
Alkaline Phosphatase: 87 U/L (ref 39–117)
Bilirubin, Direct: 0 mg/dL (ref 0.0–0.3)
Total Bilirubin: 0.3 mg/dL (ref 0.2–1.2)
Total Protein: 7.2 g/dL (ref 6.0–8.3)

## 2021-02-07 LAB — CBC WITH DIFFERENTIAL/PLATELET
Basophils Absolute: 0 10*3/uL (ref 0.0–0.1)
Basophils Relative: 0.3 % (ref 0.0–3.0)
Eosinophils Absolute: 0.1 10*3/uL (ref 0.0–0.7)
Eosinophils Relative: 2.6 % (ref 0.0–5.0)
HCT: 37.8 % (ref 36.0–46.0)
Hemoglobin: 12.5 g/dL (ref 12.0–15.0)
Lymphocytes Relative: 21.1 % (ref 12.0–46.0)
Lymphs Abs: 1.2 10*3/uL (ref 0.7–4.0)
MCHC: 33 g/dL (ref 30.0–36.0)
MCV: 94.5 fl (ref 78.0–100.0)
Monocytes Absolute: 0.5 10*3/uL (ref 0.1–1.0)
Monocytes Relative: 8.3 % (ref 3.0–12.0)
Neutro Abs: 3.8 10*3/uL (ref 1.4–7.7)
Neutrophils Relative %: 67.7 % (ref 43.0–77.0)
Platelets: 268 10*3/uL (ref 150.0–400.0)
RBC: 4 Mil/uL (ref 3.87–5.11)
RDW: 13.8 % (ref 11.5–15.5)
WBC: 5.5 10*3/uL (ref 4.0–10.5)

## 2021-02-07 LAB — URINALYSIS, ROUTINE W REFLEX MICROSCOPIC
Bilirubin Urine: NEGATIVE
Hgb urine dipstick: NEGATIVE
Ketones, ur: NEGATIVE
Leukocytes,Ua: NEGATIVE
Nitrite: NEGATIVE
RBC / HPF: NONE SEEN (ref 0–?)
Specific Gravity, Urine: 1.02 (ref 1.000–1.030)
Total Protein, Urine: NEGATIVE
Urine Glucose: NEGATIVE
Urobilinogen, UA: 0.2 (ref 0.0–1.0)
pH: 5.5 (ref 5.0–8.0)

## 2021-02-07 LAB — MICROALBUMIN / CREATININE URINE RATIO
Creatinine,U: 60.7 mg/dL
Microalb Creat Ratio: 1.2 mg/g (ref 0.0–30.0)
Microalb, Ur: <0.7 mg/dL (ref 0.0–1.9)

## 2021-02-07 LAB — TSH: TSH: 1.95 u[IU]/mL (ref 0.35–4.50)

## 2021-02-07 LAB — HEMOGLOBIN A1C: Hgb A1c MFr Bld: 7.9 % — ABNORMAL HIGH (ref 4.6–6.5)

## 2021-02-07 MED ORDER — DAPAGLIFLOZIN PROPANEDIOL 10 MG PO TABS
10.0000 mg | ORAL_TABLET | Freq: Every day | ORAL | 1 refills | Status: DC
Start: 2021-02-07 — End: 2021-03-07

## 2021-02-07 MED ORDER — POTASSIUM CHLORIDE CRYS ER 20 MEQ PO TBCR: 20.0000 meq | EXTENDED_RELEASE_TABLET | Freq: Every day | ORAL | 5 refills | Status: DC

## 2021-02-07 MED ORDER — AMLODIPINE BESYLATE 5 MG PO TABS: 5.0000 mg | ORAL_TABLET | Freq: Every day | ORAL | 5 refills | Status: DC

## 2021-02-07 MED ORDER — KERENDIA 10 MG PO TABS
1.0000 | ORAL_TABLET | Freq: Every day | ORAL | 0 refills | Status: DC
Start: 2021-02-07 — End: 2021-03-23

## 2021-02-07 MED ORDER — METOPROLOL TARTRATE 100 MG PO TABS: 100.0000 mg | ORAL_TABLET | Freq: Two times a day (BID) | ORAL | 1 refills | Status: DC

## 2021-02-07 MED ORDER — FUROSEMIDE 20 MG PO TABS: 20.0000 mg | ORAL_TABLET | Freq: Every day | ORAL | 5 refills | Status: DC

## 2021-02-07 MED ORDER — ROSUVASTATIN CALCIUM 20 MG PO TABS: 20.0000 mg | ORAL_TABLET | Freq: Every day | ORAL | 5 refills | Status: DC

## 2021-02-07 NOTE — Progress Notes (Signed)
Subjective:  Patient ID: Martha Mata, female    DOB: [birth year]  Age: 80 y.o. MRN: [id number]  CC: Annual Exam, Hypertension, Hyperlipidemia, and Diabetes  This visit occurred during the SARS-CoV-2 public health emergency.  Safety protocols were in place, including screening questions prior to the visit, additional usage of staff PPE, and extensive cleaning of exam room while observing appropriate contact time as indicated for disinfecting solutions.    HPI [name] presents for a CPX and f/up -   He caretaker tells me that she is doing well.  Outpatient Medications Prior to Visit  Medication Sig Dispense Refill  . cyanocobalamin 500 MCG tablet Take 500 mcg by mouth daily.    Marland Kitchen donepezil (ARICEPT) 10 MG tablet Take 1 tablet (10 mg total) by mouth daily. 90 tablet 3  . memantine (NAMENDA) 10 MG tablet Take 1 tablet (10 mg total) by mouth 2 (two) times daily. 180 tablet 3  . amLODipine (NORVASC) 5 MG tablet Take 1 tablet (5 mg total) by mouth daily. 30 tablet 0  . furosemide (LASIX) 20 MG tablet Take 1 tablet (20 mg total) by mouth daily. 30 tablet 0  . metoprolol tartrate (LOPRESSOR) 100 MG tablet TAKE 1 TABLET BY MOUTH TWICE A DAY 180 tablet 1  . potassium chloride SA (KLOR-CON M20) 20 MEQ tablet Take 1 tablet (20 mEq total) by mouth daily. 30 tablet 0  . rosuvastatin (CRESTOR) 20 MG tablet Take 1 tablet (20 mg total) by mouth daily. 30 tablet 0   No facility-administered medications prior to visit.    ROS Review of Systems  Constitutional: Negative for diaphoresis, fatigue and unexpected weight change.  HENT: Negative.   Eyes: Negative for visual disturbance.  Respiratory: Negative for cough, chest tightness, shortness of breath and wheezing.   Cardiovascular: Negative for chest pain, palpitations and leg swelling.  Gastrointestinal: Negative for abdominal pain, constipation, diarrhea, nausea and vomiting.  Endocrine: Negative.   Genitourinary: Negative.  Negative  for difficulty urinating.  Musculoskeletal: Negative for myalgias and neck pain.  Skin: Negative.  Negative for color change.  Neurological: Negative.   Hematological: Negative for adenopathy. Does not bruise/bleed easily.  Psychiatric/Behavioral: Negative.     Objective:  BP 132/68   Pulse 83   Temp 97.6 F (36.4 C) (Oral)   Resp 16   Ht 5\' 6"  (1.676 m)   Wt 227 lb (103 kg)   SpO2 96%   BMI 36.64 kg/m   BP Readings from Last 3 Encounters:  02/07/21 132/68  11/30/20 (!) 148/61  01/28/20 140/70    Wt Readings from Last 3 Encounters:  02/07/21 227 lb (103 kg)  11/30/20 244 lb (110.7 kg)  01/28/20 233 lb (105.7 kg)    Physical Exam Vitals reviewed.  HENT:     Nose: Nose normal.     Mouth/Throat:     Mouth: Mucous membranes are moist.  Eyes:     General: No scleral icterus.    Conjunctiva/sclera: Conjunctivae normal.  Cardiovascular:     Rate and Rhythm: Normal rate and regular rhythm.     Heart sounds: No murmur heard.   Pulmonary:     Effort: Pulmonary effort is normal.     Breath sounds: No stridor. No wheezing, rhonchi or rales.  Abdominal:     General: Abdomen is protuberant. Bowel sounds are normal. There is no distension.     Palpations: Abdomen is soft. There is no hepatomegaly, splenomegaly or mass.     Tenderness: There  is no abdominal tenderness.  Musculoskeletal:        General: Normal range of motion.     Cervical back: Neck supple.     Right lower leg: No edema.     Left lower leg: No edema.  Skin:    General: Skin is warm and dry.     Coloration: Skin is not pale.  Neurological:     General: No focal deficit present.     Mental Status: She is alert.  Psychiatric:        Mood and Affect: Mood normal.        Behavior: Behavior normal.     Lab Results  Component Value Date   WBC 5.5 02/07/2021   HGB 12.5 02/07/2021   HCT 37.8 02/07/2021   PLT 268.0 02/07/2021   GLUCOSE 216 (H) 02/07/2021   CHOL 111 02/07/2021   TRIG 76.0 02/07/2021    HDL 46.70 02/07/2021   LDLCALC 50 02/07/2021   ALT 10 02/07/2021   AST 11 02/07/2021   NA 143 02/07/2021   K 4.2 02/07/2021   CL 106 02/07/2021   CREATININE 1.09 02/07/2021   BUN 16 02/07/2021   CO2 30 02/07/2021   TSH 1.95 02/07/2021   INR 1.72 (H) 05/11/2010   HGBA1C 7.9 (H) 02/07/2021   MICROALBUR <0.7 02/07/2021    DG Knee Complete 4 Views Left  Result Date: 04/28/2019 CLINICAL DATA:  Fall 1 week ago with generalized pain. EXAM: LEFT KNEE - COMPLETE 4+ VIEW COMPARISON:  None. FINDINGS: No acute fracture or dislocation. Three compartment osteoarthritis, most significant at the patellofemoral articulation. No joint effusion. Enthesophyte at the quadriceps insertion. IMPRESSION: Degenerative change, without acute osseous finding. Electronically Signed   By: Abigail Miyamoto M.D.   On: 04/28/2019 08:10    Assessment & Plan:   [name] was seen today for annual exam, hypertension, hyperlipidemia and diabetes.  Diagnoses and all orders for this visit:  Diuretic-induced hypokalemia- Her potassium level is normal now. -     Basic metabolic panel; Future -     Basic metabolic panel  Hyperlipidemia LDL goal <130- LDL goal achieved. Doing well on the statin -     Lipid panel; Future -     TSH; Future -     Hepatic function panel; Future -     Hepatic function panel -     TSH -     Lipid panel  Type II diabetes mellitus with manifestations ([place])- Her A1c is up to 7.9%.  I recommended that she start taking an SGLT2 inhibitor. -     Basic metabolic panel; Future -     Microalbumin / creatinine urine ratio; Future -     Urinalysis, Routine w reflex microscopic; Future -     Hemoglobin A1c; Future -     HM Diabetes Foot Exam -     Ambulatory referral to Ophthalmology -     Hemoglobin A1c -     Urinalysis, Routine w reflex microscopic -     Microalbumin / creatinine urine ratio -     Basic metabolic panel -     Finerenone (KERENDIA) 10 MG TABS; Take 1 tablet by mouth daily. -      dapagliflozin propanediol (FARXIGA) 10 MG TABS tablet; Take 1 tablet (10 mg total) by mouth daily before breakfast.  Hypertension, unspecified type- Her BP is well controlled. -     CBC with Differential/Platelet; Future -     Basic metabolic panel; Future -  Urinalysis, Routine w reflex microscopic; Future -     TSH; Future -     Hepatic function panel; Future -     Hepatic function panel -     TSH -     Urinalysis, Routine w reflex microscopic -     Basic metabolic panel -     CBC with Differential/Platelet  Encounter for general adult medical examination with abnormal findings- exam completed, labs reviewed, vaccines reviewed, no cancer screenings indicated, pt ed material was give.  Stage 3a chronic kidney disease ([place])- Will start finerenone and dapagliflozin for cardiorenal risk reduction. -     Finerenone (KERENDIA) 10 MG TABS; Take 1 tablet by mouth daily. -     dapagliflozin propanediol (FARXIGA) 10 MG TABS tablet; Take 1 tablet (10 mg total) by mouth daily before breakfast.   I am having Samiha J. Schmoker start on Kerendia and dapagliflozin propanediol. I am also having her maintain her vitamin B-12, donepezil, and memantine.  Meds ordered this encounter  Medications  . Finerenone (KERENDIA) 10 MG TABS    Sig: Take 1 tablet by mouth daily.    Dispense:  30 tablet    Refill:  0  . dapagliflozin propanediol (FARXIGA) 10 MG TABS tablet    Sig: Take 1 tablet (10 mg total) by mouth daily before breakfast.    Dispense:  90 tablet    Refill:  1     Follow-up: Return in about 6 months (around 08/10/2021).  Scarlette Calico, MD

## 2021-02-07 NOTE — Telephone Encounter (Signed)
1.Medication Requested: amLODipine (NORVASC) 5 MG tablet  furosemide (LASIX) 20 MG tablet metoprolol tartrate (LOPRESSOR) 100 MG tablet  potassium chloride SA (KLOR-CON M20) 20 MEQ tablet  rosuvastatin (CRESTOR) 20 MG tablet    2. Pharmacy (Name, Street, [facility]): [facility] - [place], [place] - [place] [address]  3. On Med List: yes   4. Last Visit with PCP: 3.22.22  5. Next visit date with PCP: n/a    Caregiver called and said that after today that the patient will have no medication and is requesting a refill.    Agent: Please be advised that RX refills may take up to 3 business days. We ask that you follow-up with your pharmacy.

## 2021-02-07 NOTE — Patient Instructions (Signed)
Health Maintenance, Female Adopting a healthy lifestyle and getting preventive care are important in promoting health and wellness. Ask your health care provider about:  The right schedule for you to have regular tests and exams.  Things you can do on your own to prevent diseases and keep yourself healthy. What should I know about diet, weight, and exercise? Eat a healthy diet  Eat a diet that includes plenty of vegetables, fruits, low-fat dairy products, and lean protein.  Do not eat a lot of foods that are high in solid fats, added sugars, or sodium.   Maintain a healthy weight Body mass index (BMI) is used to identify weight problems. It estimates body fat based on height and weight. Your health care provider can help determine your BMI and help you achieve or maintain a healthy weight. Get regular exercise Get regular exercise. This is one of the most important things you can do for your health. Most adults should:  Exercise for at least 150 minutes each week. The exercise should increase your heart rate and make you sweat (moderate-intensity exercise).  Do strengthening exercises at least twice a week. This is in addition to the moderate-intensity exercise.  Spend less time sitting. Even light physical activity can be beneficial. Watch cholesterol and blood lipids Have your blood tested for lipids and cholesterol at 81 years of age, then have this test every 5 years. Have your cholesterol levels checked more often if:  Your lipid or cholesterol levels are high.  You are older than 81 years of age.  You are at high risk for heart disease. What should I know about cancer screening? Depending on your health history and family history, you may need to have cancer screening at various ages. This may include screening for:  Breast cancer.  Cervical cancer.  Colorectal cancer.  Skin cancer.  Lung cancer. What should I know about heart disease, diabetes, and high blood  pressure? Blood pressure and heart disease  High blood pressure causes heart disease and increases the risk of stroke. This is more likely to develop in people who have high blood pressure readings, are of African descent, or are overweight.  Have your blood pressure checked: ? Every 3-5 years if you are 18-39 years of age. ? Every year if you are 40 years old or older. Diabetes Have regular diabetes screenings. This checks your fasting blood sugar level. Have the screening done:  Once every three years after age 40 if you are at a normal weight and have a low risk for diabetes.  More often and at a younger age if you are overweight or have a high risk for diabetes. What should I know about preventing infection? Hepatitis B If you have a higher risk for hepatitis B, you should be screened for this virus. Talk with your health care provider to find out if you are at risk for hepatitis B infection. Hepatitis C Testing is recommended for:  Everyone born from 1945 through 1965.  Anyone with known risk factors for hepatitis C. Sexually transmitted infections (STIs)  Get screened for STIs, including gonorrhea and chlamydia, if: ? You are sexually active and are younger than 81 years of age. ? You are older than 81 years of age and your health care provider tells you that you are at risk for this type of infection. ? Your sexual activity has changed since you were last screened, and you are at increased risk for chlamydia or gonorrhea. Ask your health care provider   if you are at risk.  Ask your health care provider about whether you are at high risk for HIV. Your health care provider may recommend a prescription medicine to help prevent HIV infection. If you choose to take medicine to prevent HIV, you should first get tested for HIV. You should then be tested every 3 months for as long as you are taking the medicine. Pregnancy  If you are about to stop having your period (premenopausal) and  you may become pregnant, seek counseling before you get pregnant.  Take 400 to 800 micrograms (mcg) of folic acid every day if you become pregnant.  Ask for birth control (contraception) if you want to prevent pregnancy. Osteoporosis and menopause Osteoporosis is a disease in which the bones lose minerals and strength with aging. This can result in bone fractures. If you are 65 years old or older, or if you are at risk for osteoporosis and fractures, ask your health care provider if you should:  Be screened for bone loss.  Take a calcium or vitamin D supplement to lower your risk of fractures.  Be given hormone replacement therapy (HRT) to treat symptoms of menopause. Follow these instructions at home: Lifestyle  Do not use any products that contain nicotine or tobacco, such as cigarettes, e-cigarettes, and chewing tobacco. If you need help quitting, ask your health care provider.  Do not use street drugs.  Do not share needles.  Ask your health care provider for help if you need support or information about quitting drugs. Alcohol use  Do not drink alcohol if: ? Your health care provider tells you not to drink. ? You are pregnant, may be pregnant, or are planning to become pregnant.  If you drink alcohol: ? Limit how much you use to 0-1 drink a day. ? Limit intake if you are breastfeeding.  Be aware of how much alcohol is in your drink. In the [place] one drink equals one 12 oz bottle of beer (355 mL), one 5 oz glass of wine (148 mL), or one 1 oz glass of hard liquor (44 mL). General instructions  Schedule regular health, dental, and eye exams.  Stay current with your vaccines.  Tell your health care provider if: ? You often feel depressed. ? You have ever been abused or do not feel safe at home. Summary  Adopting a healthy lifestyle and getting preventive care are important in promoting health and wellness.  Follow your health care provider's instructions about healthy  diet, exercising, and getting tested or screened for diseases.  Follow your health care provider's instructions on monitoring your cholesterol and blood pressure. This information is not intended to replace advice given to you by your health care provider. Make sure you discuss any questions you have with your health care provider. Document Revised: 10/29/2018 Document Reviewed: 10/29/2018 Elsevier Patient Education  2021 Elsevier Inc.  

## 2021-02-09 ENCOUNTER — Encounter: Payer: Self-pay | Admitting: Internal Medicine

## 2021-02-10 ENCOUNTER — Encounter: Payer: Self-pay | Admitting: Internal Medicine

## 2021-02-16 ENCOUNTER — Telehealth: Payer: Self-pay | Admitting: Pharmacist

## 2021-02-16 NOTE — Progress Notes (Signed)
    Chronic Care Management Pharmacy Assistant   Name: Martha Mata  MRN: [id number] DOB: 1941-02-13   Reason for Encounter: Chart Review    Medications: Outpatient Encounter Medications as of 02/16/2021  Medication Sig  . amLODipine (NORVASC) 5 MG tablet Take 1 tablet (5 mg total) by mouth daily.  . cyanocobalamin 500 MCG tablet Take 500 mcg by mouth daily.  . dapagliflozin propanediol (FARXIGA) 10 MG TABS tablet Take 1 tablet (10 mg total) by mouth daily before breakfast.  . donepezil (ARICEPT) 10 MG tablet Take 1 tablet (10 mg total) by mouth daily.  . Finerenone (KERENDIA) 10 MG TABS Take 1 tablet by mouth daily.  . furosemide (LASIX) 20 MG tablet Take 1 tablet (20 mg total) by mouth daily.  . memantine (NAMENDA) 10 MG tablet Take 1 tablet (10 mg total) by mouth 2 (two) times daily.  . metoprolol tartrate (LOPRESSOR) 100 MG tablet Take 1 tablet (100 mg total) by mouth 2 (two) times daily.  . potassium chloride SA (KLOR-CON M20) 20 MEQ tablet Take 1 tablet (20 mEq total) by mouth daily.  . rosuvastatin (CRESTOR) 20 MG tablet Take 1 tablet (20 mg total) by mouth daily.   No facility-administered encounter medications on file as of 02/16/2021.    Reviewed chart for medication changes and adherence.   No gaps in adherence identified. Patient has follow up scheduled with pharmacy team. No further action required.   [place] Pharmacist Assistant [phone]

## 2021-02-27 ENCOUNTER — Telehealth: Payer: Self-pay | Admitting: Pharmacist

## 2021-02-28 NOTE — Progress Notes (Addendum)
    Chronic Care Management Pharmacy Assistant   Name: Martha Mata  MRN: [id number] DOB: Jul 15, 1941   Reason for Encounter: Medication Coordination Call    Recent office visits:  02/07/21 Dr. Scarlette Calico, added Dapagliflozin  Propanediol 10 mg and Finerenone 10 mg  Recent consult visits:  None ID  Hospital visits:  None in previous 6 months  Medications: Outpatient Encounter Medications as of 02/27/2021  Medication Sig   amLODipine (NORVASC) 5 MG tablet Take 1 tablet (5 mg total) by mouth daily.   cyanocobalamin 500 MCG tablet Take 500 mcg by mouth daily.   dapagliflozin propanediol (FARXIGA) 10 MG TABS tablet Take 1 tablet (10 mg total) by mouth daily before breakfast.   donepezil (ARICEPT) 10 MG tablet Take 1 tablet (10 mg total) by mouth daily.   Finerenone (KERENDIA) 10 MG TABS Take 1 tablet by mouth daily.   furosemide (LASIX) 20 MG tablet Take 1 tablet (20 mg total) by mouth daily.   memantine (NAMENDA) 10 MG tablet Take 1 tablet (10 mg total) by mouth 2 (two) times daily.   metoprolol tartrate (LOPRESSOR) 100 MG tablet Take 1 tablet (100 mg total) by mouth 2 (two) times daily.   potassium chloride SA (KLOR-CON M20) 20 MEQ tablet Take 1 tablet (20 mEq total) by mouth daily.   rosuvastatin (CRESTOR) 20 MG tablet Take 1 tablet (20 mg total) by mouth daily.   No facility-administered encounter medications on file as of 02/27/2021.   Reviewed chart for medication changes ahead of medication coordination call.  No OVs, Consults, or hospital visits since last care coordination call/Pharmacist visit. (If appropriate, list visit date, provider name)  No medication changes indicated OR if recent visit, treatment plan here.  BP Readings from Last 3 Encounters:  02/07/21 132/68  11/30/20 (!) 148/61  01/28/20 140/70    Lab Results  Component Value Date   HGBA1C 7.9 (H) 02/07/2021     Patient obtains medications through Adherence Packaging  30 Days   Last adherence  delivery included:  amLODipine (NORVASC) 5 MG Take 1 Tab every  morning furosemide (LASIX) 20 MG tablet Take one tab every morning Metoprolol tartrate (Lopressor) 100 mg Take 1tab every morning and bedtime memantine (NAMENDA) 10 MG tablet Take 1 tab at breakfast and bedtime Potassium chloride SA (KLOR-CON) 20 MEG Take 1 tab every morning rosuvastatin (Crestor) 20 mg tab Take 1 tab everyday bedtime Donepezil 10 mg Take 1 tab at bedtime    Patient is due for next adherence delivery on: 03/10/21. Called patient and reviewed medications and coordinated delivery.  This delivery to include: amLODipine (NORVASC) 5 MG Take 1 Tab every  morning furosemide (LASIX) 20 MG tablet Take one tab every morning Metoprolol tartrate (Lopressor) 100 mg Take 1tab every morning and bedtime memantine (NAMENDA) 10 MG tablet Take 1 tab at breakfast and bedtime Potassium chloride SA (KLOR-CON) 20 MEG Take 1 tab every morning rosuvastatin (Crestor) 20 mg tab Take 1 tab everyday bedtime Donepezil 10 mg Take 1 tab at bedtime Farxiga 10 mg 1 tab at breakfast    Confirmed delivery date of 03/10/21, advised patient that pharmacy will contact them the morning of delivery.    Star Rating Drugs: Rosuvastatin 20 mg 02/07/21 30 ds  Ethelene Hal Clinical Pharmacist Assistant [phone]

## 2021-03-07 ENCOUNTER — Other Ambulatory Visit: Payer: Self-pay | Admitting: Internal Medicine

## 2021-03-07 ENCOUNTER — Encounter: Payer: Self-pay | Admitting: Internal Medicine

## 2021-03-08 ENCOUNTER — Telehealth: Payer: Medicare Other

## 2021-03-08 NOTE — Telephone Encounter (Signed)
Martha Mata has been discontinued due to side effects. Patient has pill packs scheduled for delivery 4/22. Informed pharmacy to remove Farxiga from pill packs.

## 2021-03-23 ENCOUNTER — Ambulatory Visit (INDEPENDENT_AMBULATORY_CARE_PROVIDER_SITE_OTHER): Payer: Medicare Other | Admitting: Internal Medicine

## 2021-03-23 ENCOUNTER — Other Ambulatory Visit: Payer: Self-pay

## 2021-03-23 ENCOUNTER — Encounter: Payer: Self-pay | Admitting: Internal Medicine

## 2021-03-23 VITALS — BP 126/78 | HR 72 | Temp 98.2°F | Ht 66.0 in | Wt 225.0 lb

## 2021-03-23 DIAGNOSIS — E118 Type 2 diabetes mellitus with unspecified complications: Secondary | ICD-10-CM | POA: Diagnosis not present

## 2021-03-23 DIAGNOSIS — I10 Essential (primary) hypertension: Secondary | ICD-10-CM | POA: Diagnosis not present

## 2021-03-23 DIAGNOSIS — N1831 Chronic kidney disease, stage 3a: Secondary | ICD-10-CM | POA: Diagnosis not present

## 2021-03-23 DIAGNOSIS — E876 Hypokalemia: Secondary | ICD-10-CM | POA: Diagnosis not present

## 2021-03-23 DIAGNOSIS — T502X5A Adverse effect of carbonic-anhydrase inhibitors, benzothiadiazides and other diuretics, initial encounter: Secondary | ICD-10-CM | POA: Diagnosis not present

## 2021-03-23 LAB — BASIC METABOLIC PANEL
BUN: 17 mg/dL (ref 6–23)
CO2: 25 mEq/L (ref 19–32)
Calcium: 9.4 mg/dL (ref 8.4–10.5)
Chloride: 102 mEq/L (ref 96–112)
Creatinine, Ser: 1.17 mg/dL (ref 0.40–1.20)
GFR: 44.08 mL/min — ABNORMAL LOW (ref >60.00)
Glucose, Bld: 175 mg/dL — ABNORMAL HIGH (ref 70–99)
Potassium: 3.8 mEq/L (ref 3.5–5.1)
Sodium: 137 mEq/L (ref 135–145)

## 2021-03-23 MED ORDER — DAPAGLIFLOZIN PROPANEDIOL 10 MG PO TABS: 10.0000 mg | ORAL_TABLET | Freq: Every day | ORAL | 1 refills | Status: DC

## 2021-03-23 MED ORDER — KERENDIA 20 MG PO TABS: 1.0000 | ORAL_TABLET | Freq: Every day | ORAL | 1 refills | Status: DC

## 2021-03-23 NOTE — Progress Notes (Signed)
Subjective:  Patient ID: Martha Mata, female    DOB: [birth year]  Age: 80 y.o. MRN: [id number]  CC: Hypertension and Diabetes  This visit occurred during the SARS-CoV-2 public health emergency.  Safety protocols were in place, including screening questions prior to the visit, additional usage of staff PPE, and extensive cleaning of exam room while observing appropriate contact time as indicated for disinfecting solutions.    HPI [name] presents for f/up -  She is with her caretaker today.  She tells me that Ms. Martha Mata recently had an episode of altered mental status.  She says EMTs came to the house and her blood sugar was over 200.  It sounds like she is no longer taking [place].  She is taking [place] and returns today to have her potassium level rechecked.  Outpatient Medications Prior to Visit  Medication Sig Dispense Refill  . amLODipine (NORVASC) 5 MG tablet Take 1 tablet (5 mg total) by mouth daily. 30 tablet 5  . cyanocobalamin 500 MCG tablet Take 500 mcg by mouth daily.    Marland Kitchen donepezil (ARICEPT) 10 MG tablet Take 1 tablet (10 mg total) by mouth daily. 90 tablet 3  . furosemide (LASIX) 20 MG tablet Take 1 tablet (20 mg total) by mouth daily. 30 tablet 5  . memantine (NAMENDA) 10 MG tablet Take 1 tablet (10 mg total) by mouth 2 (two) times daily. 180 tablet 3  . metoprolol tartrate (LOPRESSOR) 100 MG tablet Take 1 tablet (100 mg total) by mouth 2 (two) times daily. 180 tablet 1  . potassium chloride SA (KLOR-CON M20) 20 MEQ tablet Take 1 tablet (20 mEq total) by mouth daily. 30 tablet 5  . rosuvastatin (CRESTOR) 20 MG tablet Take 1 tablet (20 mg total) by mouth daily. 30 tablet 5  . Finerenone (KERENDIA) 10 MG TABS Take 1 tablet by mouth daily. 30 tablet 0   No facility-administered medications prior to visit.    ROS Review of Systems  Constitutional: Negative for diaphoresis and fatigue.  HENT: Negative.   Eyes: Negative.   Respiratory: Negative for cough,  chest tightness, shortness of breath and wheezing.   Cardiovascular: Negative for chest pain, palpitations and leg swelling.  Gastrointestinal: Negative for abdominal pain, constipation and diarrhea.  Endocrine: Negative.  Negative for polydipsia, polyphagia and polyuria.  Genitourinary: Negative.  Negative for difficulty urinating.  Musculoskeletal: Negative.   Skin: Negative.   Neurological: Negative.  Negative for dizziness, weakness and light-headedness.  Hematological: Negative for adenopathy. Does not bruise/bleed easily.  Psychiatric/Behavioral: Negative.     Objective:  BP 126/78 (BP Location: Right Arm, Patient Position: Sitting, Cuff Size: Large)   Pulse 72   Temp 98.2 F (36.8 C) (Oral)   Ht 5\' 6"  (1.676 m)   Wt 225 lb (102.1 kg)   SpO2 97%   BMI 36.32 kg/m   BP Readings from Last 3 Encounters:  03/23/21 126/78  02/07/21 132/68  11/30/20 (!) 148/61    Wt Readings from Last 3 Encounters:  03/23/21 225 lb (102.1 kg)  02/07/21 227 lb (103 kg)  11/30/20 244 lb (110.7 kg)    Physical Exam Vitals reviewed.  Constitutional:      Appearance: Normal appearance.  HENT:     Mouth/Throat:     Mouth: Mucous membranes are moist.  Eyes:     General: No scleral icterus.    Conjunctiva/sclera: Conjunctivae normal.  Cardiovascular:     Rate and Rhythm: Normal rate and regular rhythm.  Heart sounds: No murmur heard.   Pulmonary:     Effort: Pulmonary effort is normal.     Breath sounds: No stridor. No wheezing, rhonchi or rales.  Abdominal:     General: Abdomen is protuberant. Bowel sounds are normal. There is no distension.     Palpations: Abdomen is soft. There is no hepatomegaly, splenomegaly or mass.  Musculoskeletal:     Cervical back: Neck supple.  Lymphadenopathy:     Cervical: No cervical adenopathy.  Skin:    General: Skin is warm and dry.  Neurological:     General: No focal deficit present.     Mental Status: She is alert.  Psychiatric:         Mood and Affect: Mood normal.        Behavior: Behavior normal.     Lab Results  Component Value Date   WBC 5.5 02/07/2021   HGB 12.5 02/07/2021   HCT 37.8 02/07/2021   PLT 268.0 02/07/2021   GLUCOSE 175 (H) 03/23/2021   CHOL 111 02/07/2021   TRIG 76.0 02/07/2021   HDL 46.70 02/07/2021   LDLCALC 50 02/07/2021   ALT 10 02/07/2021   AST 11 02/07/2021   NA 137 03/23/2021   K 3.8 03/23/2021   CL 102 03/23/2021   CREATININE 1.17 03/23/2021   BUN 17 03/23/2021   CO2 25 03/23/2021   TSH 1.95 02/07/2021   INR 1.72 (H) 05/11/2010   HGBA1C 7.9 (H) 02/07/2021   MICROALBUR <0.7 02/07/2021    DG Knee Complete 4 Views Left  Result Date: 04/28/2019 CLINICAL DATA:  Fall 1 week ago with generalized pain. EXAM: LEFT KNEE - COMPLETE 4+ VIEW COMPARISON:  None. FINDINGS: No acute fracture or dislocation. Three compartment osteoarthritis, most significant at the patellofemoral articulation. No joint effusion. Enthesophyte at the quadriceps insertion. IMPRESSION: Degenerative change, without acute osseous finding. Electronically Signed   By: Abigail Miyamoto M.D.   On: 04/28/2019 08:10    Assessment & Plan:   [name] was seen today for hypertension and diabetes.  Diagnoses and all orders for this visit:  Hypertension, unspecified type- Her blood pressure is adequately well controlled. -     Basic metabolic panel; Future -     Basic metabolic panel  Diuretic-induced hypokalemia- Her potassium level is normal now. -     Basic metabolic panel; Future -     Basic metabolic panel  Type II diabetes mellitus with manifestations ([place])- Her blood sugar is not adequately well controlled.  I recommended that she restart the SGLT2 inhibitor. -     Finerenone (KERENDIA) 20 MG TABS; Take 1 tablet by mouth daily. -     dapagliflozin propanediol (FARXIGA) 10 MG TABS tablet; Take 1 tablet (10 mg total) by mouth daily before breakfast.  Stage 3a chronic kidney disease ([place])- Her potassium level is normal.  Will  increase the dose of kerendia for renal protection. -     Finerenone (KERENDIA) 20 MG TABS; Take 1 tablet by mouth daily. -     dapagliflozin propanediol (FARXIGA) 10 MG TABS tablet; Take 1 tablet (10 mg total) by mouth daily before breakfast.   I have discontinued [employer]'s [name]. I am also having her start on Kerendia and dapagliflozin propanediol. Additionally, I am having her maintain her vitamin B-12, donepezil, memantine, rosuvastatin, furosemide, metoprolol tartrate, potassium chloride SA, and amLODipine.  Meds ordered this encounter  Medications  . Finerenone (KERENDIA) 20 MG TABS    Sig: Take 1 tablet by  mouth daily.    Dispense:  90 tablet    Refill:  1  . dapagliflozin propanediol (FARXIGA) 10 MG TABS tablet    Sig: Take 1 tablet (10 mg total) by mouth daily before breakfast.    Dispense:  90 tablet    Refill:  1     Follow-up: Return in about 6 months (around 09/23/2021).  Scarlette Calico, MD

## 2021-03-23 NOTE — Patient Instructions (Signed)
Type 2 Diabetes Mellitus, Diagnosis, Adult Type 2 diabetes (type 2 diabetes mellitus) is a long-term, or chronic, disease. In type 2 diabetes, one or both of these problems may be present:  The pancreas does not make enough of a hormone called insulin.  Cells in the body do not respond properly to insulin that the body makes (insulin resistance). Normally, insulin allows blood sugar (glucose) to enter cells in the body. The cells use glucose for energy. Insulin resistance or lack of insulin causes excess glucose to build up in the blood instead of going into cells. This causes high blood glucose (hyperglycemia).  What are the causes? The exact cause of type 2 diabetes is not known. What increases the risk? The following factors may make you more likely to develop this condition:  Having a family member with type 2 diabetes.  Being overweight or obese.  Being inactive (sedentary).  Having been diagnosed with insulin resistance.  Having a history of prediabetes, diabetes when you were pregnant (gestational diabetes), or polycystic ovary syndrome (PCOS). What are the signs or symptoms? In the early stage of this condition, you may not have symptoms. Symptoms develop slowly and may include:  Increased thirst or hunger.  Increased urination.  Unexplained weight loss.  Tiredness (fatigue) or weakness.  Vision changes, such as blurry vision.  Dark patches on the skin. How is this diagnosed? This condition is diagnosed based on your symptoms, your medical history, a physical exam, and your blood glucose level. Your blood glucose may be checked with one or more of the following blood tests:  A fasting blood glucose (FBG) test. You will not be allowed to eat (you will fast) for 8 hours or longer before a blood sample is taken.  A random blood glucose test. This test checks blood glucose at any time of day regardless of when you ate.  An A1C (hemoglobin A1C) blood test. This test  provides information about blood glucose levels over the previous 2-3 months.  An oral glucose tolerance test (OGTT). This test measures your blood glucose at two times: ? After fasting. This is your baseline blood glucose level. ? Two hours after drinking a beverage that contains glucose. You may be diagnosed with type 2 diabetes if:  Your fasting blood glucose level is 126 mg/dL (7.0 mmol/L) or higher.  Your random blood glucose level is 200 mg/dL (11.1 mmol/L) or higher.  Your A1C level is 6.5% or higher.  Your oral glucose tolerance test result is higher than 200 mg/dL (11.1 mmol/L). These blood tests may be repeated to confirm your diagnosis.   How is this treated? Your treatment may be managed by a specialist called an endocrinologist. Type 2 diabetes may be treated by following instructions from your health care provider about:  Making dietary and lifestyle changes. These may include: ? Following a personalized nutrition plan that is developed by a registered dietitian. ? Exercising regularly. ? Finding ways to manage stress.  Checking your blood glucose level as often as told.  Taking diabetes medicines or insulin daily. This helps to keep your blood glucose levels in the healthy range.  Taking medicines to help prevent complications from diabetes. Medicines may include: ? Aspirin. ? Medicine to lower cholesterol. ? Medicine to control blood pressure. Your health care provider will set treatment goals for you. Your goals will be based on your age, other medical conditions you have, and how you respond to diabetes treatment. Generally, the goal of treatment is to maintain the   following blood glucose levels:  Before meals: 80-130 mg/dL (4.4-7.2 mmol/L).  After meals: below 180 mg/dL (10 mmol/L).  A1C level: less than 7%. Follow these instructions at home: Questions to ask your health care provider Consider asking the following questions:  Should I meet with a certified  diabetes care and education specialist?  What diabetes medicines do I need, and when should I take them?  What equipment will I need to manage my diabetes at home?  How often do I need to check my blood glucose?  Where can I find a support group for people with diabetes?  What number can I call if I have questions?  When is my next appointment? General instructions  Take over-the-counter and prescription medicines only as told by your health care provider.  Keep all follow-up visits as told by your health care provider. This is important. Where to find more information  American Diabetes Association (ADA): www.diabetes.org  American Association of Diabetes Care and Education Specialists (ADCES): www.diabeteseducator.org  International Diabetes Federation (IDF): www.idf.org Contact a health care provider if:  Your blood glucose is at or above 240 mg/dL (13.3 mmol/L) for 2 days in a row.  You have been sick or have had a fever for 2 days or longer, and you are not getting better.  You have any of the following problems for more than 6 hours: ? You cannot eat or drink. ? You have nausea and vomiting. ? You have diarrhea. Get help right away if:  You have severe hypoglycemia. This means your blood glucose is lower than 54 mg/dL (3.0 mmol/L).  You become confused or you have trouble thinking clearly.  You have difficulty breathing.  You have moderate or large ketone levels in your urine. These symptoms may represent a serious problem that is an emergency. Do not wait to see if the symptoms will go away. Get medical help right away. Call your local emergency services (911 in the [place] Do not drive yourself to the hospital. Summary  Type 2 diabetes (type 2 diabetes mellitus) is a long-term, or chronic, disease. In type 2 diabetes, the pancreas does not make enough of a hormone called insulin, or cells in the body do not respond properly to insulin that the body makes (insulin  resistance).  This condition is treated by making dietary and lifestyle changes and taking diabetes medicines or insulin.  Your health care provider will set treatment goals for you. Your goals will be based on your age, other medical conditions you have, and how you respond to diabetes treatment.  Keep all follow-up visits as told by your health care provider. This is important. This information is not intended to replace advice given to you by your health care provider. Make sure you discuss any questions you have with your health care provider. Document Revised: 06/01/2020 Document Reviewed: 06/01/2020 Elsevier Patient Education  2021 Elsevier Inc.  

## 2021-04-03 ENCOUNTER — Encounter: Payer: Self-pay | Admitting: Internal Medicine

## 2021-04-06 ENCOUNTER — Other Ambulatory Visit: Payer: Self-pay

## 2021-04-06 ENCOUNTER — Encounter (HOSPITAL_COMMUNITY): Payer: Self-pay

## 2021-04-06 ENCOUNTER — Ambulatory Visit (HOSPITAL_COMMUNITY)
Admission: EM | Admit: 2021-04-06 | Discharge: 2021-04-06 | Disposition: A | Discharge status: Home / Self Care | Payer: Medicare Other | Attending: Emergency Medicine | Admitting: Emergency Medicine

## 2021-04-06 ENCOUNTER — Ambulatory Visit (INDEPENDENT_AMBULATORY_CARE_PROVIDER_SITE_OTHER): Payer: Medicare Other

## 2021-04-06 DIAGNOSIS — R059 Cough, unspecified: Secondary | ICD-10-CM

## 2021-04-06 DIAGNOSIS — R0602 Shortness of breath: Secondary | ICD-10-CM

## 2021-04-06 DIAGNOSIS — K449 Diaphragmatic hernia without obstruction or gangrene: Secondary | ICD-10-CM | POA: Diagnosis not present

## 2021-04-06 DIAGNOSIS — J069 Acute upper respiratory infection, unspecified: Secondary | ICD-10-CM

## 2021-04-06 DIAGNOSIS — R062 Wheezing: Secondary | ICD-10-CM | POA: Diagnosis not present

## 2021-04-06 MED ORDER — AMOXICILLIN 500 MG PO CAPS: 1000.0000 mg | ORAL_CAPSULE | Freq: Three times a day (TID) | ORAL | 0 refills | Status: AC

## 2021-04-06 NOTE — ED Provider Notes (Signed)
[facility]    CSN: [id number] Arrival date & time: 04/06/21  2703      History   Chief Complaint Chief Complaint  Patient presents with  . Cough  . Wheezing  . Vaginal Bleeding    HPI Martha Mata is a 80 y.o. female.   Patient here for evaluation of "shallow breathing" and cough that has been going on for the past several days.  Caregiver also reports noticing some blood in underwear this am.  Unsure if patient is having any urinary urgency or frequency.  Patient denies any dysuria.  Does have history of alzheimer's disease.  Denies any trauma, injury, or other precipitating event.  Denies any specific alleviating or aggravating factors.  Denies any fevers, chest pain, shortness of breath, N/V/D, numbness, tingling, weakness, abdominal pain, or headaches.     The history is provided by the patient and a caregiver. The history is limited by the condition of the patient.  Cough Associated symptoms: wheezing   Wheezing Associated symptoms: cough   Vaginal Bleeding   Past Medical History:  Diagnosis Date  . Fibroid, uterine   . GERD (gastroesophageal reflux disease)    no meds  . Hypertension   . Memory loss of unknown cause    per pt's admission  . Vitamin D deficiency disease     Patient Active Problem List   Diagnosis Date Noted  . Stage 3a chronic kidney disease ([place]) 03/23/2021  . Diuretic-induced hypokalemia 02/07/2021  . Encounter for general adult medical examination with abnormal findings 02/07/2021  . Venous stasis dermatitis of both lower extremities 01/28/2020  . Bilateral edema of lower extremity 01/18/2020  . Type II diabetes mellitus with manifestations ([place]) 08/26/2019  . Routine general medical examination at a health care facility 04/27/2019  . Hyperlipidemia LDL goal <130 04/27/2019  . Post-traumatic osteoarthritis of left knee 04/27/2019  . Hypertension 04/25/2018  . Alzheimer's disease ([place]) 05/17/2016    Past Surgical  History:  Procedure Laterality Date  . CERVIX LESION DESTRUCTION    . COLONOSCOPY  2005  . DILATATION & CURRETTAGE/HYSTEROSCOPY WITH RESECTOCOPE N/A 08/14/2013   Procedure: DILATATION & CURETTAGE/HYSTEROSCOPY WITH RESECTOCOPE;  Surgeon: Marvene Staff, MD;  Location: [facility];  Service: Gynecology;  Laterality: N/A;  . DILATION AND CURETTAGE OF UTERUS    . HYSTEROSCOPY    . KNEE ARTHROCENTESIS  2010  . TUBAL LIGATION    . UTERINE FIBROID SURGERY  2005    OB History   No obstetric history on file.      Home Medications    Prior to Admission medications   Medication Sig Start Date End Date Taking? Authorizing Provider  amoxicillin (AMOXIL) 500 MG capsule Take 2 capsules (1,000 mg total) by mouth 3 (three) times daily for 5 days. 04/06/21 04/11/21 Yes Pearson Forster, NP  amLODipine (NORVASC) 5 MG tablet Take 1 tablet (5 mg total) by mouth daily. 02/07/21   Janith Lima, MD  cyanocobalamin 500 MCG tablet Take 500 mcg by mouth daily.    [provider]  dapagliflozin propanediol (FARXIGA) 10 MG TABS tablet Take 1 tablet (10 mg total) by mouth daily before breakfast. 03/23/21   Janith Lima, MD  donepezil (ARICEPT) 10 MG tablet Take 1 tablet (10 mg total) by mouth daily. 11/30/20   Lomax, Amy, NP  Finerenone (KERENDIA) 20 MG TABS Take 1 tablet by mouth daily. 03/23/21   Janith Lima, MD  furosemide (LASIX) 20 MG tablet Take 1 tablet (  20 mg total) by mouth daily. 02/07/21   Janith Lima, MD  memantine (NAMENDA) 10 MG tablet Take 1 tablet (10 mg total) by mouth 2 (two) times daily. 11/30/20   Lomax, Amy, NP  metoprolol tartrate (LOPRESSOR) 100 MG tablet Take 1 tablet (100 mg total) by mouth 2 (two) times daily. 02/07/21   Janith Lima, MD  potassium chloride SA (KLOR-CON M20) 20 MEQ tablet Take 1 tablet (20 mEq total) by mouth daily. 02/07/21   Janith Lima, MD  rosuvastatin (CRESTOR) 20 MG tablet Take 1 tablet (20 mg total) by mouth daily. 02/07/21   Janith Lima, MD     Family History History reviewed. No pertinent family history.  Social History Social History   Tobacco Use  . Smoking status: Never Smoker  . Smokeless tobacco: Never Used  Vaping Use  . Vaping Use: Never used  Substance Use Topics  . Alcohol use: Yes    Comment: occasionally,one glass of wine monthly  . Drug use: No     Allergies   Patient has no known allergies.   Review of Systems Review of Systems  Respiratory: Positive for cough and wheezing.   Genitourinary: Positive for vaginal bleeding.  All other systems reviewed and are negative.    Physical Exam Triage Vital Signs ED Triage Vitals [04/06/21 1008]  Enc Vitals Group     BP 116/66     Pulse Rate (!) 56     Resp (!) 23     Temp 98.3 F (36.8 C)     Temp Source Oral     SpO2 100 %     Weight      Height      Head Circumference      Peak Flow      Pain Score      Pain Loc      Pain Edu?      Excl. in [place]?    No data found.  Updated Vital Signs BP 116/66 (BP Location: Left Arm)   Pulse (!) 56   Temp 98.3 F (36.8 C) (Oral)   Resp (!) 23   SpO2 100%   Visual Acuity Right Eye Distance:   Left Eye Distance:   Bilateral Distance:    Right Eye Near:   Left Eye Near:    Bilateral Near:     Physical Exam Vitals and nursing note reviewed.  Constitutional:      General: She is not in acute distress.    Appearance: Normal appearance. She is not ill-appearing, toxic-appearing or diaphoretic.  HENT:     Head: Normocephalic and atraumatic.  Eyes:     Conjunctiva/sclera: Conjunctivae normal.  Cardiovascular:     Rate and Rhythm: Normal rate.     Pulses: Normal pulses.  Pulmonary:     Effort: Pulmonary effort is normal.     Breath sounds: Examination of the right-upper field reveals decreased breath sounds. Examination of the left-upper field reveals rhonchi. Examination of the right-middle field reveals rhonchi. Examination of the right-lower field reveals decreased breath sounds.  Examination of the left-lower field reveals decreased breath sounds. Decreased breath sounds and rhonchi present.  Abdominal:     General: Abdomen is flat.  Musculoskeletal:        General: Normal range of motion.     Cervical back: Normal range of motion.  Skin:    General: Skin is warm and dry.  Neurological:     General: No focal deficit present.  Mental Status: She is alert and oriented to person, place, and time.  Psychiatric:        Mood and Affect: Mood normal.      UC Treatments / Results  Labs (all labs ordered are listed, but only abnormal results are displayed) Labs Reviewed  POCT URINALYSIS DIPSTICK, ED / UC    EKG   Radiology DG Chest 2 View  Result Date: 04/06/2021 CLINICAL DATA:  80 year old female with shortness of breath, wheezing and cough. EXAM: CHEST - 2 VIEW COMPARISON:  Chest radiographs 08/22/2017 and earlier. FINDINGS: Lower lung volumes today with patchy asymmetric opacity at the right lung base. Superimposed small to moderate gastric hiatal hernia. Other mediastinal contours are within normal limits. Visualized tracheal air column is within normal limits. No pneumothorax, pulmonary edema, pleural effusion or other confluent pulmonary opacity. No air bronchograms. No acute osseous abnormality identified. Negative visible bowel gas pattern. IMPRESSION: 1. Lower lung volumes with patchy asymmetric opacity at the right lung base, nonspecific but suspicious for developing infection in this setting. If infection is suspected then Followup PA and lateral chest X-ray is recommended in 3-4 weeks following trial of antibiotic therapy to ensure resolution. 2. Moderate chronic hiatal hernia. Electronically Signed   By: Genevie Ann M.D.   On: 04/06/2021 11:40    Procedures Procedures (including critical care time)  Medications Ordered in UC Medications - No data to display  Initial Impression / Assessment and Plan / UC Course  I have reviewed the triage vital signs  and the nursing notes.  Pertinent labs & imaging results that were available during my care of the patient were reviewed by me and considered in my medical decision making (see chart for details).    Xray with signs of developing lower lobe infection.  Will treat with amoxicillin three times a day for the next 5 days and follow up with primary care in 3-4 weeks for re-evaluation.  May continue to use OTC medications for symptom management.  Unable to obtain a urine sample to check for UTI at this time but no additional blood noted.  Encourage patient care giver to watch for additional bleeding and follow up with primary care as needed.  Final Clinical Impressions(s) / UC Diagnoses   Final diagnoses:  Acute upper respiratory infection     Discharge Instructions     Take the amoxicillin 2 pills three times a day for the next for the next 5 days.    Follow up with your primary care provider in the next 3-4 weeks.    If you notice any worsening bleeding or shortness of breath, go to the emergency department for further evaluation.     ED Prescriptions    Medication Sig Dispense Auth. Provider   amoxicillin (AMOXIL) 500 MG capsule Take 2 capsules (1,000 mg total) by mouth 3 (three) times daily for 5 days. 30 capsule Pearson Forster, NP     PDMP not reviewed this encounter.   Pearson Forster, NP 04/06/21 [phone]

## 2021-04-06 NOTE — Discharge Instructions (Addendum)
Take the amoxicillin 2 pills three times a day for the next for the next 5 days.    Follow up with your primary care provider in the next 3-4 weeks.    If you notice any worsening bleeding or shortness of breath, go to the emergency department for further evaluation.

## 2021-04-06 NOTE — ED Triage Notes (Incomplete)
Per caregiver, pt is having nasal congestion, cough and wheezing x 2 days. Mucinex give no relief.   Per caregiver, pt had some blood in the under wear this morning.

## 2021-04-11 ENCOUNTER — Encounter: Payer: Self-pay | Admitting: Internal Medicine

## 2021-04-11 ENCOUNTER — Ambulatory Visit (INDEPENDENT_AMBULATORY_CARE_PROVIDER_SITE_OTHER): Payer: Medicare Other | Admitting: Internal Medicine

## 2021-04-11 ENCOUNTER — Other Ambulatory Visit: Payer: Self-pay

## 2021-04-11 VITALS — BP 130/78 | HR 62 | Temp 98.1°F | Resp 18 | Ht 66.0 in | Wt 219.4 lb

## 2021-04-11 DIAGNOSIS — N939 Abnormal uterine and vaginal bleeding, unspecified: Secondary | ICD-10-CM | POA: Insufficient documentation

## 2021-04-11 NOTE — Patient Instructions (Signed)
We will check the urine and go from there.

## 2021-04-11 NOTE — Progress Notes (Signed)
   Subjective:   Patient ID: Martha Mata, female    DOB: September 16, 1941, 80 y.o.   MRN: [id number]  HPI The patient is an 80 YO female coming in with caregiver (on DPR) with new vaginal bleeding. Patient with dementia and unable to provide history. Caregiver states vaginal bleeding spotting since last Wednesday. This is enough to leak through underwear. She denies that this is enough to fill up a maxi pad. This has been fairly consistent since Wednesday. She is on a new antibiotic amoxicillin during that time for pneumonia. Her breathing is improving and almost back to normal. She denies fevers or chills. No abdominal pain, nausea, vomiting, diarrhea. The caregiver does not feel blood is from another source. She does have history of cervical lesion, endometrial polyps and fibroids. Had D/C 2014 last. No blood in stool or dark stools  Review of Systems  Unable to perform ROS: Dementia  Respiratory: Positive for cough. Negative for shortness of breath and wheezing.   Genitourinary:       Blood in undergarments    Objective:  Physical Exam Constitutional:      Appearance: She is well-developed.  HENT:     Head: Normocephalic and atraumatic.  Cardiovascular:     Rate and Rhythm: Normal rate and regular rhythm.  Pulmonary:     Effort: Pulmonary effort is normal. No respiratory distress.     Breath sounds: Normal breath sounds. No wheezing or rales.  Abdominal:     General: Bowel sounds are normal. There is no distension.     Palpations: Abdomen is soft.     Tenderness: There is no abdominal tenderness. There is no rebound.  Musculoskeletal:     Cervical back: Normal range of motion.  Skin:    General: Skin is warm and dry.  Neurological:     Mental Status: She is alert.     Coordination: Coordination normal.     Comments: Answers basic questions, not interactive during visit without direct questions     Vitals:   04/11/21 0918  BP: 130/78  Pulse: 62  Resp: 18  Temp: 98.1 F  (36.7 C)  TempSrc: Oral  SpO2: 90%  Weight: 219 lb 6.4 oz (99.5 kg)  Height: 5\' 6"  (1.676 m)    This visit occurred during the SARS-CoV-2 public health emergency.  Safety protocols were in place, including screening questions prior to the visit, additional usage of staff PPE, and extensive cleaning of exam room while observing appropriate contact time as indicated for disinfecting solutions.   Assessment & Plan:

## 2021-04-11 NOTE — Assessment & Plan Note (Signed)
Seems most likely source with her history. Checking U/A and culture to rule out infection and urine as source of bleeding. No reported hemorrhoids or blood in stool making rectal source less likely. Depending on results will likely need discussion about what workup seems appropriate depending on if this bleeding continues or stops.

## 2021-04-12 LAB — URINALYSIS, ROUTINE W REFLEX MICROSCOPIC
Bilirubin Urine: NEGATIVE
Ketones, ur: NEGATIVE
Leukocytes,Ua: NEGATIVE
Nitrite: NEGATIVE
Specific Gravity, Urine: 1.01 (ref 1.000–1.030)
Total Protein, Urine: NEGATIVE
Urine Glucose: NEGATIVE
Urobilinogen, UA: 0.2 (ref 0.0–1.0)
pH: 5.5 (ref 5.0–8.0)

## 2021-04-12 NOTE — Addendum Note (Signed)
Addended by: Jacobo Forest on: 04/12/2021 10:56 AM   Modules accepted: Orders

## 2021-04-13 ENCOUNTER — Encounter: Payer: Self-pay | Admitting: Internal Medicine

## 2021-04-13 LAB — URINE CULTURE: Result:: NO GROWTH

## 2021-04-18 ENCOUNTER — Other Ambulatory Visit: Payer: Self-pay | Admitting: Internal Medicine

## 2021-04-18 DIAGNOSIS — N950 Postmenopausal bleeding: Secondary | ICD-10-CM

## 2021-04-22 DIAGNOSIS — H524 Presbyopia: Secondary | ICD-10-CM | POA: Diagnosis not present

## 2021-04-24 ENCOUNTER — Encounter: Payer: Self-pay | Admitting: Internal Medicine

## 2021-04-24 DIAGNOSIS — N1831 Chronic kidney disease, stage 3a: Secondary | ICD-10-CM

## 2021-04-24 DIAGNOSIS — E118 Type 2 diabetes mellitus with unspecified complications: Secondary | ICD-10-CM

## 2021-04-24 MED ORDER — KERENDIA 20 MG PO TABS: 1.0000 | ORAL_TABLET | Freq: Every day | ORAL | 1 refills | Status: DC

## 2021-05-03 ENCOUNTER — Ambulatory Visit: Payer: Medicare Other | Admitting: Obstetrics and Gynecology

## 2021-05-03 ENCOUNTER — Other Ambulatory Visit: Payer: Self-pay

## 2021-05-03 ENCOUNTER — Other Ambulatory Visit (HOSPITAL_COMMUNITY)
Admission: RE | Admit: 2021-05-03 | Discharge: 2021-05-03 | Disposition: A | Discharge status: Home / Self Care | Payer: Medicare Other | Source: Ambulatory Visit | Attending: Obstetrics and Gynecology | Admitting: Obstetrics and Gynecology

## 2021-05-03 ENCOUNTER — Encounter: Payer: Self-pay | Admitting: Obstetrics and Gynecology

## 2021-05-03 VITALS — BP 140/80 | HR 88 | Wt 224.0 lb

## 2021-05-03 DIAGNOSIS — N950 Postmenopausal bleeding: Secondary | ICD-10-CM | POA: Diagnosis present

## 2021-05-03 DIAGNOSIS — Z124 Encounter for screening for malignant neoplasm of cervix: Secondary | ICD-10-CM

## 2021-05-03 NOTE — Progress Notes (Signed)
GYNECOLOGY  VISIT   HPI: 80 y.o.   Widowed  African American  female   No obstetric history on file. with No LMP recorded. Patient is postmenopausal.   here for PMB- bled for 2 weeks in May- none since.  Caregiver, [name], brings her in for her visit today and is present for the entire visit.   Caregiver indicates she is not having pain.  She indicates that patient appears to be in her usual state of health.   Her PCP did a urine evaluation 04/12/21.  The urinalysis showed large hemoglobin, 11-20 RBCs, 0-2 WBCs, few bacteria. Her final urine culture was negative.   Her last GYN visit was with Dr. Garwin Brothers before the pandemic began.   Her son is her health care power of attorney.  He lives in [place].  He and [name] are on her DPI.   GYNECOLOGIC HISTORY: No LMP recorded. Patient is postmenopausal. Contraception: post menopausal status. Menopausal hormone therapy: none Last mammogram:  2019 Last pap smear:  2019 Neg        OB History     Gravida  2   Para  2   Term      Preterm      AB      Living  2      SAB      IAB      Ectopic      Multiple      Live Births                 Patient Active Problem List   Diagnosis Date Noted   Vaginal bleeding 04/11/2021   Stage 3a chronic kidney disease ([place]) 03/23/2021   Diuretic-induced hypokalemia 02/07/2021   Encounter for general adult medical examination with abnormal findings 02/07/2021   Venous stasis dermatitis of both lower extremities 01/28/2020   Bilateral edema of lower extremity 01/18/2020   Type II diabetes mellitus with manifestations ([place]) 08/26/2019   Routine general medical examination at a health care facility 04/27/2019   Hyperlipidemia LDL goal <130 04/27/2019   Post-traumatic osteoarthritis of left knee 04/27/2019   Hypertension 04/25/2018   Alzheimer's disease ([place]) 05/17/2016    Past Medical History:  Diagnosis Date   Fibroid, uterine    GERD (gastroesophageal reflux disease)     no meds   Hypertension    Memory loss of unknown cause    per pt's admission   Vitamin D deficiency disease     Past Surgical History:  Procedure Laterality Date   CERVIX LESION DESTRUCTION     COLONOSCOPY  2005   DILATATION & CURRETTAGE/HYSTEROSCOPY WITH RESECTOCOPE N/A 08/14/2013   Procedure: DILATATION & CURETTAGE/HYSTEROSCOPY WITH RESECTOCOPE;  Surgeon: Marvene Staff, MD;  Location: [facility];  Service: Gynecology;  Laterality: N/A;   DILATION AND CURETTAGE OF UTERUS     HYSTEROSCOPY     KNEE ARTHROCENTESIS  2010   TUBAL LIGATION     UTERINE FIBROID SURGERY  2005    Current Outpatient Medications  Medication Sig Dispense Refill   amLODipine (NORVASC) 5 MG tablet Take 1 tablet (5 mg total) by mouth daily. 30 tablet 5   cyanocobalamin 500 MCG tablet Take 500 mcg by mouth daily.     donepezil (ARICEPT) 10 MG tablet Take 1 tablet (10 mg total) by mouth daily. 90 tablet 3   Finerenone (KERENDIA) 20 MG TABS Take 1 tablet by mouth daily. 90 tablet 1   furosemide (LASIX) 20 MG tablet Take 1 tablet (  20 mg total) by mouth daily. 30 tablet 5   memantine (NAMENDA) 10 MG tablet Take 1 tablet (10 mg total) by mouth 2 (two) times daily. 180 tablet 3   metoprolol tartrate (LOPRESSOR) 100 MG tablet Take 1 tablet (100 mg total) by mouth 2 (two) times daily. 180 tablet 1   potassium chloride SA (KLOR-CON M20) 20 MEQ tablet Take 1 tablet (20 mEq total) by mouth daily. 30 tablet 5   rosuvastatin (CRESTOR) 20 MG tablet Take 1 tablet (20 mg total) by mouth daily. 30 tablet 5   No current facility-administered medications for this visit.     ALLERGIES: Farxiga [dapagliflozin]  Family History  Problem Relation Age of Onset   Diabetes Son     Social History   Socioeconomic History   Marital status: Widowed    Spouse name: Not on file   Number of children: 2   Years of education: 12+   Highest education level: Not on file  Occupational History   Occupation: Retired  Tobacco Use    Smoking status: Never   Smokeless tobacco: Never  Vaping Use   Vaping Use: Never used  Substance and Sexual Activity   Alcohol use: Never    Comment: occasionally,one glass of wine monthly   Drug use: No   Sexual activity: Not Currently  Other Topics Concern   Not on file  Social History Narrative   Patient is retired.    Patient is a non-smoker    Patient lives at home alone. Has a caretaker who comes in to help as needed.   Patient consumes tea and coffee (1-2 cups daily)   Patient is right handed.   Patient has a college education.   Patient has two children.   Social Determinants of Health   Financial Resource Strain: Not on file  Food Insecurity: Not on file  Transportation Needs: No Transportation Needs   Lack of Transportation (Medical): No   Lack of Transportation (Non-Medical): No  Physical Activity: Not on file  Stress: Not on file  Social Connections: Not on file  Intimate Partner Violence: Not on file    Review of Systems  Constitutional: Negative.   HENT: Negative.    Eyes: Negative.   Respiratory: Negative.    Cardiovascular: Negative.   Gastrointestinal: Negative.   Endocrine: Negative.   Genitourinary:  Positive for vaginal bleeding.  Musculoskeletal: Negative.   Skin: Negative.   Allergic/Immunologic: Negative.   Neurological: Negative.   Hematological: Negative.   Psychiatric/Behavioral: Negative.     PHYSICAL EXAMINATION:    BP 140/80   Pulse 88   Wt 224 lb (101.6 kg)   SpO2 98%   BMI 36.15 kg/m     General appearance: alert, cooperative and appears stated age Head: Normocephalic, without obvious abnormality, atraumatic Neck: no adenopathy, supple, symmetrical, trachea midline and thyroid normal to inspection and palpation Lungs: clear to auscultation bilaterally Heart: regular rate and rhythm Abdomen: soft, non-tender, no masses,  no organomegaly Extremities: bilateral tense pitting endema of LEs. Skin: Skin color, texture, turgor  normal. No rashes or lesions No abnormal inguinal nodes palpated  Pelvic: External genitalia:  no lesions              Urethra:  normal appearing urethra with no masses, tenderness or lesions              Bartholins and Skenes: normal                 Vagina: normal appearing  vagina with normal color and discharge, no lesions              Cervix: no lesions.  No blood noted.                 Bimanual Exam:  Uterus:  slightly enlarged, nontender.              Adnexa: no mass, fullness, tenderness              Rectal exam: Yes.  .  Confirms.              Anus:  normal sphincter tone, no lesions  Chaperone was present for exam:  Earnest Bailey, CMA.   ASSESSMENT  Postmenopausal bleeding.  Hx fibroids and endometrial polyp.  Cervical cancer screening.  Chronic renal disease.  Alzheimer's.  DM.  PLAN  Postmenopausal bleeding causes discussed with her caregiver - atrophy, polyps, fibroids, precancer and cancer. Pap and reflex HR HPV.  Return for pelvic ultrasound and follow up visit.  Procedure explained.  Caregiver will have son provide a copy of patient's health care power of attorney.

## 2021-05-03 NOTE — Patient Instructions (Signed)
Postmenopausal Bleeding Postmenopausal bleeding is any bleeding that occurs after menopause. Menopause is a time in a woman's life when monthly periods stop. Any type of bleeding after menopause should be checked by your doctor. Treatment will depend on thecause. This kind of bleeding can be caused by: Taking hormones during menopause. Low or high amounts of female hormones in the body. This can cause the lining of the womb (uterus) to become too thin or too thick. Cancer. Growths in the womb that are not cancer. Follow these instructions at home:  Watch for any changes in your symptoms. Let your doctor know about them. Avoid using tampons and douches as told by your doctor. Change your pads regularly. Get regular pelvic exams. This includes Pap tests. Take iron pills as told by your doctor. Take over-the-counter and prescription medicines only as told by your doctor. Keep all follow-up visits. Contact a doctor if: You have new bleeding from the vagina after menopause. You have pain in your belly (abdomen). Get help right away if: You have a fever or chills. You have very bad pain with bleeding. You have clumps of blood (blood clots) coming from your vagina. You have a lot of bleeding, and: You use more than 1 pad an hour. This kind of bleeding has never happened before. You have headaches. You feel dizzy or you feel like you are going to pass out (faint). Summary Any type of bleeding after menopause should be checked by your doctor. Avoid using tampons or douches. Get regular pelvic exams. This includes Pap tests. Contact a doctor if you have new bleeding or pain in your belly. Watch for any changes in your symptoms. Let your doctor know about them. This information is not intended to replace advice given to you by your health care provider. Make sure you discuss any questions you have with your healthcare provider. Document Revised: 04/21/2020 Document Reviewed:  04/21/2020 Elsevier Patient Education  Secretary.

## 2021-05-04 ENCOUNTER — Other Ambulatory Visit: Payer: Self-pay

## 2021-05-04 DIAGNOSIS — N950 Postmenopausal bleeding: Secondary | ICD-10-CM

## 2021-05-05 LAB — CYTOLOGY - PAP
Adequacy: ABSENT
Diagnosis: NEGATIVE

## 2021-05-17 ENCOUNTER — Encounter: Payer: Self-pay | Admitting: Obstetrics and Gynecology

## 2021-05-25 ENCOUNTER — Other Ambulatory Visit: Payer: Self-pay

## 2021-05-25 ENCOUNTER — Other Ambulatory Visit (HOSPITAL_COMMUNITY)
Admission: RE | Admit: 2021-05-25 | Discharge: 2021-05-25 | Disposition: A | Discharge status: Home / Self Care | Payer: Medicare Other | Source: Ambulatory Visit | Attending: Obstetrics and Gynecology | Admitting: Obstetrics and Gynecology

## 2021-05-25 ENCOUNTER — Encounter: Payer: Self-pay | Admitting: Obstetrics and Gynecology

## 2021-05-25 ENCOUNTER — Ambulatory Visit (INDEPENDENT_AMBULATORY_CARE_PROVIDER_SITE_OTHER): Payer: Medicare Other

## 2021-05-25 ENCOUNTER — Ambulatory Visit: Payer: Medicare Other | Admitting: Obstetrics and Gynecology

## 2021-05-25 VITALS — BP 142/76 | HR 60 | Wt 224.0 lb

## 2021-05-25 DIAGNOSIS — N950 Postmenopausal bleeding: Secondary | ICD-10-CM

## 2021-05-25 DIAGNOSIS — R9389 Abnormal findings on diagnostic imaging of other specified body structures: Secondary | ICD-10-CM | POA: Insufficient documentation

## 2021-05-25 DIAGNOSIS — D219 Benign neoplasm of connective and other soft tissue, unspecified: Secondary | ICD-10-CM | POA: Diagnosis not present

## 2021-05-25 NOTE — Progress Notes (Signed)
GYNECOLOGY  VISIT   HPI: 80 y.o.   Widowed  [place] American  female   G2P2 with No LMP recorded. Patient is postmenopausal.   here for pelvic ultrasound for postmenopausal bleeding.  Patient's caregiver is with her today.   GYNECOLOGIC HISTORY: No LMP recorded. Patient is postmenopausal. Contraception:  PMP Menopausal hormone therapy:  none Last mammogram:  2020 Neg/Birads1 Last pap smear:  05/03/21 - normal.        OB History     Gravida  2   Para  2   Term      Preterm      AB      Living  2      SAB      IAB      Ectopic      Multiple      Live Births                 Patient Active Problem List   Diagnosis Date Noted   Vaginal bleeding 04/11/2021   Stage 3a chronic kidney disease ([place]) 03/23/2021   Diuretic-induced hypokalemia 02/07/2021   Encounter for general adult medical examination with abnormal findings 02/07/2021   Venous stasis dermatitis of both lower extremities 01/28/2020   Bilateral edema of lower extremity 01/18/2020   Type II diabetes mellitus with manifestations ([place]) 08/26/2019   Routine general medical examination at a health care facility 04/27/2019   Hyperlipidemia LDL goal <130 04/27/2019   Post-traumatic osteoarthritis of left knee 04/27/2019   Hypertension 04/25/2018   Alzheimer's disease ([place]) 05/17/2016    Past Medical History:  Diagnosis Date   Fibroid, uterine    GERD (gastroesophageal reflux disease)    no meds   Hypertension    Memory loss of unknown cause    per pt's admission   Vitamin D deficiency disease     Past Surgical History:  Procedure Laterality Date   CERVIX LESION DESTRUCTION     COLONOSCOPY  2005   DILATATION & CURRETTAGE/HYSTEROSCOPY WITH RESECTOCOPE N/A 08/14/2013   Procedure: DILATATION & CURETTAGE/HYSTEROSCOPY WITH RESECTOCOPE;  Surgeon: Marvene Staff, MD;  Location: [facility];  Service: Gynecology;  Laterality: N/A;   DILATION AND CURETTAGE OF UTERUS     HYSTEROSCOPY     KNEE  ARTHROCENTESIS  2010   TUBAL LIGATION     UTERINE FIBROID SURGERY  2005    Current Outpatient Medications  Medication Sig Dispense Refill   amLODipine (NORVASC) 5 MG tablet Take 1 tablet (5 mg total) by mouth daily. 30 tablet 5   cyanocobalamin 500 MCG tablet Take 500 mcg by mouth daily.     donepezil (ARICEPT) 10 MG tablet Take 1 tablet (10 mg total) by mouth daily. 90 tablet 3   Finerenone (KERENDIA) 20 MG TABS Take 1 tablet by mouth daily. 90 tablet 1   furosemide (LASIX) 20 MG tablet Take 1 tablet (20 mg total) by mouth daily. 30 tablet 5   memantine (NAMENDA) 10 MG tablet Take 1 tablet (10 mg total) by mouth 2 (two) times daily. 180 tablet 3   metoprolol tartrate (LOPRESSOR) 100 MG tablet Take 1 tablet (100 mg total) by mouth 2 (two) times daily. 180 tablet 1   potassium chloride SA (KLOR-CON M20) 20 MEQ tablet Take 1 tablet (20 mEq total) by mouth daily. 30 tablet 5   rosuvastatin (CRESTOR) 20 MG tablet Take 1 tablet (20 mg total) by mouth daily. 30 tablet 5   No current facility-administered medications for this visit.  ALLERGIES: Farxiga [dapagliflozin]  Family History  Problem Relation Age of Onset   Diabetes Son     Social History   Socioeconomic History   Marital status: Widowed    Spouse name: Not on file   Number of children: 2   Years of education: 12+   Highest education level: Not on file  Occupational History   Occupation: Retired  Tobacco Use   Smoking status: Never   Smokeless tobacco: Never  Vaping Use   Vaping Use: Never used  Substance and Sexual Activity   Alcohol use: Never    Comment: occasionally,one glass of wine monthly   Drug use: No   Sexual activity: Not Currently  Other Topics Concern   Not on file  Social History Narrative   Patient is retired.    Patient is a non-smoker    Patient lives at home alone. Has a caretaker who comes in to help as needed.   Patient consumes tea and coffee (1-2 cups daily)   Patient is right handed.    Patient has a college education.   Patient has two children.   Social Determinants of Health   Financial Resource Strain: Not on file  Food Insecurity: Not on file  Transportation Needs: No Transportation Needs   Lack of Transportation (Medical): No   Lack of Transportation (Non-Medical): No  Physical Activity: Not on file  Stress: Not on file  Social Connections: Not on file  Intimate Partner Violence: Not on file    Review of Systems  All other systems reviewed and are negative.  PHYSICAL EXAMINATION:    BP (!) 142/76   Pulse 60   Wt 224 lb (101.6 kg)   BMI 36.15 kg/m     General appearance: alert, cooperative and appears stated age   Pelvic: External genitalia:  no lesions              Urethra:  normal appearing urethra with no masses, tenderness or lesions              Bartholins and Skenes: normal                 Vagina: normal appearing vagina with normal color and discharge, no lesions              Cervix: no lesions                Bimanual Exam:  Uterus:  normal size, contour, position, consistency, mobility, non-tender              Adnexa: no mass, fullness, tenderness        Pelvic US  Uterus with 7 fibroids, intramural and subserosal, largest is 2.76 cm.  EMS 7.34 mm. Cystic with 3 mm calcification. Possible polyp and fibroid. Ovaries normal, difficult to visualized.  No free fluid.   EMB Consent done with patient, caregiver, and patient's son, Cinda Quest, by phone per DPR.  He is her healthcare power of attorney.  Sterile prep with Hibiclens.  Paracervical block 10 cc 1% lidocaine, lot [id number], exp 11/2022.  Os stenotic.  Scalpel used to open os.  Tenaculum to anterior cervical lip. Os finder passed.  Pipelle passed to 5 cm x 2.  Tissue to pathology.  No complications.  Minimal EBL.  Chaperone was present for exam:  Estill Bamberg, CMA.  ASSESSMENT  Postmenopausal bleeding.  Fibroids.  Thickened endometrium.  Possible polyp versus  fibroid.  PLAN  Phone conversation done in the exam room today.  Parties present were patient, her caregiver, [name], patient's son, Cinda Quest (on phone), CMA Estill Bamberg, and me to discuss postmenopausal bleeding, US findings, and endometrial biopsy versus hysteroscopy/dilation and curettage for endometrial sampling.  Endometrial biopsy chosen for tissue evaluation with hope of avoiding anesthesia if possible.  Will follow up biopsy results and final recommendations will be made then.

## 2021-05-25 NOTE — Patient Instructions (Signed)

## 2021-05-26 ENCOUNTER — Telehealth: Payer: Self-pay | Admitting: Pharmacist

## 2021-05-26 NOTE — Progress Notes (Signed)
Chronic Care Management Pharmacy Assistant   Name: Martha Mata  MRN: [id number] DOB: 1941-01-15   Reason for Encounter: Medication Review    Recent office visits:  03/23/21 Dr. Ronnald Ramp Internal Medicine ([facility]) medication changes Finerenone 20 mg and Farxiga 10 mg 04/11/21 Dr. Sharlet Salina Internal Medicine (Vaginal Bleeding) no medication changes  Recent consult visits:  05/03/21 and 05/25/21 Dr. Gala Romney Gynecology  [facility] visits:  Medication Reconciliation was completed by comparing discharge summary, patient's EMR and Pharmacy list, and upon discussion with patient.  Admitted to the hospital on 04/06/21 due to Acute upper respiratory infection. Discharge date was 04/06/21. Discharged from [facility] [place]?Medications Started at [facility] Discharge:?? -started amoxicillin 2 tabs 3 times daily for 5 days  Medication Changes at Hospital Discharge: -Changed None ID  Medications Discontinued at Hospital Discharge: -Stopped None ID  Medications that remain the same after Hospital Discharge:??  -All other medications will remain the same.    Medications: Outpatient Encounter Medications as of 05/26/2021  Medication Sig   amLODipine (NORVASC) 5 MG tablet Take 1 tablet (5 mg total) by mouth daily.   cyanocobalamin 500 MCG tablet Take 500 mcg by mouth daily.   donepezil (ARICEPT) 10 MG tablet Take 1 tablet (10 mg total) by mouth daily.   Finerenone (KERENDIA) 20 MG TABS Take 1 tablet by mouth daily.   furosemide (LASIX) 20 MG tablet Take 1 tablet (20 mg total) by mouth daily.   memantine (NAMENDA) 10 MG tablet Take 1 tablet (10 mg total) by mouth 2 (two) times daily.   metoprolol tartrate (LOPRESSOR) 100 MG tablet Take 1 tablet (100 mg total) by mouth 2 (two) times daily.   potassium chloride SA (KLOR-CON M20) 20 MEQ tablet Take 1 tablet (20 mEq total) by mouth daily.   rosuvastatin (CRESTOR) 20 MG tablet Take 1 tablet (20 mg total) by mouth daily.   No  facility-administered encounter medications on file as of 05/26/2021.   Pharmacist Review  Reviewed chart for medication changes ahead of medication coordination call.  No OVs, Consults, or hospital visits since last care coordination call/Pharmacist visit. (If appropriate, list visit date, provider name)  No medication changes indicated OR if recent visit, treatment plan here.  BP Readings from Last 3 Encounters:  05/25/21 (!) 142/76  05/03/21 140/80  04/11/21 130/78    Lab Results  Component Value Date   HGBA1C 7.9 (H) 02/07/2021     Patient obtains medications through Adherence Packaging  90 Days   Last adherence delivery included:  amLODipine (NORVASC) 5 MG Take 1 Tab every  morning furosemide (LASIX) 20 MG tablet Take one tab every morning Metoprolol tartrate (Lopressor) 100 mg Take 1tab every morning and bedtime memantine (NAMENDA) 10 MG tablet Take 1 tab at breakfast and bedtime Potassium chloride SA (KLOR-CON) 20 MEG Take 1 tab every morning rosuvastatin (Crestor) 20 mg tab Take 1 tab everyday bedtime Donepezil 10 mg Take 1 tab at bedtime Farxiga 10 mg 1 tab at breakfast   Patient is due for next adherence delivery on: 05/19/21.  Called patient and reviewed medications and coordinated delivery. This delivery to include: amLODipine (NORVASC) 5 MG Take 1 Tab every  morning furosemide (LASIX) 20 MG tablet Take one tab every morning Metoprolol tartrate (Lopressor) 100 mg Take 1tab every morning and bedtime memantine (NAMENDA) 10 MG tablet Take 1 tab at breakfast and bedtime Potassium chloride SA (KLOR-CON) 20 MEG Take 1 tab every morning rosuvastatin (Crestor) 20 mg  tab Take 1 tab everyday bedtime Donepezil 10 mg Take 1 tab at bedtime    Patient needs refills for none noted.  Confirmed delivery date of 06/06/21, advised patient that pharmacy will contact them the morning of delivery.   [place] Pharmacist Assistant [phone]   Time spent:30

## 2021-05-27 ENCOUNTER — Other Ambulatory Visit: Payer: Self-pay | Admitting: Internal Medicine

## 2021-05-27 DIAGNOSIS — E785 Hyperlipidemia, unspecified: Secondary | ICD-10-CM

## 2021-05-27 DIAGNOSIS — T502X5A Adverse effect of carbonic-anhydrase inhibitors, benzothiadiazides and other diuretics, initial encounter: Secondary | ICD-10-CM

## 2021-05-27 DIAGNOSIS — E876 Hypokalemia: Secondary | ICD-10-CM

## 2021-05-27 DIAGNOSIS — I10 Essential (primary) hypertension: Secondary | ICD-10-CM

## 2021-05-29 LAB — SURGICAL PATHOLOGY

## 2021-07-04 ENCOUNTER — Other Ambulatory Visit: Payer: Self-pay | Admitting: Internal Medicine

## 2021-07-04 ENCOUNTER — Encounter: Payer: Self-pay | Admitting: Internal Medicine

## 2021-07-04 DIAGNOSIS — F5104 Psychophysiologic insomnia: Secondary | ICD-10-CM

## 2021-07-04 MED ORDER — BELSOMRA 15 MG PO TABS: 15.0000 mg | ORAL_TABLET | Freq: Every evening | ORAL | 1 refills | Status: DC | PRN

## 2021-07-11 ENCOUNTER — Telehealth: Payer: Self-pay | Admitting: Pharmacist

## 2021-07-11 NOTE — Progress Notes (Signed)
Chronic Care Management Pharmacy Assistant   Name: Martha Mata  MRN: [id number] DOB: Aug 03, 1941   Reason for Encounter: Disease State   Conditions to be addressed/monitored: General   Recent office visits:  None ID  Recent consult visits:  None ID  Hospital visits:  Medication Reconciliation was completed by comparing discharge summary, patient's EMR and Pharmacy list, and upon discussion with patient.  Admitted to the hospital on 04/06/21 due to Acute upper respiratory infection. Discharge date was 04/06/21. Discharged from [facility]?Medications Started at [facility] Discharge:?? -started amoxicillin 2 tabs 3 times daily for 5 days  Medication Changes at Hospital Discharge: -Changed none noted  Medications Discontinued at Hospital Discharge: -Stopped none noted   Medications that remain the same after Hospital Discharge:??  -All other medications will remain the same.    Medications: Outpatient Encounter Medications as of 07/11/2021  Medication Sig   amLODipine (NORVASC) 5 MG tablet TAKE ONE TABLET BY MOUTH EVERY MORNING   cyanocobalamin 500 MCG tablet Take 500 mcg by mouth daily.   donepezil (ARICEPT) 10 MG tablet Take 1 tablet (10 mg total) by mouth daily.   Finerenone (KERENDIA) 20 MG TABS Take 1 tablet by mouth daily.   furosemide (LASIX) 20 MG tablet TAKE ONE TABLET BY MOUTH ONCE DAILY   memantine (NAMENDA) 10 MG tablet Take 1 tablet (10 mg total) by mouth 2 (two) times daily.   metoprolol tartrate (LOPRESSOR) 100 MG tablet TAKE ONE TABLET BY MOUTH EVERY MORNING and TAKE ONE TABLET BY MOUTH EVERYDAY AT BEDTIME   potassium chloride SA (KLOR-CON) 20 MEQ tablet TAKE ONE TABLET BY MOUTH EVERY MORNING   rosuvastatin (CRESTOR) 20 MG tablet TAKE ONE TABLET BY MOUTH EVERYDAY AT BEDTIME   Suvorexant (BELSOMRA) 15 MG TABS Take 15 mg by mouth at bedtime as needed.   No facility-administered encounter medications on file as of 07/11/2021.     Contacted Martha Mata on 07/11/21 for general disease state and medication adherence call.   Patient is not > 5 days past due for refill on the following medications per chart history:  [facility] Medications: Medication Name/mg Last Fill Days Supply Rosuvastatin 20 mg  05/27/21  90   What concerns do you have about your medications?  The patient denies side effects with her medications.   How often do you forget or accidentally miss a dose? Never, patient is given medication by caregiver  Do you use a pillbox? Yes, medications come already packaged  Are you having any problems getting your medications from your pharmacy? No  Has the cost of your medications been a concern? No If yes, what medication and is patient assistance available or has it been applied for?  Since last visit with CPP, no interventions have been made:   The patient has had an ED visit since last contact.   The patient reports the following problems with their health. Son Martha Mata stated that his mother had trouble falling to sleep and was given a prescription for sleep (Belsomra). He states that the medication works fine and patient is able to rest at night  she denies  concerns or questions for [employer], Pharm. D at this time.   Counseled patient on:  Great job taking medications   Care Gaps: Last annual wellness visit: If Diabetic: Last eye exam / retinopathy screening: Last diabetic foot exam:  No appointments scheduled within the next 30 days.   [place] Pharmacist Assistant [phone]  Time Spent:20

## 2021-07-21 ENCOUNTER — Ambulatory Visit (INDEPENDENT_AMBULATORY_CARE_PROVIDER_SITE_OTHER): Payer: Medicare Other | Admitting: *Deleted

## 2021-07-21 DIAGNOSIS — Z0000 Encounter for general adult medical examination without abnormal findings: Secondary | ICD-10-CM

## 2021-07-21 NOTE — Patient Instructions (Signed)
Health Maintenance, Female Adopting a healthy lifestyle and getting preventive care are important in promoting health and wellness. Ask your health care provider about: The right schedule for you to have regular tests and exams. Things you can do on your own to prevent diseases and keep yourself healthy. What should I know about diet, weight, and exercise? Eat a healthy diet  Eat a diet that includes plenty of vegetables, fruits, low-fat dairy products, and lean protein. Do not eat a lot of foods that are high in solid fats, added sugars, or sodium. Maintain a healthy weight Body mass index (BMI) is used to identify weight problems. It estimates body fat based on height and weight. Your health care provider can help determine your BMI and help you achieve or maintain a healthy weight. Get regular exercise Get regular exercise. This is one of the most important things you can do for your health. Most adults should: Exercise for at least 150 minutes each week. The exercise should increase your heart rate and make you sweat (moderate-intensity exercise). Do strengthening exercises at least twice a week. This is in addition to the moderate-intensity exercise. Spend less time sitting. Even light physical activity can be beneficial. Watch cholesterol and blood lipids Have your blood tested for lipids and cholesterol at 81 years of age, then have this test every 5 years. Have your cholesterol levels checked more often if: Your lipid or cholesterol levels are high. You are older than 81 years of age. You are at high risk for heart disease. What should I know about cancer screening? Depending on your health history and family history, you may need to have cancer screening at various ages. This may include screening for: Breast cancer. Cervical cancer. Colorectal cancer. Skin cancer. Lung cancer. What should I know about heart disease, diabetes, and high blood pressure? Blood pressure and heart  disease High blood pressure causes heart disease and increases the risk of stroke. This is more likely to develop in people who have high blood pressure readings, are of African descent, or are overweight. Have your blood pressure checked: Every 3-5 years if you are 18-39 years of age. Every year if you are 40 years old or older. Diabetes Have regular diabetes screenings. This checks your fasting blood sugar level. Have the screening done: Once every three years after age 40 if you are at a normal weight and have a low risk for diabetes. More often and at a younger age if you are overweight or have a high risk for diabetes. What should I know about preventing infection? Hepatitis B If you have a higher risk for hepatitis B, you should be screened for this virus. Talk with your health care provider to find out if you are at risk for hepatitis B infection. Hepatitis C Testing is recommended for: Everyone born from 1945 through 1965. Anyone with known risk factors for hepatitis C. Sexually transmitted infections (STIs) Get screened for STIs, including gonorrhea and chlamydia, if: You are sexually active and are younger than 81 years of age. You are older than 81 years of age and your health care provider tells you that you are at risk for this type of infection. Your sexual activity has changed since you were last screened, and you are at increased risk for chlamydia or gonorrhea. Ask your health care provider if you are at risk. Ask your health care provider about whether you are at high risk for HIV. Your health care provider may recommend a prescription medicine   to help prevent HIV infection. If you choose to take medicine to prevent HIV, you should first get tested for HIV. You should then be tested every 3 months for as long as you are taking the medicine. Pregnancy If you are about to stop having your period (premenopausal) and you may become pregnant, seek counseling before you get  pregnant. Take 400 to 800 micrograms (mcg) of folic acid every day if you become pregnant. Ask for birth control (contraception) if you want to prevent pregnancy. Osteoporosis and menopause Osteoporosis is a disease in which the bones lose minerals and strength with aging. This can result in bone fractures. If you are 65 years old or older, or if you are at risk for osteoporosis and fractures, ask your health care provider if you should: Be screened for bone loss. Take a calcium or vitamin D supplement to lower your risk of fractures. Be given hormone replacement therapy (HRT) to treat symptoms of menopause. Follow these instructions at home: Lifestyle Do not use any products that contain nicotine or tobacco, such as cigarettes, e-cigarettes, and chewing tobacco. If you need help quitting, ask your health care provider. Do not use street drugs. Do not share needles. Ask your health care provider for help if you need support or information about quitting drugs. Alcohol use Do not drink alcohol if: Your health care provider tells you not to drink. You are pregnant, may be pregnant, or are planning to become pregnant. If you drink alcohol: Limit how much you use to 0-1 drink a day. Limit intake if you are breastfeeding. Be aware of how much alcohol is in your drink. In the [place] one drink equals one 12 oz bottle of beer (355 mL), one 5 oz glass of wine (148 mL), or one 1 oz glass of hard liquor (44 mL). General instructions Schedule regular health, dental, and eye exams. Stay current with your vaccines. Tell your health care provider if: You often feel depressed. You have ever been abused or do not feel safe at home. Summary Adopting a healthy lifestyle and getting preventive care are important in promoting health and wellness. Follow your health care provider's instructions about healthy diet, exercising, and getting tested or screened for diseases. Follow your health care provider's  instructions on monitoring your cholesterol and blood pressure. This information is not intended to replace advice given to you by your health care provider. Make sure you discuss any questions you have with your health care provider. Document Revised: 01/13/2021 Document Reviewed: 10/29/2018 Elsevier Patient Education  2022 Elsevier Inc.  

## 2021-07-21 NOTE — Progress Notes (Signed)
Subjective:   Martha Mata is a 80 y.o. female who presents for Medicare Annual (Subsequent) preventive examination.  I connected with  Temaka Osu Gren on 07/21/21 by audio enabled telemedicine application and verified that I am speaking with the correct person using two identifiers.   I discussed the limitations of evaluation and management by telemedicine. The patient expressed understanding and agreed to proceed.   Location of patient: Home Location of Provider: Office Person participating in visit: Thomasena (patient), [name] (son & POA)  & Jari Favre, CMA  Review of Systems    Defer to PCP Cardiac Risk Factors include: none     Objective:    There were no vitals filed for this visit. There is no height or weight on file to calculate BMI.  Advanced Directives 07/21/2021 08/22/2017 08/14/2013 07/22/2013  Does Patient Have a Medical Advance Directive? Yes No Patient does not have advance directive Patient does not have advance directive  Type of Advance Directive [place]  Does patient want to make changes to medical advance directive? Yes (ED - Information included in AVS) - - -  Copy of [place] in Chart? Yes - validated most recent copy scanned in chart (See row information) - - -  Would patient like information on creating a medical advance directive? - No - Patient declined - -  Pre-existing out of facility DNR order (yellow form or pink MOST form) - - No -    Current Medications (verified) Outpatient Encounter Medications as of 07/21/2021  Medication Sig   amLODipine (NORVASC) 5 MG tablet TAKE ONE TABLET BY MOUTH EVERY MORNING   Cholecalciferol (VITAMIN D3) 50 MCG (2000 UT) CAPS Take by mouth. Take once a day   cyanocobalamin 500 MCG tablet Take 500 mcg by mouth daily.   donepezil (ARICEPT) 10 MG tablet Take 1 tablet (10 mg total) by mouth daily.   Finerenone (KERENDIA) 20 MG TABS Take 1 tablet by mouth daily.   furosemide  (LASIX) 20 MG tablet TAKE ONE TABLET BY MOUTH ONCE DAILY   memantine (NAMENDA) 10 MG tablet Take 1 tablet (10 mg total) by mouth 2 (two) times daily.   metoprolol tartrate (LOPRESSOR) 100 MG tablet TAKE ONE TABLET BY MOUTH EVERY MORNING and TAKE ONE TABLET BY MOUTH EVERYDAY AT BEDTIME   potassium chloride SA (KLOR-CON) 20 MEQ tablet TAKE ONE TABLET BY MOUTH EVERY MORNING   rosuvastatin (CRESTOR) 20 MG tablet TAKE ONE TABLET BY MOUTH EVERYDAY AT BEDTIME   Suvorexant (BELSOMRA) 15 MG TABS Take 15 mg by mouth at bedtime as needed.   No facility-administered encounter medications on file as of 07/21/2021.    Allergies (verified) Farxiga [dapagliflozin]   History: Past Medical History:  Diagnosis Date   Fibroid, uterine    GERD (gastroesophageal reflux disease)    no meds   Hypertension    Memory loss of unknown cause    per pt's admission   Vitamin D deficiency disease    Past Surgical History:  Procedure Laterality Date   CERVIX LESION DESTRUCTION     COLONOSCOPY  2005   DILATATION & CURRETTAGE/HYSTEROSCOPY WITH RESECTOCOPE N/A 08/14/2013   Procedure: DILATATION & CURETTAGE/HYSTEROSCOPY WITH RESECTOCOPE;  Surgeon: Marvene Staff, MD;  Location: [facility];  Service: Gynecology;  Laterality: N/A;   DILATION AND CURETTAGE OF UTERUS     HYSTEROSCOPY     KNEE ARTHROCENTESIS  2010   TUBAL LIGATION     UTERINE FIBROID SURGERY  2005   Family History  Problem Relation Age of Onset   Diabetes Son    Social History   Socioeconomic History   Marital status: Widowed    Spouse name: Not on file   Number of children: 2   Years of education: 12+   Highest education level: Not on file  Occupational History   Occupation: Retired  Tobacco Use   Smoking status: Never   Smokeless tobacco: Never  Vaping Use   Vaping Use: Never used  Substance and Sexual Activity   Alcohol use: Never    Comment: occasionally,one glass of wine monthly   Drug use: No   Sexual activity: Not Currently   Other Topics Concern   Not on file  Social History Narrative   Patient is retired.    Patient is a non-smoker    Patient lives at home alone. Has a caretaker who comes in to help as needed.   Patient consumes tea and coffee (1-2 cups daily)   Patient is right handed.   Patient has a college education.   Patient has two children.   Social Determinants of Health   Financial Resource Strain: Not on file  Food Insecurity: Not on file  Transportation Needs: Not on file  Physical Activity: Not on file  Stress: Not on file  Social Connections: Not on file    Tobacco Counseling Counseling given: Not Answered   Clinical Intake:  Pre-visit preparation completed: Yes  Pain : No/denies pain        How often do you need to have someone help you when you read instructions, pamphlets, or other written materials from your doctor or pharmacy?: 5 - Always  Diabetic? No  Interpreter Needed?: No      Activities of Daily Living In your present state of health, do you have any difficulty performing the following activities: 07/21/2021 03/23/2021  Hearing? Y N  Vision? N N  Difficulty concentrating or making decisions? Y N  Walking or climbing stairs? N N  Dressing or bathing? Y N  Doing errands, shopping? Y N  Preparing Food and eating ? Y -  Using the Toilet? Y -  In the past six months, have you accidently leaked urine? Y -  Do you have problems with loss of bowel control? N -  Managing your Medications? Y -  Managing your Finances? Y -  Housekeeping or managing your Housekeeping? Y -  Some recent data might be hidden    Patient Care Team: Janith Lima, MD as PCP - General (Internal Medicine) [name], [facility] as Pharmacist (Pharmacist)  Indicate any recent Medical Services you may have received from other than [facility] providers in the past year (date may be approximate).     Assessment:   This is a routine wellness examination for Martha Mata.  Hearing/Vision  screen No results found.  Dietary issues and exercise activities discussed: Current Exercise Habits: Structured exercise class, Frequency (Times/Week): 1, Intensity: Mild   Goals Addressed   None    Depression Screen PHQ 2/9 Scores 07/21/2021 02/07/2021 01/28/2020 04/25/2018  PHQ - 2 Score 0 0 0 0    Fall Risk Fall Risk  07/21/2021 02/07/2021 08/26/2019 04/25/2018  Falls in the past year? 0 0 0 No  Number falls in past yr: 0 - 0 -  Injury with Fall? 0 - 0 -  Follow up - - Falls evaluation completed -    FALL RISK PREVENTION PERTAINING TO THE HOME:  Any stairs in  or around the home? Yes  If so, are there any without handrails? Yes  Home free of loose throw rugs in walkways, pet beds, electrical cords, etc? Yes  Adequate lighting in your home to reduce risk of falls? Yes   ASSISTIVE DEVICES UTILIZED TO PREVENT FALLS:  Life alert? Yes  Use of a cane, walker or w/c? No  Grab bars in the bathroom? Yes  Shower chair or bench in shower? No  Elevated toilet seat or a handicapped toilet? Yes   TIMED UP AND GO:  Was the test performed? No .   Cognitive Function: MMSE - Mini Mental State Exam 11/30/2020 11/26/2018 05/27/2017 11/26/2016 05/17/2016  Not completed: Unable to complete (No Data) - - -  Orientation to time - 0 0 3 0  Orientation to Place - 0 '2 2 2  '$ Registration - '3 3 3 3  '$ Attention/ Calculation - 0 0 0 0  Recall - 0 1 0 0  Language- name 2 objects - '1 2 2 2  '$ Language- repeat - 1 0 1 1  Language- follow 3 step command - '2 2 3 3  '$ Language- read & follow direction - 0 '1 1 1  '$ Write a sentence - 0 0 1 0  Copy design - 0 0 0 0  Total score - '7 11 16 12        '$ Immunizations Immunization History  Administered Date(s) Administered   Fluad Quad(high Dose 65+) 08/26/2019   Influenza, High Dose Seasonal PF 08/06/2018   Influenza-Unspecified 08/19/2020   PFIZER(Purple Top)SARS-COV-2 Vaccination 12/01/2019, 12/31/2019   Pneumococcal Conjugate-13 04/25/2018   Pneumococcal  Polysaccharide-23 04/27/2019   Tdap 04/25/2018   Zoster Recombinat (Shingrix) 01/31/2017, 04/04/2017    TDAP status: Up to date  Flu Vaccine status: Due, Education has been provided regarding the importance of this vaccine. Advised may receive this vaccine at local pharmacy or Health Dept. Aware to provide a copy of the vaccination record if obtained from local pharmacy or Health Dept. Verbalized acceptance and understanding.  Pneumococcal vaccine status: Up to date  Covid-19 vaccine status: Information provided on how to obtain vaccines.   Qualifies for Shingles Vaccine? Yes   Zostavax completed Yes   Shingrix Completed?: Yes  Screening Tests Health Maintenance  Topic Date Due   OPHTHALMOLOGY EXAM  Never done   DEXA SCAN  Never done   COVID-19 Vaccine (3 - Booster for Pfizer series) 05/29/2020   INFLUENZA VACCINE  06/19/2021   HEMOGLOBIN A1C  08/10/2021   FOOT EXAM  02/07/2022   URINE MICROALBUMIN  02/07/2022   TETANUS/TDAP  04/25/2028   PNA vac Low Risk Adult  Completed   Zoster Vaccines- Shingrix  Completed   HPV VACCINES  Aged Out    Health Maintenance  Health Maintenance Due  Topic Date Due   OPHTHALMOLOGY EXAM  Never done   DEXA SCAN  Never done   COVID-19 Vaccine (3 - Booster for Pfizer series) 05/29/2020   INFLUENZA VACCINE  06/19/2021    Colorectal cancer screening: No longer required.   Mammogram status: No longer required due to age.    Lung Cancer Screening: (Low Dose CT Chest recommended if Age 15-80 years, 30 pack-year currently smoking OR have quit w/in 15years.) does not qualify.     Additional Screening:  Hepatitis C Screening: does not qualify; not completed  Vision Screening: Recommended annual ophthalmology exams for early detection of glaucoma and other disorders of the eye. Is the patient up to date with their annual  eye exam?  Yes  Who is the provider or what is the name of the office in which the patient attends annual eye exams? [facility] [place] If pt is not established with a provider, would they like to be referred to a provider to establish care? No .   Dental Screening: Recommended annual dental exams for proper oral hygiene  Community Resource Referral / Chronic Care Management: CRR required this visit?  No   CCM required this visit?  No      Plan:     I have personally reviewed and noted the following in the patient's chart:   Medical and social history Use of alcohol, tobacco or illicit drugs  Current medications and supplements including opioid prescriptions.  Functional ability and status Nutritional status Physical activity Advanced directives List of other physicians Hospitalizations, surgeries, and ER visits in previous 12 months Vitals Screenings to include cognitive, depression, and falls Referrals and appointments  In addition, I have reviewed and discussed with patient certain preventive protocols, quality metrics, and best practice recommendations. A written personalized care plan for preventive services as well as general preventive health recommendations were provided to patient.     Cannon Kettle, Gary   07/21/2021   Nurse Notes: 17 minutes of non face to face

## 2021-08-23 ENCOUNTER — Telehealth: Payer: Self-pay | Admitting: Pharmacist

## 2021-08-23 NOTE — Progress Notes (Signed)
    Chronic Care Management Pharmacy Assistant   Name: Martha Mata  MRN: [id number] DOB: 1941-03-08   Reason for Encounter: Medication Review     Medications: Outpatient Encounter Medications as of 08/23/2021  Medication Sig   amLODipine (NORVASC) 5 MG tablet TAKE ONE TABLET BY MOUTH EVERY MORNING   Cholecalciferol (VITAMIN D3) 50 MCG (2000 UT) CAPS Take by mouth. Take once a day   cyanocobalamin 500 MCG tablet Take 500 mcg by mouth daily.   donepezil (ARICEPT) 10 MG tablet Take 1 tablet (10 mg total) by mouth daily.   Finerenone (KERENDIA) 20 MG TABS Take 1 tablet by mouth daily.   furosemide (LASIX) 20 MG tablet TAKE ONE TABLET BY MOUTH ONCE DAILY   memantine (NAMENDA) 10 MG tablet Take 1 tablet (10 mg total) by mouth 2 (two) times daily.   metoprolol tartrate (LOPRESSOR) 100 MG tablet TAKE ONE TABLET BY MOUTH EVERY MORNING and TAKE ONE TABLET BY MOUTH EVERYDAY AT BEDTIME   potassium chloride SA (KLOR-CON) 20 MEQ tablet TAKE ONE TABLET BY MOUTH EVERY MORNING   rosuvastatin (CRESTOR) 20 MG tablet TAKE ONE TABLET BY MOUTH EVERYDAY AT BEDTIME   Suvorexant (BELSOMRA) 15 MG TABS Take 15 mg by mouth at bedtime as needed.   No facility-administered encounter medications on file as of 08/23/2021.   BP Readings from Last 3 Encounters:  05/25/21 (!) 142/76  05/03/21 140/80  04/11/21 130/78    Lab Results  Component Value Date   HGBA1C 7.9 (H) 02/07/2021      Last adherence delivery date:06/02/21      Patient is due for next adherence delivery on: 09/04/21  Spoke with patient on 08/23/21 reviewed medications and coordinated delivery.Spoke with care giver [name]  This delivery to include: Adherence Packaging  90 Days  amLODipine (NORVASC) 5 MG Take 1 Tab every  morning furosemide (LASIX) 20 MG tablet Take one tab every morning Metoprolol tartrate (Lopressor) 100 mg Take 1tab every morning and bedtime memantine (NAMENDA) 10 MG tablet Take 1 tab at breakfast and bedtime Potassium  chloride SA (KLOR-CON) 20 MEG Take 1 tab every morning rosuvastatin (Crestor) 20 mg tab Take 1 tab everyday bedtime Donepezil 10 mg Take 1 tab at bedtime Belsomra 15 mg 1 tab at bedtime  Patient declined the following medications this month: Patient care giver states to hold off on Belsomra at this time   Any concerns about your medications? Yes, Caregiver wants to know if there is any patient assistance for [name] and Belsomra because they are to expensive   How often do you forget or accidentally miss a dose? Never  Do you use a pillbox? No  Is patient in packaging Yes  If yes  What is the date on your next pill pack?Not at the home right   now to check  Any concerns or issues with your packaging?No   No refill request needed.  Confirmed delivery date of 09/04/21, advised patient that pharmacy will contact them the morning of delivery.  Recent blood pressure readings are as follows:None  Annual wellness visit in last year? No Most Recent BP reading:12/79, 05/25/21  Ethelene Hal Clinical Pharmacist Assistant [phone]

## 2021-09-21 ENCOUNTER — Telehealth: Payer: Self-pay | Admitting: Pharmacist

## 2021-09-21 NOTE — Telephone Encounter (Signed)
Patient has [employer] and reports copay for [name] is cost prohibitive at this time.  Received patient assistance paperwork from assistant. Printed and mailed patient section to patient per her request. Patient instructed to sign application and provide proof of income, then either mail directly to [place] or bring forms to office for [place] to fax.  Prescriber section completed and faxed to [place].  [name], [facility]

## 2021-09-21 NOTE — Progress Notes (Signed)
    Chronic Care Management Pharmacy Assistant   Name: Martha Mata  MRN: [id number] DOB: [birth year]    Medications: Outpatient Encounter Medications as of 09/21/2021  Medication Sig   amLODipine (NORVASC) 5 MG tablet TAKE ONE TABLET BY MOUTH EVERY MORNING   Cholecalciferol (VITAMIN D3) 50 MCG (2000 UT) CAPS Take by mouth. Take once a day   cyanocobalamin 500 MCG tablet Take 500 mcg by mouth daily.   donepezil (ARICEPT) 10 MG tablet Take 1 tablet (10 mg total) by mouth daily.   Finerenone (KERENDIA) 20 MG TABS Take 1 tablet by mouth daily.   furosemide (LASIX) 20 MG tablet TAKE ONE TABLET BY MOUTH ONCE DAILY   memantine (NAMENDA) 10 MG tablet Take 1 tablet (10 mg total) by mouth 2 (two) times daily.   metoprolol tartrate (LOPRESSOR) 100 MG tablet TAKE ONE TABLET BY MOUTH EVERY MORNING and TAKE ONE TABLET BY MOUTH EVERYDAY AT BEDTIME   potassium chloride SA (KLOR-CON) 20 MEQ tablet TAKE ONE TABLET BY MOUTH EVERY MORNING   rosuvastatin (CRESTOR) 20 MG tablet TAKE ONE TABLET BY MOUTH EVERYDAY AT BEDTIME   Suvorexant (BELSOMRA) 15 MG TABS Take 15 mg by mouth at bedtime as needed.   No facility-administered encounter medications on file as of 09/21/2021.    Pharmacist Review    Filled out application for [employer] [place] [employer] for [place]. Also called patient and spoke with son [name]) who said to mail application to mother's address.  [place] Pharmacist Assistant [phone]

## 2021-10-02 ENCOUNTER — Encounter: Payer: Self-pay | Admitting: Internal Medicine

## 2021-10-10 ENCOUNTER — Other Ambulatory Visit: Payer: Self-pay | Admitting: Internal Medicine

## 2021-10-10 DIAGNOSIS — E118 Type 2 diabetes mellitus with unspecified complications: Secondary | ICD-10-CM

## 2021-10-10 DIAGNOSIS — N1831 Chronic kidney disease, stage 3a: Secondary | ICD-10-CM

## 2021-10-26 ENCOUNTER — Telehealth: Payer: Self-pay

## 2021-10-26 NOTE — Telephone Encounter (Signed)
-----   Message from [name], [facility] sent at 10/20/2021 11:42 AM EST ----- Patient's son Gardener left message requesting fax number to send completed [place] application. Please return his call: [phone] or [phone].

## 2021-10-26 NOTE — Telephone Encounter (Signed)
Left message with fax number of [phone] for application to be sent to   Patient/ grandson to call office with any questions

## 2021-10-28 ENCOUNTER — Observation Stay (HOSPITAL_COMMUNITY)
Admission: EM | Admit: 2021-10-28 | Discharge: 2021-10-29 | Disposition: A | Discharge status: Home Health | Payer: Medicare Other | Attending: Internal Medicine | Admitting: Internal Medicine

## 2021-10-28 ENCOUNTER — Emergency Department (HOSPITAL_COMMUNITY): Payer: Medicare Other

## 2021-10-28 ENCOUNTER — Other Ambulatory Visit: Payer: Self-pay

## 2021-10-28 DIAGNOSIS — R4182 Altered mental status, unspecified: Secondary | ICD-10-CM | POA: Diagnosis present

## 2021-10-28 DIAGNOSIS — E860 Dehydration: Secondary | ICD-10-CM | POA: Insufficient documentation

## 2021-10-28 DIAGNOSIS — R0600 Dyspnea, unspecified: Secondary | ICD-10-CM | POA: Diagnosis not present

## 2021-10-28 DIAGNOSIS — Z79899 Other long term (current) drug therapy: Secondary | ICD-10-CM | POA: Insufficient documentation

## 2021-10-28 DIAGNOSIS — E119 Type 2 diabetes mellitus without complications: Secondary | ICD-10-CM | POA: Diagnosis not present

## 2021-10-28 DIAGNOSIS — Z9181 History of falling: Secondary | ICD-10-CM | POA: Insufficient documentation

## 2021-10-28 DIAGNOSIS — U071 COVID-19: Secondary | ICD-10-CM | POA: Insufficient documentation

## 2021-10-28 DIAGNOSIS — N179 Acute kidney failure, unspecified: Secondary | ICD-10-CM | POA: Diagnosis not present

## 2021-10-28 DIAGNOSIS — F0390 Unspecified dementia without behavioral disturbance: Secondary | ICD-10-CM | POA: Insufficient documentation

## 2021-10-28 DIAGNOSIS — K449 Diaphragmatic hernia without obstruction or gangrene: Secondary | ICD-10-CM | POA: Diagnosis not present

## 2021-10-28 DIAGNOSIS — G9341 Metabolic encephalopathy: Secondary | ICD-10-CM | POA: Diagnosis not present

## 2021-10-28 DIAGNOSIS — I10 Essential (primary) hypertension: Secondary | ICD-10-CM | POA: Diagnosis not present

## 2021-10-28 DIAGNOSIS — M6281 Muscle weakness (generalized): Secondary | ICD-10-CM | POA: Insufficient documentation

## 2021-10-28 DIAGNOSIS — R0902 Hypoxemia: Secondary | ICD-10-CM | POA: Diagnosis not present

## 2021-10-28 DIAGNOSIS — R739 Hyperglycemia, unspecified: Secondary | ICD-10-CM | POA: Diagnosis not present

## 2021-10-28 DIAGNOSIS — R404 Transient alteration of awareness: Secondary | ICD-10-CM | POA: Diagnosis not present

## 2021-10-28 DIAGNOSIS — I959 Hypotension, unspecified: Secondary | ICD-10-CM | POA: Diagnosis not present

## 2021-10-28 LAB — CBC WITH DIFFERENTIAL/PLATELET
Abs Immature Granulocytes: 0.03 10*3/uL (ref 0.00–0.07)
Basophils Absolute: 0 10*3/uL (ref 0.0–0.1)
Basophils Relative: 0 %
Eosinophils Absolute: 0 10*3/uL (ref 0.0–0.5)
Eosinophils Relative: 0 %
HCT: 39.6 % (ref 36.0–46.0)
Hemoglobin: 12.6 g/dL (ref 12.0–15.0)
Immature Granulocytes: 0 %
Lymphocytes Relative: 10 %
Lymphs Abs: 0.9 10*3/uL (ref 0.7–4.0)
MCH: 31.1 pg (ref 26.0–34.0)
MCHC: 31.8 g/dL (ref 30.0–36.0)
MCV: 97.8 fL (ref 80.0–100.0)
Monocytes Absolute: 1.3 10*3/uL — ABNORMAL HIGH (ref 0.1–1.0)
Monocytes Relative: 15 %
Neutro Abs: 6.5 10*3/uL (ref 1.7–7.7)
Neutrophils Relative %: 75 %
Platelets: 255 10*3/uL (ref 150–400)
RBC: 4.05 MIL/uL (ref 3.87–5.11)
RDW: 13.2 % (ref 11.5–15.5)
WBC: 8.7 10*3/uL (ref 4.0–10.5)
nRBC: 0 % (ref 0.0–0.2)

## 2021-10-28 LAB — COMPREHENSIVE METABOLIC PANEL
ALT: 22 U/L (ref 0–44)
AST: 50 U/L — ABNORMAL HIGH (ref 15–41)
Albumin: 3.5 g/dL (ref 3.5–5.0)
Alkaline Phosphatase: 65 U/L (ref 38–126)
Anion gap: 9 (ref 5–15)
BUN: 30 mg/dL — ABNORMAL HIGH (ref 8–23)
CO2: 27 mmol/L (ref 22–32)
Calcium: 9.4 mg/dL (ref 8.9–10.3)
Chloride: 102 mmol/L (ref 98–111)
Creatinine, Ser: 1.53 mg/dL — ABNORMAL HIGH (ref 0.44–1.00)
GFR, Estimated: 34 mL/min — ABNORMAL LOW (ref >60)
Glucose, Bld: 219 mg/dL — ABNORMAL HIGH (ref 70–99)
Potassium: 5 mmol/L (ref 3.5–5.1)
Sodium: 138 mmol/L (ref 135–145)
Total Bilirubin: 1 mg/dL (ref 0.3–1.2)
Total Protein: 8.1 g/dL (ref 6.5–8.1)

## 2021-10-28 LAB — CBG MONITORING, ED: Glucose-Capillary: 186 mg/dL — ABNORMAL HIGH (ref 70–99)

## 2021-10-28 LAB — RESP PANEL BY RT-PCR (FLU A&B, COVID) ARPGX2
Influenza A by PCR: NEGATIVE
Influenza B by PCR: NEGATIVE
SARS Coronavirus 2 by RT PCR: POSITIVE — AB

## 2021-10-28 LAB — GLUCOSE, CAPILLARY: Glucose-Capillary: 131 mg/dL — ABNORMAL HIGH (ref 70–99)

## 2021-10-28 MED ORDER — ACETAMINOPHEN 325 MG PO TABS: 650.0000 mg | ORAL_TABLET | Freq: Four times a day (QID) | ORAL | Status: DC | PRN

## 2021-10-28 MED ORDER — SODIUM CHLORIDE 0.9 % IV SOLN: INTRAVENOUS | Status: DC

## 2021-10-28 MED ORDER — ONDANSETRON HCL 4 MG PO TABS: 4.0000 mg | ORAL_TABLET | Freq: Four times a day (QID) | ORAL | Status: DC | PRN

## 2021-10-28 MED ORDER — GUAIFENESIN-DM 100-10 MG/5ML PO SYRP: 10.0000 mL | ORAL_SOLUTION | ORAL | Status: DC | PRN

## 2021-10-28 MED ORDER — SODIUM CHLORIDE 0.9 % IV BOLUS
1000.0000 mL | Freq: Once | INTRAVENOUS | Status: AC
  Administered 2021-10-28: 1000 mL via INTRAVENOUS

## 2021-10-28 MED ORDER — MEMANTINE HCL 10 MG PO TABS
10.0000 mg | ORAL_TABLET | Freq: Two times a day (BID) | ORAL | Status: DC
  Administered 2021-10-28 – 2021-10-29 (×2): 10 mg via ORAL
  Filled 2021-10-28 (×2): qty 1

## 2021-10-28 MED ORDER — INSULIN ASPART 100 UNIT/ML IJ SOLN: 0.0000 [IU] | Freq: Three times a day (TID) | INTRAMUSCULAR | Status: DC

## 2021-10-28 MED ORDER — HYDROCOD POLST-CPM POLST ER 10-8 MG/5ML PO SUER: 5.0000 mL | Freq: Two times a day (BID) | ORAL | Status: DC | PRN

## 2021-10-28 MED ORDER — DONEPEZIL HCL 10 MG PO TABS
10.0000 mg | ORAL_TABLET | Freq: Every day | ORAL | Status: DC
  Administered 2021-10-28 – 2021-10-29 (×2): 10 mg via ORAL
  Filled 2021-10-28 (×2): qty 1

## 2021-10-28 MED ORDER — ASCORBIC ACID 500 MG PO TABS
500.0000 mg | ORAL_TABLET | Freq: Every day | ORAL | Status: DC
  Administered 2021-10-28 – 2021-10-29 (×2): 500 mg via ORAL
  Filled 2021-10-28 (×2): qty 1

## 2021-10-28 MED ORDER — ONDANSETRON HCL 4 MG/2ML IJ SOLN: 4.0000 mg | Freq: Four times a day (QID) | INTRAMUSCULAR | Status: DC | PRN

## 2021-10-28 MED ORDER — ACETAMINOPHEN 650 MG RE SUPP: 650.0000 mg | Freq: Four times a day (QID) | RECTAL | Status: DC | PRN

## 2021-10-28 MED ORDER — ZINC SULFATE 220 (50 ZN) MG PO CAPS
220.0000 mg | ORAL_CAPSULE | Freq: Every day | ORAL | Status: DC
  Administered 2021-10-28 – 2021-10-29 (×2): 220 mg via ORAL
  Filled 2021-10-28 (×2): qty 1

## 2021-10-28 MED ORDER — ENOXAPARIN SODIUM 40 MG/0.4ML IJ SOSY
40.0000 mg | PREFILLED_SYRINGE | INTRAMUSCULAR | Status: DC
  Administered 2021-10-28: 40 mg via SUBCUTANEOUS
  Filled 2021-10-28: qty 0.4

## 2021-10-28 MED ORDER — ROSUVASTATIN CALCIUM 10 MG PO TABS
20.0000 mg | ORAL_TABLET | Freq: Every day | ORAL | Status: DC
  Administered 2021-10-29: 20 mg via ORAL
  Filled 2021-10-28: qty 2

## 2021-10-28 NOTE — ED Notes (Signed)
Family at bedside. 

## 2021-10-28 NOTE — ED Triage Notes (Addendum)
Patient bib [facility], patient has dementia, family reported that patient has worsening confusion today - stated patient was not answering questions appropriately. No other complaints. Patient denies pain in triage but is not oriented to person/place/time.  Patient will attempt to answer questions, but does not answer appropriately.

## 2021-10-28 NOTE — ED Notes (Signed)
ED TO INPATIENT HANDOFF REPORT  Name/Age/Gender Martha Mata 80 y.o. female  Code Status   Home/SNF/Other Home  Chief Complaint AKI (acute kidney injury) ([place]) [N17.9]  Level of Care/Admitting Diagnosis ED Disposition     ED Disposition  Admit   Condition  --   Comment  Hospital Area: [facility] [[id number]]  Level of Care: Telemetry [5]  Admit to tele based on following criteria: Monitor for Ischemic changes  May place patient in observation at [facility] or [place] if equivalent level of care is available:: No  Covid Evaluation: Confirmed COVID Positive  Diagnosis: AKI (acute kidney injury) [facility]) [[id number]]  Admitting Physician: Jonnie Finner [[id number]]  Attending Physician: Jonnie Finner [[id number]]          Medical History Past Medical History:  Diagnosis Date   Fibroid, uterine    GERD (gastroesophageal reflux disease)    no meds   Hypertension    Memory loss of unknown cause    per pt's admission   Vitamin D deficiency disease     Allergies Allergies  Allergen Reactions   Farxiga [Dapagliflozin] Diarrhea    rash    IV Location/Drains/Wounds Patient Lines/Drains/Airways Status     Active Line/Drains/Airways     Name Placement date Placement time Site Days   Peripheral IV 10/28/21 Right Antecubital 10/28/21  1359  Antecubital  less than 1            Labs/Imaging Results for orders placed or performed during the hospital encounter of 10/28/21 (from the past 48 hour(s))  Resp Panel by RT-PCR (Flu A&B, Covid) Nasopharyngeal Swab     Status: Abnormal   Collection Time: 10/28/21  1:09 PM   Specimen: Nasopharyngeal Swab; Nasopharyngeal(NP) swabs in vial transport medium  Result Value Ref Range   SARS Coronavirus 2 by RT PCR POSITIVE (A) NEGATIVE    Comment: RESULT CALLED TO, READ BACK BY AND VERIFIED WITH: BRATU,D EMTP ON 10/28/21 AT 1455 BY GOLSONM (NOTE) SARS-CoV-2 target nucleic acids are DETECTED.  The  SARS-CoV-2 RNA is generally detectable in upper respiratory specimens during the acute phase of infection. Positive results are indicative of the presence of the identified virus, but do not rule out bacterial infection or co-infection with other pathogens not detected by the test. Clinical correlation with patient history and other diagnostic information is necessary to determine patient infection status. The expected result is Negative.  Fact Sheet for Patients: EntrepreneurPulse.com.au  Fact Sheet for Healthcare Providers: IncredibleEmployment.be  This test is not yet approved or cleared by the [place] FDA and  has been authorized for detection and/or diagnosis of SARS-CoV-2 by FDA under an Emergency Use Authorization (EUA).  This EUA will remain in effect (meaning this t est can be used) for the duration of  the COVID-19 declaration under Section 564(b)(1) of the Act, 21 U.S.C. section 360bbb-3(b)(1), unless the authorization is terminated or revoked sooner.     Influenza A by PCR NEGATIVE NEGATIVE   Influenza B by PCR NEGATIVE NEGATIVE    Comment: (NOTE) The Xpert Xpress SARS-CoV-2/FLU/RSV plus assay is intended as an aid in the diagnosis of influenza from Nasopharyngeal swab specimens and should not be used as a sole basis for treatment. Nasal washings and aspirates are unacceptable for Xpert Xpress SARS-CoV-2/FLU/RSV testing.  Fact Sheet for Patients: EntrepreneurPulse.com.au  Fact Sheet for Healthcare Providers: IncredibleEmployment.be  This test is not yet approved or cleared by the [place] FDA and has been authorized for detection  and/or diagnosis of SARS-CoV-2 by FDA under an Emergency Use Authorization (EUA). This EUA will remain in effect (meaning this test can be used) for the duration of the COVID-19 declaration under Section 564(b)(1) of the Act, 21 U.S.C. section  360bbb-3(b)(1), unless the authorization is terminated or revoked.  Performed at [facility], [place] [address], [place], [place] [address]   CBC with Differential     Status: Abnormal   Collection Time: 10/28/21  1:09 PM  Result Value Ref Range   WBC 8.7 4.0 - 10.5 K/uL   RBC 4.05 3.87 - 5.11 MIL/uL   Hemoglobin 12.6 12.0 - 15.0 g/dL   HCT 39.6 36.0 - 46.0 %   MCV 97.8 80.0 - 100.0 fL   MCH 31.1 26.0 - 34.0 pg   MCHC 31.8 30.0 - 36.0 g/dL   RDW 13.2 11.5 - 15.5 %   Platelets 255 150 - 400 K/uL   nRBC 0.0 0.0 - 0.2 %   Neutrophils Relative % 75 %   Neutro Abs 6.5 1.7 - 7.7 K/uL   Lymphocytes Relative 10 %   Lymphs Abs 0.9 0.7 - 4.0 K/uL   Monocytes Relative 15 %   Monocytes Absolute 1.3 (H) 0.1 - 1.0 K/uL   Eosinophils Relative 0 %   Eosinophils Absolute 0.0 0.0 - 0.5 K/uL   Basophils Relative 0 %   Basophils Absolute 0.0 0.0 - 0.1 K/uL   Immature Granulocytes 0 %   Abs Immature Granulocytes 0.03 0.00 - 0.07 K/uL    Comment: Performed at [facility], [place] [address] [place], [place] [address]  Comprehensive metabolic panel     Status: Abnormal   Collection Time: 10/28/21  1:09 PM  Result Value Ref Range   Sodium 138 135 - 145 mmol/L   Potassium 5.0 3.5 - 5.1 mmol/L   Chloride 102 98 - 111 mmol/L   CO2 27 22 - 32 mmol/L   Glucose, Bld 219 (H) 70 - 99 mg/dL    Comment: Glucose reference range applies only to samples taken after fasting for at least 8 hours.   BUN 30 (H) 8 - 23 mg/dL   Creatinine, Ser 1.53 (H) 0.44 - 1.00 mg/dL   Calcium 9.4 8.9 - 10.3 mg/dL   Total Protein 8.1 6.5 - 8.1 g/dL   Albumin 3.5 3.5 - 5.0 g/dL   AST 50 (H) 15 - 41 U/L   ALT 22 0 - 44 U/L   Alkaline Phosphatase 65 38 - 126 U/L   Total Bilirubin 1.0 0.3 - 1.2 mg/dL   GFR, Estimated 34 (L) >60 mL/min    Comment: (NOTE) Calculated using the CKD-EPI Creatinine Equation (2021)    Anion gap 9 5 - 15    Comment: Performed at [facility],  [place] [address] [place], [place] [address]  CBG monitoring, ED     Status: Abnormal   Collection Time: 10/28/21  2:00 PM  Result Value Ref Range   Glucose-Capillary 186 (H) 70 - 99 mg/dL    Comment: Glucose reference range applies only to samples taken after fasting for at least 8 hours.   CT Head Wo Contrast  Result Date: 10/28/2021 CLINICAL DATA:  Altered mental status EXAM: CT HEAD WITHOUT CONTRAST TECHNIQUE: Contiguous axial images were obtained from the base of the skull through the vertex without intravenous contrast. COMPARISON:  None. FINDINGS: Brain: No acute intracranial hemorrhage, mass effect, or herniation. No extra-axial fluid collections. No evidence of acute territorial infarct.  No hydrocephalus. Mild cortical volume loss. Patchy hypodensities in the periventricular and subcortical white matter, likely secondary to chronic microvascular ischemic changes. Vascular: No hyperdense vessel or unexpected calcification. Skull: No acute fracture.  Hyperostosis frontalis interna. Sinuses/Orbits: Air-fluid level partially seen in the right maxillary sinus. Other: None. IMPRESSION: 1. No acute intracranial process identified. 2. Air-fluid level in the right maxillary sinus, correlate for possible acute sinusitis. Electronically Signed   By: Ofilia Neas M.D.   On: 10/28/2021 14:01   DG Chest Port 1 View  Result Date: 10/28/2021 CLINICAL DATA:  Dyspnea EXAM: PORTABLE CHEST 1 VIEW COMPARISON:  Chest x-ray 04/06/2021 FINDINGS: Heart is mildly enlarged. Mediastinum appears within normal limits. Pulmonary vasculature is normal. No focal consolidation identified. No pleural effusion or pneumothorax. Hiatal hernia. IMPRESSION: No acute process identified.  Hiatal hernia. Electronically Signed   By: Ofilia Neas M.D.   On: 10/28/2021 14:02    Pending Labs Unresulted Labs (From admission, onward)     Start     Ordered   10/28/21 1310  Urinalysis, Routine w reflex microscopic  Once,   STAT         10/28/21 1309   Signed and Held  CBC  (enoxaparin (LOVENOX)    CrCl >/= 30 ml/min)  Once,   R       Comments: Baseline for enoxaparin therapy IF NOT ALREADY DRAWN.  Notify MD if PLT < 100 K.    Signed and Held   Signed and Held  Creatinine, serum  (enoxaparin (LOVENOX)    CrCl >/= 30 ml/min)  Once,   R       Comments: Baseline for enoxaparin therapy IF NOT ALREADY DRAWN.    Signed and Held   Signed and Held  Creatinine, serum  (enoxaparin (LOVENOX)    CrCl >/= 30 ml/min)  Weekly,   R     Comments: while on enoxaparin therapy    Signed and Held   Signed and Held  Comprehensive metabolic panel  Tomorrow morning,   R        Signed and Held   Signed and Held  CBC  Tomorrow morning,   R        Signed and Held   Signed and Held  C-reactive protein  Daily,   R      Signed and Held   Signed and Held  D-dimer, quantitative  Daily,   R      Signed and Held   Signed and Held  Ferritin  Daily,   R      Signed and Held   Signed and Held  Hemoglobin A1c  Once,   R       Comments: To assess prior glycemic control    Signed and Held            Vitals/Pain Today's Vitals   10/28/21 1232 10/28/21 1530 10/28/21 1800 10/28/21 1901  BP: 122/70 (!) 138/54 91/61 106/66  Pulse: 76 74 66 72  Resp: 18 19    Temp: (!) 97.4 F (36.3 C)     TempSrc: Oral     SpO2: 98% 99% 97% 98%    Isolation Precautions No active isolations  Medications Medications  sodium chloride 0.9 % bolus 1,000 mL (0 mLs Intravenous Stopped 10/28/21 1518)    Mobility walks with person assist

## 2021-10-28 NOTE — ED Provider Notes (Addendum)
[facility] DEPT Provider Note   CSN: [id number] Arrival date & time: 10/28/21  1221     History Chief Complaint  Patient presents with   Altered Mental Status    Martha Mata is a 80 y.o. female.  80 year old female with prior medical history as detailed below presents for evaluation.  She is accompanied by her son.  The son reports that she is accompanied by a caretaker in the daytime hours during the week.  He takes care of her during the week and.  Apparently the patient has some baseline dementia.  Yesterday she was cared for by her caretaker.  She was put to bed.  This morning the patient was found next to her bed on the floor.  She had no evidence of significant trauma.  The patient seemed to be more confused than her baseline per the son.  The patient is pleasant.  She is interactive.  She is unable to provide significant history.  She does have a significant history of dementia.  The patient's son who is at bedside reports that she would typically be much more interactive and able to answer more questions.  The history is provided by the patient.  Altered Mental Status Presenting symptoms: behavior changes, confusion and disorientation   Severity:  Moderate     Past Medical History:  Diagnosis Date   Fibroid, uterine    GERD (gastroesophageal reflux disease)    no meds   Hypertension    Memory loss of unknown cause    per pt's admission   Vitamin D deficiency disease     Patient Active Problem List   Diagnosis Date Noted   Psychophysiological insomnia 07/04/2021   Vaginal bleeding 04/11/2021   Stage 3a chronic kidney disease ([place]) 03/23/2021   Diuretic-induced hypokalemia 02/07/2021   Encounter for general adult medical examination with abnormal findings 02/07/2021   Venous stasis dermatitis of both lower extremities 01/28/2020   Bilateral edema of lower extremity 01/18/2020   Type II diabetes mellitus with manifestations  ([place]) 08/26/2019   Routine general medical examination at a health care facility 04/27/2019   Hyperlipidemia LDL goal <130 04/27/2019   Post-traumatic osteoarthritis of left knee 04/27/2019   Hypertension 04/25/2018   Alzheimer's disease ([place]) 05/17/2016    Past Surgical History:  Procedure Laterality Date   CERVIX LESION DESTRUCTION     COLONOSCOPY  2005   DILATATION & CURRETTAGE/HYSTEROSCOPY WITH RESECTOCOPE N/A 08/14/2013   Procedure: DILATATION & CURETTAGE/HYSTEROSCOPY WITH RESECTOCOPE;  Surgeon: Marvene Staff, MD;  Location: [facility];  Service: Gynecology;  Laterality: N/A;   DILATION AND CURETTAGE OF UTERUS     HYSTEROSCOPY     KNEE ARTHROCENTESIS  2010   TUBAL LIGATION     UTERINE FIBROID SURGERY  2005     OB History     Gravida  2   Para  2   Term      Preterm      AB      Living  2      SAB      IAB      Ectopic      Multiple      Live Births              Family History  Problem Relation Age of Onset   Diabetes Son     Social History   Tobacco Use   Smoking status: Never   Smokeless tobacco: Never  Vaping Use   Vaping  Use: Never used  Substance Use Topics   Alcohol use: Never    Comment: occasionally,one glass of wine monthly   Drug use: No    Home Medications Prior to Admission medications   Medication Sig Start Date End Date Taking? Authorizing Provider  amLODipine (NORVASC) 5 MG tablet TAKE ONE TABLET BY MOUTH EVERY MORNING 05/27/21   Janith Lima, MD  Cholecalciferol (VITAMIN D3) 50 MCG (2000 UT) CAPS Take by mouth. Take once a day    [provider]  cyanocobalamin 500 MCG tablet Take 500 mcg by mouth daily.    [provider]  donepezil (ARICEPT) 10 MG tablet Take 1 tablet (10 mg total) by mouth daily. 11/30/20   Lomax, Amy, NP  furosemide (LASIX) 20 MG tablet TAKE ONE TABLET BY MOUTH ONCE DAILY 05/27/21   Janith Lima, MD  KERENDIA 20 MG TABS TAKE ONE TABLET BY MOUTH daily 10/10/21   Janith Lima,  MD  memantine (NAMENDA) 10 MG tablet Take 1 tablet (10 mg total) by mouth 2 (two) times daily. 11/30/20   Lomax, Amy, NP  metoprolol tartrate (LOPRESSOR) 100 MG tablet TAKE ONE TABLET BY MOUTH EVERY MORNING and TAKE ONE TABLET BY MOUTH EVERYDAY AT BEDTIME 05/27/21   Janith Lima, MD  potassium chloride SA (KLOR-CON) 20 MEQ tablet TAKE ONE TABLET BY MOUTH EVERY MORNING 05/27/21   Janith Lima, MD  rosuvastatin (CRESTOR) 20 MG tablet TAKE ONE TABLET BY MOUTH EVERYDAY AT BEDTIME 05/27/21   Janith Lima, MD  Suvorexant (BELSOMRA) 15 MG TABS Take 15 mg by mouth at bedtime as needed. 07/04/21   Janith Lima, MD    Allergies    Wilder Glade [dapagliflozin]  Review of Systems   Review of Systems  Psychiatric/Behavioral:  Positive for confusion.    Physical Exam Updated Vital Signs BP 122/70 (BP Location: Right Arm)   Pulse 76   Temp (!) 97.4 F (36.3 C) (Oral)   Resp 18   SpO2 98%   Physical Exam Vitals and nursing note reviewed.  Constitutional:      General: She is not in acute distress.    Appearance: Normal appearance. She is well-developed.  HENT:     Head: Normocephalic and atraumatic.     Mouth/Throat:     Mouth: Mucous membranes are dry.  Eyes:     Conjunctiva/sclera: Conjunctivae normal.     Pupils: Pupils are equal, round, and reactive to light.  Cardiovascular:     Rate and Rhythm: Normal rate and regular rhythm.     Heart sounds: Normal heart sounds.  Pulmonary:     Effort: Pulmonary effort is normal. No respiratory distress.     Breath sounds: Normal breath sounds.  Abdominal:     General: There is no distension.     Palpations: Abdomen is soft.     Tenderness: There is no abdominal tenderness.  Musculoskeletal:        General: No deformity. Normal range of motion.     Cervical back: Normal range of motion and neck supple.  Skin:    General: Skin is warm and dry.  Neurological:     General: No focal deficit present.     Mental Status: She is alert and oriented  to person, place, and time.    ED Results / Procedures / Treatments   Labs (all labs ordered are listed, but only abnormal results are displayed) Labs Reviewed  RESP PANEL BY RT-PCR (FLU A&B, COVID) ARPGX2 - Abnormal;  Notable for the following components:      Result Value   SARS Coronavirus 2 by RT PCR POSITIVE (*)    All other components within normal limits  CBC WITH DIFFERENTIAL/PLATELET - Abnormal; Notable for the following components:   Monocytes Absolute 1.3 (*)    All other components within normal limits  COMPREHENSIVE METABOLIC PANEL - Abnormal; Notable for the following components:   Glucose, Bld 219 (*)    BUN 30 (*)    Creatinine, Ser 1.53 (*)    AST 50 (*)    GFR, Estimated 34 (*)    All other components within normal limits  COMPREHENSIVE METABOLIC PANEL - Abnormal; Notable for the following components:   Potassium 3.4 (*)    Glucose, Bld 203 (*)    Calcium 8.6 (*)    Albumin 2.8 (*)    All other components within normal limits  CBC - Abnormal; Notable for the following components:   RBC 3.41 (*)    Hemoglobin 10.6 (*)    HCT 33.1 (*)    All other components within normal limits  C-REACTIVE PROTEIN - Abnormal; Notable for the following components:   CRP 5.1 (*)    All other components within normal limits  HEMOGLOBIN A1C - Abnormal; Notable for the following components:   Hgb A1c MFr Bld 8.5 (*)    All other components within normal limits  GLUCOSE, CAPILLARY - Abnormal; Notable for the following components:   Glucose-Capillary 131 (*)    All other components within normal limits  GLUCOSE, CAPILLARY - Abnormal; Notable for the following components:   Glucose-Capillary 127 (*)    All other components within normal limits  GLUCOSE, CAPILLARY - Abnormal; Notable for the following components:   Glucose-Capillary 114 (*)    All other components within normal limits  CBG MONITORING, ED - Abnormal; Notable for the following components:   Glucose-Capillary 186  (*)    All other components within normal limits  D-DIMER, QUANTITATIVE  FERRITIN    EKG EKG Interpretation  Date/Time:  Saturday October 28 2021 13:28:15 EST Ventricular Rate:  76 PR Interval:  164 QRS Duration: 74 QT Interval:  386 QTC Calculation: 434 R Axis:   0 Text Interpretation: Normal sinus rhythm Minimal voltage criteria for LVH, may be normal variant ( R in aVL ) Borderline ECG Confirmed by Dene Gentry [phone]) on 10/28/2021 1:29:28 PM  Radiology CT Head Wo Contrast  Result Date: 10/28/2021 CLINICAL DATA:  Altered mental status EXAM: CT HEAD WITHOUT CONTRAST TECHNIQUE: Contiguous axial images were obtained from the base of the skull through the vertex without intravenous contrast. COMPARISON:  None. FINDINGS: Brain: No acute intracranial hemorrhage, mass effect, or herniation. No extra-axial fluid collections. No evidence of acute territorial infarct. No hydrocephalus. Mild cortical volume loss. Patchy hypodensities in the periventricular and subcortical white matter, likely secondary to chronic microvascular ischemic changes. Vascular: No hyperdense vessel or unexpected calcification. Skull: No acute fracture.  Hyperostosis frontalis interna. Sinuses/Orbits: Air-fluid level partially seen in the right maxillary sinus. Other: None. IMPRESSION: 1. No acute intracranial process identified. 2. Air-fluid level in the right maxillary sinus, correlate for possible acute sinusitis. Electronically Signed   By: Ofilia Neas M.D.   On: 10/28/2021 14:01   DG Chest Port 1 View  Result Date: 10/28/2021 CLINICAL DATA:  Dyspnea EXAM: PORTABLE CHEST 1 VIEW COMPARISON:  Chest x-ray 04/06/2021 FINDINGS: Heart is mildly enlarged. Mediastinum appears within normal limits. Pulmonary vasculature is normal. No focal consolidation identified. No pleural  effusion or pneumothorax. Hiatal hernia. IMPRESSION: No acute process identified.  Hiatal hernia. Electronically Signed   By: Ofilia Neas  M.D.   On: 10/28/2021 14:02    Procedures Procedures   Medications Ordered in ED Medications - No data to display  ED Course  I have reviewed the triage vital signs and the nursing notes.  Pertinent labs & imaging results that were available during my care of the patient were reviewed by me and considered in my medical decision making (see chart for details).    MDM Rules/Calculators/A&P                           MDM  MSE complete  Martha Mata was evaluated in Emergency Department on 10/28/2021 for the symptoms described in the history of present illness. She was evaluated in the context of the global COVID-19 pandemic, which necessitated consideration that the patient might be at risk for infection with the SARS-CoV-2 virus that causes COVID-19. Institutional protocols and algorithms that pertain to the evaluation of patients at risk for COVID-19 are in a state of rapid change based on information released by regulatory bodies including the CDC and federal and state organizations. These policies and algorithms were followed during the patient's care in the ED.    Patient presents accompanied by her son with complaint of generalized weakness, increased confusion, and decreased p.o. intake.  Symptoms began within the last 24 hours.  Patient's son does report possible COVID exposures at the patient's daycare.  Patient is vaccinated and boosted for COVID.  COVID test today is positive.  Patient's labs are suggestive of mild dehydration.  Patient would likely benefit from for further work-up and treatment - IV fluids, etc. - Hospitalist service is aware of case and will evaluate.     Final Clinical Impression(s) / ED Diagnoses Final diagnoses:  Altered mental status, unspecified altered mental status type  Dehydration  COVID    Rx / DC Orders ED Discharge Orders     None        Valarie Merino, MD 10/28/21 1519    Valarie Merino, MD 11/02/21 1348

## 2021-10-28 NOTE — H&P (Signed)
History and Physical    Martha Mata WCB:[id number] DOB: Mar 05, 1941 DOA: 10/28/2021  PCP: Janith Lima, MD  Patient coming from: Home  Chief Complaint: Altered mental status.  HPI: Martha Mata is a 80 y.o. female with medical history significant of dementia, DM, HTN. Presenting with altered mental status. History from son.  Her son reports that she was sitting on the floor in her bedroom when he visited her this morning. He got her up to give her medicines and then get her breakfast. She seemed to be out of sorts. She did not seem herself at all. She is normally more perky and interactive. Today she seemed lethargic and fatigued. She did not complain of anything thing, but she did not want to eat. He became concerned and brought her to the hospital. He was not aware of her complaining any symptoms prior today.   Of note, she attends a memory care center and they had a recent [place].   ED Course: She was noted to be COVID+. She was noted to be in AKI. She was given fluids. TRH was called for admission.   Review of Systems:  Unable to obtain d/t mentation  PMHx Past Medical History:  Diagnosis Date   Fibroid, uterine    GERD (gastroesophageal reflux disease)    no meds   Hypertension    Memory loss of unknown cause    per pt's admission   Vitamin D deficiency disease     PSHx Past Surgical History:  Procedure Laterality Date   CERVIX LESION DESTRUCTION     COLONOSCOPY  2005   DILATATION & CURRETTAGE/HYSTEROSCOPY WITH RESECTOCOPE N/A 08/14/2013   Procedure: DILATATION & CURETTAGE/HYSTEROSCOPY WITH RESECTOCOPE;  Surgeon: Marvene Staff, MD;  Location: [facility];  Service: Gynecology;  Laterality: N/A;   DILATION AND CURETTAGE OF UTERUS     HYSTEROSCOPY     KNEE ARTHROCENTESIS  2010   TUBAL LIGATION     UTERINE FIBROID SURGERY  2005    SocHx  reports that she has never smoked. She has never used smokeless tobacco. She reports that she does not drink alcohol and  does not use drugs.  Allergies  Allergen Reactions   Farxiga [Dapagliflozin] Other (See Comments)    FamHx Family History  Problem Relation Age of Onset   Diabetes Son     Prior to Admission medications   Medication Sig Start Date End Date Taking? Authorizing Provider  amLODipine (NORVASC) 5 MG tablet TAKE ONE TABLET BY MOUTH EVERY MORNING 05/27/21   Janith Lima, MD  Cholecalciferol (VITAMIN D3) 50 MCG (2000 UT) CAPS Take by mouth. Take once a day    [provider]  cyanocobalamin 500 MCG tablet Take 500 mcg by mouth daily.    [provider]  donepezil (ARICEPT) 10 MG tablet Take 1 tablet (10 mg total) by mouth daily. 11/30/20   Lomax, Amy, NP  furosemide (LASIX) 20 MG tablet TAKE ONE TABLET BY MOUTH ONCE DAILY 05/27/21   Janith Lima, MD  KERENDIA 20 MG TABS TAKE ONE TABLET BY MOUTH daily 10/10/21   Janith Lima, MD  memantine (NAMENDA) 10 MG tablet Take 1 tablet (10 mg total) by mouth 2 (two) times daily. 11/30/20   Lomax, Amy, NP  metoprolol tartrate (LOPRESSOR) 100 MG tablet TAKE ONE TABLET BY MOUTH EVERY MORNING and TAKE ONE TABLET BY MOUTH EVERYDAY AT BEDTIME 05/27/21   Janith Lima, MD  potassium chloride SA (KLOR-CON) 20 MEQ tablet TAKE  ONE TABLET BY MOUTH EVERY MORNING 05/27/21   Janith Lima, MD  rosuvastatin (CRESTOR) 20 MG tablet TAKE ONE TABLET BY MOUTH EVERYDAY AT BEDTIME 05/27/21   Janith Lima, MD  Suvorexant (BELSOMRA) 15 MG TABS Take 15 mg by mouth at bedtime as needed. 07/04/21   Janith Lima, MD    Physical Exam: Vitals:   10/28/21 1232  BP: 122/70  Pulse: 76  Resp: 18  Temp: (!) 97.4 F (36.3 C)  TempSrc: Oral  SpO2: 98%    General: 80 y.o. female resting in bed in NAD Eyes: PERRL, normal sclera ENMT: Nares patent w/o discharge, orophaynx clear, dentition normal, ears w/o discharge/lesions/ulcers Neck: Supple, trachea midline Cardiovascular: RRR, +S1, S2, no m/g/r, equal pulses throughout Respiratory: CTABL, no w/r/r,  normal WOB GI: BS+, NDNT, no masses noted, no organomegaly noted MSK: No e/c/c; chronic BLE venous changes Neuro: she tracks around room, but not following commands  Labs on Admission: I have personally reviewed following labs and imaging studies  CBC: Recent Labs  Lab 10/28/21 1309  WBC 8.7  NEUTROABS 6.5  HGB 12.6  HCT 39.6  MCV 97.8  PLT 160   Basic Metabolic Panel: Recent Labs  Lab 10/28/21 1309  NA 138  K 5.0  CL 102  CO2 27  GLUCOSE 219*  BUN 30*  CREATININE 1.53*  CALCIUM 9.4   GFR: CrCl cannot be calculated (Unknown ideal weight.). Liver Function Tests: Recent Labs  Lab 10/28/21 1309  AST 50*  ALT 22  ALKPHOS 65  BILITOT 1.0  PROT 8.1  ALBUMIN 3.5   No results for input(s): LIPASE, AMYLASE in the last 168 hours. No results for input(s): AMMONIA in the last 168 hours. Coagulation Profile: No results for input(s): INR, PROTIME in the last 168 hours. Cardiac Enzymes: No results for input(s): CKTOTAL, CKMB, CKMBINDEX, TROPONINI in the last 168 hours. BNP (last 3 results) No results for input(s): PROBNP in the last 8760 hours. HbA1C: No results for input(s): HGBA1C in the last 72 hours. CBG: Recent Labs  Lab 10/28/21 1400  GLUCAP 186*   Lipid Profile: No results for input(s): CHOL, HDL, LDLCALC, TRIG, CHOLHDL, LDLDIRECT in the last 72 hours. Thyroid Function Tests: No results for input(s): TSH, T4TOTAL, FREET4, T3FREE, THYROIDAB in the last 72 hours. Anemia Panel: No results for input(s): VITAMINB12, FOLATE, FERRITIN, TIBC, IRON, RETICCTPCT in the last 72 hours. Urine analysis:    Component Value Date/Time   COLORURINE YELLOW 04/12/2021 [place] 04/12/2021 1056   LABSPEC 1.010 04/12/2021 1056   PHURINE 5.5 04/12/2021 1056   GLUCOSEU NEGATIVE 04/12/2021 1056   HGBUR LARGE (A) 04/12/2021 1056   BILIRUBINUR NEGATIVE 04/12/2021 1056   BILIRUBINUR negative 08/06/2018 1431   KETONESUR NEGATIVE 04/12/2021 1056   PROTEINUR  Negative 08/06/2018 1431   PROTEINUR NEGATIVE 06/26/2013 1834   UROBILINOGEN 0.2 04/12/2021 1056   NITRITE NEGATIVE 04/12/2021 1056   [place] 04/12/2021 1056    Radiological Exams on Admission: CT Head Wo Contrast  Result Date: 10/28/2021 CLINICAL DATA:  Altered mental status EXAM: CT HEAD WITHOUT CONTRAST TECHNIQUE: Contiguous axial images were obtained from the base of the skull through the vertex without intravenous contrast. COMPARISON:  None. FINDINGS: Brain: No acute intracranial hemorrhage, mass effect, or herniation. No extra-axial fluid collections. No evidence of acute territorial infarct. No hydrocephalus. Mild cortical volume loss. Patchy hypodensities in the periventricular and subcortical white matter, likely secondary to chronic microvascular ischemic changes. Vascular: No hyperdense vessel or unexpected  calcification. Skull: No acute fracture.  Hyperostosis frontalis interna. Sinuses/Orbits: Air-fluid level partially seen in the right maxillary sinus. Other: None. IMPRESSION: 1. No acute intracranial process identified. 2. Air-fluid level in the right maxillary sinus, correlate for possible acute sinusitis. Electronically Signed   By: Ofilia Neas M.D.   On: 10/28/2021 14:01   DG Chest Port 1 View  Result Date: 10/28/2021 CLINICAL DATA:  Dyspnea EXAM: PORTABLE CHEST 1 VIEW COMPARISON:  Chest x-ray 04/06/2021 FINDINGS: Heart is mildly enlarged. Mediastinum appears within normal limits. Pulmonary vasculature is normal. No focal consolidation identified. No pleural effusion or pneumothorax. Hiatal hernia. IMPRESSION: No acute process identified.  Hiatal hernia. Electronically Signed   By: Ofilia Neas M.D.   On: 10/28/2021 14:02    EKG: Independently reviewed. Sinus, no st elevations  Assessment/Plan Acute on chronic metabolic encephalopathy     - placed in obs, tele     - possibly secondary to dehydration or COVID infection; no respiratory symptoms so will  hold on remdes     - give fluids and follow     - check UA     - CTH is negative, no focality noted on exam  AKI Dehydration     - fluids, check renal US  COVID-19     - no respiratory symptoms; supportive care for now  HTN     - hold home diuretics  DM2     - A1c, SSI, glucose checks, DM diet  DVT prophylaxis: lovenox  Code Status: FULL  Family Communication: spoke w/ son by phone  Consults called: None   Status is: Observation  The patient remains OBS appropriate and will d/c before 2 midnights.   Jonnie Finner DO [facility]  If 7PM-7AM, please contact night-coverage [email or web address]  10/28/2021, 3:24 PM

## 2021-10-29 ENCOUNTER — Other Ambulatory Visit: Payer: Self-pay | Admitting: Internal Medicine

## 2021-10-29 DIAGNOSIS — N179 Acute kidney failure, unspecified: Secondary | ICD-10-CM | POA: Diagnosis not present

## 2021-10-29 LAB — COMPREHENSIVE METABOLIC PANEL WITH GFR
ALT: 21 U/L (ref 0–44)
AST: 35 U/L (ref 15–41)
Albumin: 2.8 g/dL — ABNORMAL LOW (ref 3.5–5.0)
Alkaline Phosphatase: 55 U/L (ref 38–126)
Anion gap: 6 (ref 5–15)
BUN: 23 mg/dL (ref 8–23)
CO2: 23 mmol/L (ref 22–32)
Calcium: 8.6 mg/dL — ABNORMAL LOW (ref 8.9–10.3)
Chloride: 111 mmol/L (ref 98–111)
Creatinine, Ser: 0.93 mg/dL (ref 0.44–1.00)
GFR, Estimated: >60 mL/min
Glucose, Bld: 203 mg/dL — ABNORMAL HIGH (ref 70–99)
Potassium: 3.4 mmol/L — ABNORMAL LOW (ref 3.5–5.1)
Sodium: 140 mmol/L (ref 135–145)
Total Bilirubin: 0.4 mg/dL (ref 0.3–1.2)
Total Protein: 6.5 g/dL (ref 6.5–8.1)

## 2021-10-29 LAB — HEMOGLOBIN A1C
Hgb A1c MFr Bld: 8.5 % — ABNORMAL HIGH (ref 4.8–5.6)
Mean Plasma Glucose: 197.25 mg/dL

## 2021-10-29 LAB — D-DIMER, QUANTITATIVE: D-Dimer, Quant: 0.4 ug/mL-FEU (ref 0.00–0.50)

## 2021-10-29 LAB — CBC
HCT: 33.1 % — ABNORMAL LOW (ref 36.0–46.0)
Hemoglobin: 10.6 g/dL — ABNORMAL LOW (ref 12.0–15.0)
MCH: 31.1 pg (ref 26.0–34.0)
MCHC: 32 g/dL (ref 30.0–36.0)
MCV: 97.1 fL (ref 80.0–100.0)
Platelets: 219 10*3/uL (ref 150–400)
RBC: 3.41 MIL/uL — ABNORMAL LOW (ref 3.87–5.11)
RDW: 13.3 % (ref 11.5–15.5)
WBC: 5.3 10*3/uL (ref 4.0–10.5)
nRBC: 0 % (ref 0.0–0.2)

## 2021-10-29 LAB — GLUCOSE, CAPILLARY
Glucose-Capillary: 127 mg/dL — ABNORMAL HIGH (ref 70–99)
Glucose-Capillary: 114 mg/dL — ABNORMAL HIGH (ref 70–99)

## 2021-10-29 LAB — FERRITIN: Ferritin: 106 ng/mL (ref 11–307)

## 2021-10-29 LAB — C-REACTIVE PROTEIN: CRP: 5.1 mg/dL — ABNORMAL HIGH (ref <1.0)

## 2021-10-29 MED ORDER — METOPROLOL TARTRATE 25 MG PO TABS: 25.0000 mg | ORAL_TABLET | Freq: Two times a day (BID) | ORAL | 0 refills | Status: DC

## 2021-10-29 MED ORDER — METOPROLOL TARTRATE 25 MG PO TABS: 12.5000 mg | ORAL_TABLET | Freq: Two times a day (BID) | ORAL | 0 refills | Status: DC

## 2021-10-29 MED ORDER — MOLNUPIRAVIR EUA 200MG CAPSULE: 4.0000 | ORAL_CAPSULE | Freq: Two times a day (BID) | ORAL | 0 refills | Status: AC

## 2021-10-29 MED ORDER — MOLNUPIRAVIR EUA 200MG CAPSULE
4.0000 | ORAL_CAPSULE | Freq: Two times a day (BID) | ORAL | Status: DC
  Administered 2021-10-29: 800 mg via ORAL
  Filled 2021-10-29: qty 4

## 2021-10-29 MED ORDER — METOPROLOL TARTRATE 12.5 MG HALF TABLET
12.5000 mg | ORAL_TABLET | Freq: Two times a day (BID) | ORAL | Status: DC
Start: 2021-10-29 — End: 2021-10-29
  Administered 2021-10-29: 12.5 mg via ORAL
  Filled 2021-10-29: qty 1

## 2021-10-29 MED ORDER — ASCORBIC ACID 500 MG PO TABS: 500.0000 mg | ORAL_TABLET | Freq: Every day | ORAL | 0 refills | Status: AC

## 2021-10-29 MED ORDER — ZINC SULFATE 220 (50 ZN) MG PO CAPS: 220.0000 mg | ORAL_CAPSULE | Freq: Every day | ORAL | 0 refills | Status: AC

## 2021-10-29 NOTE — Discharge Summary (Addendum)
Discharge Summary  Martha Mata GNF:[id number] DOB: [birth year]  PCP: Janith Lima, MD  Admit date: 10/28/2021 Discharge date: 10/29/2021  Time spent: 35 minutes   Recommendations for Outpatient Follow-up:  Follow-up with your primary care provider Take your medications as prescribed Continue PT with assistance and fall precautions.  Discharge Diagnoses:  Active Hospital Problems   Diagnosis Date Noted   AKI (acute kidney injury) ([place]) 10/28/2021    Resolved Hospital Problems  No resolved problems to display.    Discharge Condition: Stable   Diet recommendation: Resume previous diet.  Vitals:   10/29/21 0452 10/29/21 0852  BP: 139/66 (!) 142/74  Pulse: 83 92  Resp: 16 18  Temp: 97.7 F (36.5 C) 97.7 F (36.5 C)  SpO2: 96% 100%    History of present illness:  Martha Mata is a 80 y.o. female with medical history significant of dementia, DM, HTN. Presenting with altered mental status. History from son.  Her son reports that she was sitting on the floor in her bedroom when he visited her this morning. He got her up to give her medicines and then get her breakfast. She seemed to be out of sorts. She did not seem herself at all. She is normally more perky and interactive. Today she seemed lethargic and fatigued. She did not complain of anything thing, but she did not want to eat. He became concerned and brought her to the hospital. He was not aware of her complaining any symptoms prior today.    Of note, she attends a memory care center and they had a recent [place].    ED Course: She was noted to be COVID+. She was noted to be in AKI. She was given fluids. TRH was called for admission.   10/29/2021: Patient was seen and examined at her bedside.  She is more alert and interactive.  She has no new complaints.  Hospital Course:  Principal Problem:   AKI (acute kidney injury) ([place])  Acute metabolic encephalopathy suspect multifactorial secondary to COVID-19 viral  infection, dehydration. CT head nonacute Complete 5 days of Molnupiravir twice daily Received IV fluid hydration Avoid dehydration.   AKI, prerenal in the setting of dehydration Received IV fluid hydration Follow-up with your PCP   COVID-19 No respiratory symptoms Complete 5 days of Molnupiravir twice daily Continue isolation x10 days from 10/28/2021.   HTN Continue to hold off home diuretics.   DM2 Resume home regimen Follow-up with your PCP.  History of hemorrhoids Follow-up with your PCP Consider GI referral from your PCP.    Code Status: FULL  Family Communication: Updated her son via phone on 10/29/2021. Consults called: None    Discharge Exam: BP (!) 142/74 (BP Location: Left Arm)   Pulse 92   Temp 97.7 F (36.5 C) (Oral)   Resp 18   Wt 98.4 kg   SpO2 100%   BMI 35.01 kg/m  General: 80 y.o. year-old female well developed well nourished in no acute distress.  Alert and interactive. Cardiovascular: Regular rate and rhythm with no rubs or gallops.  No thyromegaly or JVD noted.   Respiratory: Clear to auscultation with no wheezes or rales. Good inspiratory effort. Abdomen: Soft nontender nondistended with normal bowel sounds x4 quadrants. Musculoskeletal: Trace lower extremity edema. Psychiatry: Mood is appropriate for condition and setting  Discharge Instructions You were cared for by a hospitalist during your hospital stay. If you have any questions about your discharge medications or the care you received while you were  in the hospital after you are discharged, you can call the unit and asked to speak with the hospitalist on call if the hospitalist that took care of you is not available. Once you are discharged, your primary care physician will handle any further medical issues. Please note that NO REFILLS for any discharge medications will be authorized once you are discharged, as it is imperative that you return to your primary care physician (or establish a  relationship with a primary care physician if you do not have one) for your aftercare needs so that they can reassess your need for medications and monitor your lab values.   Allergies as of 10/29/2021       Reactions   Farxiga [dapagliflozin] Diarrhea   rash        Medication List     STOP taking these medications    amLODipine 5 MG tablet Commonly known as: NORVASC   Belsomra 15 MG Tabs Generic drug: Suvorexant   furosemide 20 MG tablet Commonly known as: LASIX   potassium chloride SA 20 MEQ tablet Commonly known as: KLOR-CON M       TAKE these medications    ascorbic acid 500 MG tablet Commonly known as: VITAMIN C Take 1 tablet (500 mg total) by mouth daily. Start taking on: October 30, 2021   donepezil 10 MG tablet Commonly known as: ARICEPT Take 1 tablet (10 mg total) by mouth daily.   Kerendia 20 MG Tabs Generic drug: Finerenone TAKE ONE TABLET BY MOUTH daily What changed:  how much to take when to take this   memantine 10 MG tablet Commonly known as: NAMENDA Take 1 tablet (10 mg total) by mouth 2 (two) times daily.   metoprolol tartrate 25 MG tablet Commonly known as: LOPRESSOR Take 0.5 tablets (12.5 mg total) by mouth 2 (two) times daily. What changed:  medication strength See the new instructions.   molnupiravir EUA 200 mg Caps capsule Commonly known as: LAGEVRIO Take 4 capsules (800 mg total) by mouth 2 (two) times daily for 5 days.   rosuvastatin 20 MG tablet Commonly known as: CRESTOR TAKE ONE TABLET BY MOUTH EVERYDAY AT BEDTIME What changed: See the new instructions.   vitamin B-12 500 MCG tablet Commonly known as: CYANOCOBALAMIN Take 500 mcg by mouth daily.   vitamin D3 50 MCG (2000 UT) Caps Take 2,000 Units by mouth daily. Take once a day   zinc sulfate 220 (50 Zn) MG capsule Take 1 capsule (220 mg total) by mouth daily. Start taking on: October 30, 2021               Durable Medical Equipment  (From admission,  onward)           Start     Ordered   10/29/21 1247  For home use only DME Walker rolling  Once       Question Answer Comment  Walker: With 5 Inch Wheels   Patient needs a walker to treat with the following condition Ambulatory dysfunction      10/29/21 1246           Allergies  Allergen Reactions   Farxiga [Dapagliflozin] Diarrhea    rash    Follow-up Information     [facility] Follow up.   Specialty: Home Health Services Why: Home Health agency will contact you to set up the first visit. Contact information: [place] [address] [phone]  Janith Lima, MD. Call today.   Specialty: Internal Medicine Why: Please call for a posthospital follow-up appointment. Contact information: [place] [address] [phone]                  The results of significant diagnostics from this hospitalization (including imaging, microbiology, ancillary and laboratory) are listed below for reference.    Significant Diagnostic Studies: CT Head Wo Contrast  Result Date: 10/28/2021 CLINICAL DATA:  Altered mental status EXAM: CT HEAD WITHOUT CONTRAST TECHNIQUE: Contiguous axial images were obtained from the base of the skull through the vertex without intravenous contrast. COMPARISON:  None. FINDINGS: Brain: No acute intracranial hemorrhage, mass effect, or herniation. No extra-axial fluid collections. No evidence of acute territorial infarct. No hydrocephalus. Mild cortical volume loss. Patchy hypodensities in the periventricular and subcortical white matter, likely secondary to chronic microvascular ischemic changes. Vascular: No hyperdense vessel or unexpected calcification. Skull: No acute fracture.  Hyperostosis frontalis interna. Sinuses/Orbits: Air-fluid level partially seen in the right maxillary sinus. Other: None. IMPRESSION: 1. No acute intracranial process identified. 2. Air-fluid level in  the right maxillary sinus, correlate for possible acute sinusitis. Electronically Signed   By: Ofilia Neas M.D.   On: 10/28/2021 14:01   DG Chest Port 1 View  Result Date: 10/28/2021 CLINICAL DATA:  Dyspnea EXAM: PORTABLE CHEST 1 VIEW COMPARISON:  Chest x-ray 04/06/2021 FINDINGS: Heart is mildly enlarged. Mediastinum appears within normal limits. Pulmonary vasculature is normal. No focal consolidation identified. No pleural effusion or pneumothorax. Hiatal hernia. IMPRESSION: No acute process identified.  Hiatal hernia. Electronically Signed   By: Ofilia Neas M.D.   On: 10/28/2021 14:02    Microbiology: Recent Results (from the past 240 hour(s))  Resp Panel by RT-PCR (Flu A&B, Covid) Nasopharyngeal Swab     Status: Abnormal   Collection Time: 10/28/21  1:09 PM   Specimen: Nasopharyngeal Swab; Nasopharyngeal(NP) swabs in vial transport medium  Result Value Ref Range Status   SARS Coronavirus 2 by RT PCR POSITIVE (A) NEGATIVE Final    Comment: RESULT CALLED TO, READ BACK BY AND VERIFIED WITH: BRATU,D EMTP ON 10/28/21 AT 1455 BY GOLSONM (NOTE) SARS-CoV-2 target nucleic acids are DETECTED.  The SARS-CoV-2 RNA is generally detectable in upper respiratory specimens during the acute phase of infection. Positive results are indicative of the presence of the identified virus, but do not rule out bacterial infection or co-infection with other pathogens not detected by the test. Clinical correlation with patient history and other diagnostic information is necessary to determine patient infection status. The expected result is Negative.  Fact Sheet for Patients: EntrepreneurPulse.com.au  Fact Sheet for Healthcare Providers: IncredibleEmployment.be  This test is not yet approved or cleared by the [place] FDA and  has been authorized for detection and/or diagnosis of SARS-CoV-2 by FDA under an Emergency Use Authorization (EUA).  This EUA  will remain in effect (meaning this t est can be used) for the duration of  the COVID-19 declaration under Section 564(b)(1) of the Act, 21 U.S.C. section 360bbb-3(b)(1), unless the authorization is terminated or revoked sooner.     Influenza A by PCR NEGATIVE NEGATIVE Final   Influenza B by PCR NEGATIVE NEGATIVE Final    Comment: (NOTE) The Xpert Xpress SARS-CoV-2/FLU/RSV plus assay is intended as an aid in the diagnosis of influenza from Nasopharyngeal swab specimens and should not be used as a sole basis for treatment. Nasal washings and aspirates are unacceptable for Xpert Xpress SARS-CoV-2/FLU/RSV testing.  Fact Sheet for Patients: EntrepreneurPulse.com.au  Fact Sheet for Healthcare Providers: IncredibleEmployment.be  This test is not yet approved or cleared by the [place] FDA and has been authorized for detection and/or diagnosis of SARS-CoV-2 by FDA under an Emergency Use Authorization (EUA). This EUA will remain in effect (meaning this test can be used) for the duration of the COVID-19 declaration under Section 564(b)(1) of the Act, 21 U.S.C. section 360bbb-3(b)(1), unless the authorization is terminated or revoked.  Performed at [facility], [place] [address] [place], [place] [address]      Labs: Basic Metabolic Panel: Recent Labs  Lab 10/28/21 1309 10/29/21 1150  NA 138 140  K 5.0 3.4*  CL 102 111  CO2 27 23  GLUCOSE 219* 203*  BUN 30* 23  CREATININE 1.53* 0.93  CALCIUM 9.4 8.6*   Liver Function Tests: Recent Labs  Lab 10/28/21 1309 10/29/21 1150  AST 50* 35  ALT 22 21  ALKPHOS 65 55  BILITOT 1.0 0.4  PROT 8.1 6.5  ALBUMIN 3.5 2.8*   No results for input(s): LIPASE, AMYLASE in the last 168 hours. No results for input(s): AMMONIA in the last 168 hours. CBC: Recent Labs  Lab 10/28/21 1309 10/29/21 1150  WBC 8.7 5.3  NEUTROABS 6.5  --   HGB 12.6 10.6*  HCT 39.6 33.1*  MCV 97.8  97.1  PLT 255 219   Cardiac Enzymes: No results for input(s): CKTOTAL, CKMB, CKMBINDEX, TROPONINI in the last 168 hours. BNP: BNP (last 3 results) No results for input(s): BNP in the last 8760 hours.  ProBNP (last 3 results) No results for input(s): PROBNP in the last 8760 hours.  CBG: Recent Labs  Lab 10/28/21 1400 10/28/21 2143 10/29/21 0755 10/29/21 1125  GLUCAP 186* 131* 127* 114*       Signed:  Kayleen Memos, MD [facility] 10/29/2021, 3:24 PM

## 2021-10-29 NOTE — TOC Transition Note (Signed)
Transition of Care [facility]) - CM/SW Discharge Note   Patient Details  Name: Martha Mata MRN: [id number] Date of Birth: [birth year]  [facility]) CM/SW Contact:  Ross Ludwig, LCSW Phone Number: 10/29/2021, 12:56 PM   Clinical Narrative:    CSW was informed that patient will need HH PT and a rolling walker.  CSW spoke to patient's son to discuss if he is interested in home health PT, and he said yes.  CSW asked if patient's son had a preference for an agency and he said no.  CSW was able to contact [facility], and they can accept patient.  CSW asked if he needed any equipment, and patient's son said a rolling walker would be fine.  CSW spoke to [place] [facility] and they are able to deliver to the room.  Patient will be going home with home health PT through [facility].  CSW signing off please reconsult with any other social work needs, home health agency has been notified of planned discharge.    Final next level of care: [place] Barriers to Discharge: Barriers Resolved   Patient Goals and CMS Choice Patient states their goals for this hospitalization and ongoing recovery are:: To go home with home health. CMS Medicare.gov Compare Post Acute Care list provided to:: Patient Represenative (must comment) Choice offered to / list presented to : Adult Children  Discharge Placement                       Discharge Plan and Services                DME Arranged: Walker rolling DME Agency: [employer] Date DME Agency Contacted: 10/29/21 Time DME Agency Contacted: [phone] Representative spoke with at DME Agency: [facility] HH Arranged: PT [place]: [place] Date [place]: 10/29/21 Time [place]: [phone] Representative spoke with at [place]: [place] ([place]) Interventions     Readmission Risk Interventions No flowsheet data found.

## 2021-10-29 NOTE — Evaluation (Signed)
Physical Therapy Evaluation Patient Details Name: Martha Mata MRN: [id number] DOB: 05-19-1941 Today's Date: 10/29/2021  History of Present Illness  80 y/o fe,ale with history of dementia who attends daycare found on the floor next to her bed.  Her son brought her to ED due to increased confusion and decreased interaction. She was found to be COVID+.  Clinical Impression  Pt admitted with above diagnosis. Pt currently with functional limitations due to the deficits listed below (see PT Problem List). Pt will benefit from skilled PT to increase their independence and safety with mobility to allow discharge to the venue listed below.  Pt required MIN A to sit EOB, but MAX A to return to supine.  She was unable to stand, but was easily distracted with her lines.  It is reasonable that as she starts feeling better that she will do better in her own environment and recommend HHPT.  If family is unable to care for her at current level, then SNF may need to be considered.        Recommendations for follow up therapy are one component of a multi-disciplinary discharge planning process, led by the attending physician.  Recommendations may be updated based on patient status, additional functional criteria and insurance authorization.  Follow Up Recommendations Home health PT    Assistance Recommended at Discharge Frequent or constant Supervision/Assistance  Functional Status Assessment Patient has had a recent decline in their functional status and/or demonstrates limited ability to make significant improvements in function in a reasonable and predictable amount of time  Equipment Recommendations  Other (comment) (to be determined)    Recommendations for Other Services       Precautions / Restrictions Precautions Precautions: Fall      Mobility  Bed Mobility Overal bed mobility: Needs Assistance Bed Mobility: Supine to Sit;Sit to Supine     Supine to sit: Min assist Sit to supine: Max  assist   General bed mobility comments: Pt came to the side of the bed with MIN A, however never got fully squared to EOB.  Pt easily distracted with her lines and PT attempted multiple ways of re-directing.  Attempted standing with RW in front of her, also with hands, also placed chair directly next to her to illicit automatic responses, but pt just sat and would not attempt or assist PT in attempting to stand.  Cued to lay back down and could not follow directions and required MAX A to return to supine.    Transfers                   General transfer comment: NT    Ambulation/Gait               General Gait Details: NT  Stairs            Wheelchair Mobility    Modified Rankin (Stroke Patients Only)       Balance Overall balance assessment: History of Falls                                           Pertinent Vitals/Pain Breathing: normal Negative Vocalization: none Facial Expression: smiling or inexpressive Body Language: relaxed Consolability: no need to console PAINAD Score: 0    Home Living Family/patient expects to be discharged to:: Private residence  Additional Comments: Pt unable to answer and family not present.  Per chart review, pt has son and a caretaker and also attends daycare.    Prior Function                       Hand Dominance        Extremity/Trunk Assessment   Upper Extremity Assessment Upper Extremity Assessment: Generalized weakness    Lower Extremity Assessment Lower Extremity Assessment: Generalized weakness       Communication      Cognition Arousal/Alertness: Awake/alert Behavior During Therapy: Flat affect Overall Cognitive Status: No family/caregiver present to determine baseline cognitive functioning                                 General Comments: Per chart, pt has dementia but is normally interactive.  Today she could not state her  name, although when asked if it was Martha Mata she said yes.  EASILY distracted by her cardiac monitoring lines.        General Comments      Exercises     Assessment/Plan    PT Assessment Patient needs continued PT services  PT Problem List Decreased strength;Decreased balance;Decreased cognition;Decreased mobility;Decreased safety awareness       PT Treatment Interventions DME instruction;Gait training;Functional mobility training;Therapeutic exercise;Therapeutic activities    PT Goals (Current goals can be found in the Care Plan section)  Acute Rehab PT Goals Patient Stated Goal: unable PT Goal Formulation: Patient unable to participate in goal setting Time For Goal Achievement: 11/12/21 Potential to Achieve Goals: Good    Frequency Min 3X/week   Barriers to discharge        Co-evaluation               AM-PAC PT "6 Clicks" Mobility  Outcome Measure Help needed turning from your back to your side while in a flat bed without using bedrails?: A Lot Help needed moving from lying on your back to sitting on the side of a flat bed without using bedrails?: A Lot Help needed moving to and from a bed to a chair (including a wheelchair)?: Total Help needed standing up from a chair using your arms (e.g., wheelchair or bedside chair)?: Total Help needed to walk in hospital room?: Total Help needed climbing 3-5 steps with a railing? : Total 6 Click Score: 8    End of Session Equipment Utilized During Treatment: Gait belt Activity Tolerance: Other (comment) (flat affect and unable to follow directions, verbal or visual, easily distracted, but not agitated in any way.) Patient left: in bed;with call bell/phone within reach;with bed alarm set Nurse Communication: Mobility status PT Visit Diagnosis: Muscle weakness (generalized) (M62.81);History of falling (Z91.81);Other abnormalities of gait and mobility (R26.89)    Time: 6384-6659 PT Time Calculation (min) (ACUTE ONLY): 26  min   Charges:   PT Evaluation $PT Eval Low Complexity: 1 Low PT Treatments $Therapeutic Activity: 8-22 mins        Milan Perkins L. Tamala Julian, [place]  10/29/2021   Galen Manila 10/29/2021, 9:49 AM

## 2021-10-31 ENCOUNTER — Telehealth: Payer: Self-pay

## 2021-10-31 ENCOUNTER — Encounter: Payer: Self-pay | Admitting: Internal Medicine

## 2021-10-31 NOTE — Telephone Encounter (Signed)
Key: [id number]

## 2021-10-31 NOTE — Telephone Encounter (Signed)
Approved through 11/18/2022. °

## 2021-11-02 ENCOUNTER — Telehealth: Payer: Self-pay | Admitting: Internal Medicine

## 2021-11-02 NOTE — Telephone Encounter (Signed)
Edwena Blow from [facility] calling in  Wanted to advise that patient has refused home health services

## 2021-11-14 ENCOUNTER — Other Ambulatory Visit: Payer: Self-pay | Admitting: Internal Medicine

## 2021-11-22 ENCOUNTER — Other Ambulatory Visit: Payer: Self-pay | Admitting: Internal Medicine

## 2021-11-29 NOTE — Progress Notes (Signed)
PATIENT: Martha Mata DOB: November 17, 1941  REASON FOR VISIT: follow up HISTORY FROM: patient  Chief Complaint  Patient presents with   Follow-up    RM 2 with son. Last seen 11/30/20. Last MMSE unable to complete. No changes since last visit.      HISTORY OF PRESENT ILLNESS: 11/30/21  ALL: Martha Mata returns for follow up for dementia. She continues donepezil 10mg  QHS and memantine 10mg  BID. She presents with her son who aids in history. She is doing well. She was hospitalized for altered mental status in setting of dehydration and positive Covid in 10/2021. She has recovered weel and now back at home. She does go to [employer] 4 hours a day 4 days a week. She has a caregiver is with her 4 days a week. She has a neighbor that checks on her daily to watch medications. She has another caregiver that comes on the weekends. Her son check on her frequently. She is alone at night but her son has 5 cameras that watch various places in the home to check on her any time. She does needs assistance with ADLs. She does not drive. She does well with routine. No behavioral concerns. She sleeps well. Appetite is normal. No falls.   11/30/2020 ALL:  She returns today for follow-up. She presents with her son who aids in history. He reports that symptoms are stable. No significant changes since last being seen. She continues Aricept and Namenda and is tolerating well. She does continue to live alone but has a caregiver that comes multiple times a week. Medications are now dosed in a pill pack. She is doing very well with this. She is able to perform ADLs independently. She remains active. She goes to the [facility] center [facility] 4 days a week. She has vaccinated and had her booster.  History (copied from previous notes) 11/30/2019 Martha Mata is a 81 y.o. female here today for follow up for dementia. She continues Aricept and Namenda. She is doing well. She continues to live alone. She does have a  caregiver who comes 4-5 times a week and stays with her. Her son, who lives in [place], [place], comes on the weekends. She is able to move around the home without difficulty. No falls. No assistive devices. She is eating well. She has gained about 10 pounds since last being seen. This is contributed to being at home more and not being as active. She is able to fix a sandwich or warm foods but does not cook. She now requires assistance with medication administration. There were some concerns of her moving around medications in pill container. Caregiver now assists with administration and medications are secured. She does not drive.  Memory care center is back operating three days a week. Her son feels that, overall, she is doing very well.    HISTORY: (copied from [place] note on 11/26/2018)  Martha Mata is a 81 year old female with a history of dementia.  She returns today for follow-up.  She remains on Aricept and Namenda.  She currently lives at home.  She does have a caregiver that is with her in the morning that she goes to a adult day center during the day.  She requires assistance with all ADLs.  She no longer prepares any meals but she is able to make sandwiches and heat up food. her family help manage her finances.  The caregiver and her son helps with her medications.  No significant change in  her mood or behavior.  Denies any trouble sleeping.  No hallucinations.  She returns today for evaluation.   HISTORY /05/2018: Here for follow up on dementia. Stable on Namenda and Aricept. She is here with her son. Lives at home with daily aid. No drastic changes. No falls. Patient feels happy and she feels good. Appetite is good. Sleeping is good. Here with son and caretaker. Lives with son and has a caretaker. She goes to a Health visitor 3x a week.    05/27/2017: Ms. Martha Mata is a 81 year old female with a history of memory disturbance. She returns today for follow-up. She is here today with  her son. She reports that she continues to live at home. She does have caregivers that checks on her frequently. She does stay at home alone at night. She does have a life alert necklace that she can use it needed. Her son denies any episodes of wandering. Patient is able to complete all ADLs independently. Her son reports that there is limited cooking. She does cook bacon and and soups. He denies any trouble using the stove. Her son manages all of her finances. She denies any trouble sleeping. Denies hallucinations. Denies any changes with her mood or behavior. She does use public transportation to go to a connections group. She returns today for an evaluation.    11/26/2016: Ms. Martha Mata is a 81 year old female with a history of memory disturbance. She returns today for follow-up. She is here today with her caretaker and son. They report that her memory has remained stable. She continues to live at home alone. She does have caregivers that check on her throughout the day. She reports that she is able to complete most ADLs independently. She does prepare her own meals without difficulty. Her son helps her with her finances. She does not operate a motor vehicle. Denies any trouble sleeping. Denies getting up at night wandering around the house. Denies hallucinations. Denies restlessness or agitation. Overall she feels that she is doing well. Family agrees with this. She is on Aricept and tolerating it well. She returns today for an evaluation.   HPI:  Martha Mata is a 81 y.o. female here as a referral from Dr. Carlis Abbott for memory loss. Here with caretaker who provides information. She is here as a follow up.  Memory changes started about 3 years ago. Slowly progressive, slowly getting worse. Started with short-term memory problems, repeating conversations. Caretaker says she loses her keys, she had to put a key rack in her room, she forgets things she is told, asks the same questions over and over again, forgets  conversations, she cooks quick things doesn't cook big meals anymore. Caretaker is there a few hours a day. She lives alone in a condo. Caretaker does the driving, no accidents in the home, no falls. Son keep a camera in the house to watch her and make sure she is safe, he lives in [place]. Discussed assisted living close to son, recommended that she look into it. No hallucinations, agitation, no issues at all. No depression, no behavioral problems, she is still very social, goes to the [employer] weekly. Son has taken over finances and caring for the house in the last few years. She takes OTC b12. TSH has been normal in the past.    Reviewed notes, labs and imaging from outside physicians, which showed:   CBC nml, CMP with creatinine is 0.86 normal labs were taken 03/06/2016, LDL 104. TSH in the past  has been normal. Patient takes daily B12 supplementation so we'll not check B12.   MRI of the brain 09/07/2013 (personally reviewed imaging and agree with the following):   Mildly abnormal MRI brain (without) demonstrating: 1. Few scattered foci of non-specific gliosis in the subcortical and juxtacortical white matter.   2. Single left occipital punctate focus of SWI hypointensity, may represent a cerebral microhemorrhage. 3. Above findings may be related to underlying mild chronic small vessel ischemic disease.   REVIEW OF SYSTEMS: Out of a complete 14 system review of symptoms, the patient complains only of the following symptoms, none and all other reviewed systems are negative.  ALLERGIES: Allergies  Allergen Reactions   Farxiga [Dapagliflozin] Diarrhea    rash    HOME MEDICATIONS: Outpatient Medications Prior to Visit  Medication Sig Dispense Refill   ascorbic acid (VITAMIN C) 500 MG tablet Take 1 tablet (500 mg total) by mouth daily. 90 tablet 0   Cholecalciferol (VITAMIN D3) 50 MCG (2000 UT) CAPS Take 2,000 Units by mouth daily. Take once a day     cyanocobalamin 500 MCG  tablet Take 500 mcg by mouth daily.     KERENDIA 20 MG TABS TAKE ONE TABLET BY MOUTH daily (Patient taking differently: Take 20 mg by mouth 2 (two) times daily.) 90 tablet 1   metoprolol tartrate (LOPRESSOR) 25 MG tablet TAKE 0.5 TABLETS BY MOUTH 2 TIMES DAILY. 90 tablet 0   rosuvastatin (CRESTOR) 20 MG tablet TAKE ONE TABLET BY MOUTH EVERYDAY AT BEDTIME (Patient taking differently: Take 20 mg by mouth daily.) 30 tablet 5   zinc sulfate 220 (50 Zn) MG capsule Take 1 capsule (220 mg total) by mouth daily. 90 capsule 0   donepezil (ARICEPT) 10 MG tablet Take 1 tablet (10 mg total) by mouth daily. 90 tablet 3   memantine (NAMENDA) 10 MG tablet Take 1 tablet (10 mg total) by mouth 2 (two) times daily. 180 tablet 3   No facility-administered medications prior to visit.    PAST MEDICAL HISTORY: Past Medical History:  Diagnosis Date   Fibroid, uterine    GERD (gastroesophageal reflux disease)    no meds   Hypertension    Memory loss of unknown cause    per pt's admission   Vitamin D deficiency disease     PAST SURGICAL HISTORY: Past Surgical History:  Procedure Laterality Date   CERVIX LESION DESTRUCTION     COLONOSCOPY  2005   DILATATION & CURRETTAGE/HYSTEROSCOPY WITH RESECTOCOPE N/A 08/14/2013   Procedure: DILATATION & CURETTAGE/HYSTEROSCOPY WITH RESECTOCOPE;  Surgeon: Marvene Staff, MD;  Location: [facility];  Service: Gynecology;  Laterality: N/A;   DILATION AND CURETTAGE OF UTERUS     HYSTEROSCOPY     KNEE ARTHROCENTESIS  2010   TUBAL LIGATION     UTERINE FIBROID SURGERY  2005    FAMILY HISTORY: Family History  Problem Relation Age of Onset   Diabetes Son     SOCIAL HISTORY: Social History   Socioeconomic History   Marital status: Widowed    Spouse name: Not on file   Number of children: 2   Years of education: 12+   Highest education level: Not on file  Occupational History   Occupation: Retired  Tobacco Use   Smoking status: Never   Smokeless tobacco: Never   Vaping Use   Vaping Use: Never used  Substance and Sexual Activity   Alcohol use: Never    Comment: occasionally,one glass of wine monthly   Drug use: No  Sexual activity: Not Currently  Other Topics Concern   Not on file  Social History Narrative   Patient is retired.    Patient is a non-smoker    Patient lives at home alone. Has a caretaker who comes in to help as needed.   Patient consumes tea and coffee (1-2 cups daily)   Patient is right handed.   Patient has a college education.   Patient has two children.   Social Determinants of Health   Financial Resource Strain: Not on file  Food Insecurity: Not on file  Transportation Needs: Not on file  Physical Activity: Not on file  Stress: Not on file  Social Connections: Not on file  Intimate Partner Violence: Not on file      PHYSICAL EXAM  Vitals:   11/30/21 1058  BP: (!) 151/80  Pulse: 82  SpO2: 98%  Weight: 224 lb 8 oz (101.8 kg)  Height: 5\' 6"  (1.676 m)    Body mass index is 36.24 kg/m.  Generalized: Well developed, in no acute distress  Cardiology: normal rate and rhythm, no murmur noted Respiratory: clear to auscultation bilaterally  Neurological examination  Mentation: Alert, not oriented to time, place, or history taking. Follows intermittent commands speech and language fluent, sings when you ask a question Cranial nerve II-XII: Pupils were equal round reactive to light. Extraocular movements were full, visual field were full on confrontational test. Facial sensation and strength were normal.  Motor: The motor testing reveals 5 over 5 strength of all 4 extremities. Good symmetric motor tone is noted throughout.  Sensory: unable to fully assess but patient denies pain when touching extremities  Coordination: unable to complete Gait and station: Gait is short but stable    DIAGNOSTIC DATA (LABS, IMAGING, TESTING) - I reviewed patient records, labs, notes, testing and imaging myself where  available.  MMSE - Mini Mental State Exam 11/30/2021 11/30/2020 11/26/2018  Not completed: Unable to complete Unable to complete (No Data)  Orientation to time - - 0  Orientation to Place - - 0  Registration - - 3  Attention/ Calculation - - 0  Recall - - 0  Language- name 2 objects - - 1  Language- repeat - - 1  Language- follow 3 step command - - 2  Language- read & follow direction - - 0  Write a sentence - - 0  Copy design - - 0  Total score - - 7     Lab Results  Component Value Date   WBC 5.3 10/29/2021   HGB 10.6 (L) 10/29/2021   HCT 33.1 (L) 10/29/2021   MCV 97.1 10/29/2021   PLT 219 10/29/2021      Component Value Date/Time   NA 140 10/29/2021 1150   K 3.4 (L) 10/29/2021 1150   CL 111 10/29/2021 1150   CO2 23 10/29/2021 1150   GLUCOSE 203 (H) 10/29/2021 1150   BUN 23 10/29/2021 1150   CREATININE 0.93 10/29/2021 1150   CALCIUM 8.6 (L) 10/29/2021 1150   PROT 6.5 10/29/2021 1150   ALBUMIN 2.8 (L) 10/29/2021 1150   AST 35 10/29/2021 1150   ALT 21 10/29/2021 1150   ALKPHOS 55 10/29/2021 1150   BILITOT 0.4 10/29/2021 1150   GFRNONAA >60 10/29/2021 1150   GFRAA 55 (L) 08/22/2017 0850   Lab Results  Component Value Date   CHOL 111 02/07/2021   HDL 46.70 02/07/2021   LDLCALC 50 02/07/2021   TRIG 76.0 02/07/2021   CHOLHDL 2 02/07/2021  Lab Results  Component Value Date   HGBA1C 8.5 (H) 10/29/2021   Lab Results  Component Value Date   VITAMINB12 >1999 (H) 08/11/2013   Lab Results  Component Value Date   TSH 1.95 02/07/2021       ASSESSMENT AND PLAN 81 y.o. year old female  has a past medical history of Fibroid, uterine, GERD (gastroesophageal reflux disease), Hypertension, Memory loss of unknown cause, and Vitamin D deficiency disease. here with     ICD-10-CM   1. Moderate dementia without behavioral disturbance, psychotic disturbance, mood disturbance, or anxiety, unspecified dementia type  F03.B0     2. Dementia without behavioral disturbance  (HCC)  F03.90 memantine (NAMENDA) 10 MG tablet    donepezil (ARICEPT) 10 MG tablet        Overall, Clydia is doing well. We will continue Aricpet and Namenda. I have reviewed progression of disease with her son. He was encouraged to call [facility] regarding home PT services ordered by hospital. Epic note reports patient declined services. We have discussed safety precautions. She will continue close follow up with PCP. She will follow up with me in 1 year, sooner if needed. Her son verbalizes understanding and agreement with this plan.    No orders of the defined types were placed in this encounter.    Meds ordered this encounter  Medications   memantine (NAMENDA) 10 MG tablet    Sig: Take 1 tablet (10 mg total) by mouth 2 (two) times daily.    Dispense:  180 tablet    Refill:  3    Order Specific Question:   Supervising Provider    Answer:   Melvenia Beam [[id number]]   donepezil (ARICEPT) 10 MG tablet    Sig: Take 1 tablet (10 mg total) by mouth daily.    Dispense:  90 tablet    Refill:  3    Order Specific Question:   Supervising Provider    Answer:   Bess Harvest, FNP-C 11/30/2021, 11:30 AM [facility] [address] [place], [place] [address] [phone]

## 2021-11-29 NOTE — Patient Instructions (Signed)
Below is our plan:  We will continue donepezil 10mg  daily at bedtime and memantine 10mg  twice daily.   Please make sure you are staying well hydrated. I recommend 50-60 ounces daily. Well balanced diet and regular exercise encouraged. Consistent sleep schedule with 6-8 hours recommended.   Please continue follow up with care team as directed.   Follow up with me in 1 year   You may receive a survey regarding today's visit. I encourage you to leave honest feed back as I do use this information to improve patient care. Thank you for seeing me today!   Management of Memory Problems   There are some general things you can do to help manage your memory problems.  Your memory may not in fact recover, but by using techniques and strategies you will be able to manage your memory difficulties better.   1)  Establish a routine. Try to establish and then stick to a regular routine.  By doing this, you will get used to what to expect and you will reduce the need to rely on your memory.  Also, try to do things at the same time of day, such as taking your medication or checking your calendar first thing in the morning. Think about think that you can do as a part of a regular routine and make a list.  Then enter them into a daily planner to remind you.  This will help you establish a routine.   2)  Organize your environment. Organize your environment so that it is uncluttered.  Decrease visual stimulation.  Place everyday items such as keys or cell phone in the same place every day (ie.  Basket next to front door) Use post it notes with a brief message to yourself (ie. Turn off light, lock the door) Use labels to indicate where things go (ie. Which cupboards are for food, dishes, etc.) Keep a notepad and pen by the telephone to take messages   3)  Memory Aids A diary or journal/notebook/daily planner Making a list (shopping list, chore list, to do list that needs to be done) Using an alarm as a  reminder (kitchen timer or cell phone alarm) Using cell phone to store information (Notes, Calendar, Reminders) Calendar/White board placed in a prominent position Post-it notes   In order for memory aids to be useful, you need to have good habits.  It's no good remembering to make a note in your journal if you don't remember to look in it.  Try setting aside a certain time of day to look in journal.   4)  Improving mood and managing fatigue. There may be other factors that contribute to memory difficulties.  Factors, such as anxiety, depression and tiredness can affect memory. Regular gentle exercise can help improve your mood and give you more energy. Simple relaxation techniques may help relieve symptoms of anxiety Try to get back to completing activities or hobbies you enjoyed doing in the past. Learn to pace yourself through activities to decrease fatigue. Find out about some local support groups where you can share experiences with others. Try and achieve 7-8 hours of sleep at night.

## 2021-11-30 ENCOUNTER — Encounter: Payer: Self-pay | Admitting: Family Medicine

## 2021-11-30 ENCOUNTER — Ambulatory Visit: Payer: Medicare Other | Admitting: Family Medicine

## 2021-11-30 VITALS — BP 151/80 | HR 82 | Ht 66.0 in | Wt 224.5 lb

## 2021-11-30 DIAGNOSIS — F03B0 Unspecified dementia, moderate, without behavioral disturbance, psychotic disturbance, mood disturbance, and anxiety: Secondary | ICD-10-CM | POA: Diagnosis not present

## 2021-11-30 DIAGNOSIS — F0390 Unspecified dementia without behavioral disturbance: Secondary | ICD-10-CM | POA: Diagnosis not present

## 2021-11-30 MED ORDER — DONEPEZIL HCL 10 MG PO TABS: 10.0000 mg | ORAL_TABLET | Freq: Every day | ORAL | 3 refills | Status: DC

## 2021-11-30 MED ORDER — MEMANTINE HCL 10 MG PO TABS: 10.0000 mg | ORAL_TABLET | Freq: Two times a day (BID) | ORAL | 3 refills | Status: DC

## 2021-12-01 ENCOUNTER — Other Ambulatory Visit: Payer: Self-pay | Admitting: Internal Medicine

## 2021-12-01 DIAGNOSIS — E785 Hyperlipidemia, unspecified: Secondary | ICD-10-CM

## 2021-12-01 MED ORDER — ROSUVASTATIN CALCIUM 20 MG PO TABS: 20.0000 mg | ORAL_TABLET | Freq: Every day | ORAL | 1 refills | Status: DC

## 2022-01-12 ENCOUNTER — Emergency Department (HOSPITAL_COMMUNITY): Payer: Medicare Other

## 2022-01-12 ENCOUNTER — Encounter (HOSPITAL_COMMUNITY): Payer: Self-pay

## 2022-01-12 ENCOUNTER — Emergency Department (HOSPITAL_COMMUNITY)
Admission: EM | Admit: 2022-01-12 | Discharge: 2022-01-12 | Disposition: A | Discharge status: Home / Self Care | Payer: Medicare Other | Attending: Emergency Medicine | Admitting: Emergency Medicine

## 2022-01-12 DIAGNOSIS — F0390 Unspecified dementia without behavioral disturbance: Secondary | ICD-10-CM | POA: Diagnosis not present

## 2022-01-12 DIAGNOSIS — W19XXXA Unspecified fall, initial encounter: Secondary | ICD-10-CM | POA: Insufficient documentation

## 2022-01-12 DIAGNOSIS — S90511A Abrasion, right ankle, initial encounter: Secondary | ICD-10-CM | POA: Diagnosis not present

## 2022-01-12 DIAGNOSIS — I517 Cardiomegaly: Secondary | ICD-10-CM | POA: Diagnosis not present

## 2022-01-12 DIAGNOSIS — S80811A Abrasion, right lower leg, initial encounter: Secondary | ICD-10-CM | POA: Diagnosis not present

## 2022-01-12 DIAGNOSIS — S0990XA Unspecified injury of head, initial encounter: Secondary | ICD-10-CM | POA: Diagnosis not present

## 2022-01-12 DIAGNOSIS — Z23 Encounter for immunization: Secondary | ICD-10-CM | POA: Diagnosis not present

## 2022-01-12 DIAGNOSIS — T1490XA Injury, unspecified, initial encounter: Secondary | ICD-10-CM

## 2022-01-12 DIAGNOSIS — R6889 Other general symptoms and signs: Secondary | ICD-10-CM | POA: Diagnosis not present

## 2022-01-12 DIAGNOSIS — Y92007 Garden or yard of unspecified non-institutional (private) residence as the place of occurrence of the external cause: Secondary | ICD-10-CM | POA: Diagnosis not present

## 2022-01-12 DIAGNOSIS — K449 Diaphragmatic hernia without obstruction or gangrene: Secondary | ICD-10-CM | POA: Diagnosis not present

## 2022-01-12 DIAGNOSIS — Z20822 Contact with and (suspected) exposure to covid-19: Secondary | ICD-10-CM | POA: Insufficient documentation

## 2022-01-12 DIAGNOSIS — Z743 Need for continuous supervision: Secondary | ICD-10-CM | POA: Diagnosis not present

## 2022-01-12 DIAGNOSIS — I6782 Cerebral ischemia: Secondary | ICD-10-CM | POA: Insufficient documentation

## 2022-01-12 DIAGNOSIS — Z043 Encounter for examination and observation following other accident: Secondary | ICD-10-CM | POA: Diagnosis not present

## 2022-01-12 DIAGNOSIS — M47812 Spondylosis without myelopathy or radiculopathy, cervical region: Secondary | ICD-10-CM | POA: Insufficient documentation

## 2022-01-12 DIAGNOSIS — I10 Essential (primary) hypertension: Secondary | ICD-10-CM | POA: Diagnosis not present

## 2022-01-12 DIAGNOSIS — S99911A Unspecified injury of right ankle, initial encounter: Secondary | ICD-10-CM | POA: Diagnosis present

## 2022-01-12 HISTORY — DX: Type 2 diabetes mellitus without complications: E11.9

## 2022-01-12 LAB — I-STAT CHEM 8, ED
BUN: 19 mg/dL (ref 8–23)
Calcium, Ion: 1.15 mmol/L (ref 1.15–1.40)
Chloride: 109 mmol/L (ref 98–111)
Creatinine, Ser: 1 mg/dL (ref 0.44–1.00)
Glucose, Bld: 170 mg/dL — ABNORMAL HIGH (ref 70–99)
HCT: 39 % (ref 36.0–46.0)
Hemoglobin: 13.3 g/dL (ref 12.0–15.0)
Potassium: 5 mmol/L (ref 3.5–5.1)
Sodium: 142 mmol/L (ref 135–145)
TCO2: 26 mmol/L (ref 22–32)

## 2022-01-12 LAB — COMPREHENSIVE METABOLIC PANEL
ALT: 17 U/L (ref 0–44)
AST: 22 U/L (ref 15–41)
Albumin: 3.2 g/dL — ABNORMAL LOW (ref 3.5–5.0)
Alkaline Phosphatase: 75 U/L (ref 38–126)
Anion gap: 9 (ref 5–15)
BUN: 13 mg/dL (ref 8–23)
CO2: 25 mmol/L (ref 22–32)
Calcium: 9.3 mg/dL (ref 8.9–10.3)
Chloride: 107 mmol/L (ref 98–111)
Creatinine, Ser: 1.04 mg/dL — ABNORMAL HIGH (ref 0.44–1.00)
GFR, Estimated: 54 mL/min — ABNORMAL LOW (ref >60)
Glucose, Bld: 166 mg/dL — ABNORMAL HIGH (ref 70–99)
Potassium: 4.1 mmol/L (ref 3.5–5.1)
Sodium: 141 mmol/L (ref 135–145)
Total Bilirubin: 0.5 mg/dL (ref 0.3–1.2)
Total Protein: 7.1 g/dL (ref 6.5–8.1)

## 2022-01-12 LAB — CBC
HCT: 38.7 % (ref 36.0–46.0)
Hemoglobin: 12.3 g/dL (ref 12.0–15.0)
MCH: 31.1 pg (ref 26.0–34.0)
MCHC: 31.8 g/dL (ref 30.0–36.0)
MCV: 98 fL (ref 80.0–100.0)
Platelets: 247 10*3/uL (ref 150–400)
RBC: 3.95 MIL/uL (ref 3.87–5.11)
RDW: 13.4 % (ref 11.5–15.5)
WBC: 7.6 10*3/uL (ref 4.0–10.5)
nRBC: 0 % (ref 0.0–0.2)

## 2022-01-12 LAB — SAMPLE TO BLOOD BANK

## 2022-01-12 LAB — RESP PANEL BY RT-PCR (FLU A&B, COVID) ARPGX2
Influenza A by PCR: NEGATIVE
Influenza B by PCR: NEGATIVE
SARS Coronavirus 2 by RT PCR: NEGATIVE

## 2022-01-12 LAB — PROTIME-INR
INR: 1 (ref 0.8–1.2)
Prothrombin Time: 13.6 seconds (ref 11.4–15.2)

## 2022-01-12 LAB — LACTIC ACID, PLASMA: Lactic Acid, Venous: 1.6 mmol/L (ref 0.5–1.9)

## 2022-01-12 LAB — CK: Total CK: 412 U/L — ABNORMAL HIGH (ref 38–234)

## 2022-01-12 LAB — ETHANOL: Alcohol, Ethyl (B): <10 mg/dL (ref <10)

## 2022-01-12 MED ORDER — TETANUS-DIPHTH-ACELL PERTUSSIS 5-2.5-18.5 LF-MCG/0.5 IM SUSY
0.5000 mL | PREFILLED_SYRINGE | Freq: Once | INTRAMUSCULAR | Status: AC
  Administered 2022-01-12: 0.5 mL via INTRAMUSCULAR
  Filled 2022-01-12: qty 0.5

## 2022-01-12 MED ORDER — SODIUM CHLORIDE 0.9 % IV BOLUS
1000.0000 mL | Freq: Once | INTRAVENOUS | Status: AC
  Administered 2022-01-12: 1000 mL via INTRAVENOUS

## 2022-01-12 NOTE — ED Notes (Signed)
Lab called and stated tubes were hemolyzed. New specimen collected and sent at this time.

## 2022-01-12 NOTE — ED Notes (Addendum)
Trauma Response Nurse Documentation   Martha Mata is a 81 y.o. female arriving to [facility] ED via EMS  On No antithrombotic. Trauma was activated as a Level 2 by ED Charge Nurse RN based on the following trauma criteria GCS 10-14 associated with trauma or AVPU < A. Trauma RNat the bedside on patient arrival. Patient cleared for CT by Dr. Pearline Cables. Patient to CT with team. GCS 13-14. (Baseline due to Dementia).  History   Past Medical History:  Diagnosis Date   Diabetes mellitus without complication (HCC)    Fibroid, uterine    GERD (gastroesophageal reflux disease)    no meds   Hypertension    Memory loss of unknown cause    per pt's admission   Vitamin D deficiency disease      Past Surgical History:  Procedure Laterality Date   CERVIX LESION DESTRUCTION     COLONOSCOPY  2005   DILATATION & CURRETTAGE/HYSTEROSCOPY WITH RESECTOCOPE N/A 08/14/2013   Procedure: DILATATION & CURETTAGE/HYSTEROSCOPY WITH RESECTOCOPE;  Surgeon: Marvene Staff, MD;  Location: [facility];  Service: Gynecology;  Laterality: N/A;   DILATION AND CURETTAGE OF UTERUS     HYSTEROSCOPY     KNEE ARTHROCENTESIS  2010   TUBAL LIGATION     UTERINE FIBROID SURGERY  2005       Initial Focused Assessment (If applicable, or please see trauma documentation): - GCS 13-14  - PERRLA - C-collar in place - 18G L AC -  Pt very cold to the touch  CT's Completed:   CT Head and CT C-Spine   Interventions:  - Trauma labs - CXR - Pelvic XR - CTH and c-spine - spoke with patient's caregiver, [name] at bedside. - manual BP obtained - Rectal temp 94.6 - Bear hugger applied. - tdap given   Plan for disposition:  Other: Unsure at this time.  Awaiting results.  Consults completed:  none at 1240.  Event Summary: - Pt lives in apartment complex by herself but has a caregiver that stays with her throughout the day and has a neighbor administer meds @ night and tucks her in.  Pt's son lives in [place] and has an alert  on his phone for when her door is opened.  He didn't hear the alert this morning but pt apparently opened her door @ 0100.  A neighbor happened to look outside their window this morning and saw pt down an embankment.  EMS was called and pt was extricated from approx a 6 ft embankment and it took approx 1 hr to do so.  Pt shows no signs of external trauma aside from small abrasion to R shin.  Pt is very pleasantly confused but this is her baseline. Pt's caregiver, [name] is now at her bedside.  Pt's son is en route from [facility].   MTP Summary (If applicable): n/a  Bedside handoff with ED RN Delana Meyer.    Clovis Cao  Trauma Response RN  Please call TRN at [phone] for further assistance.

## 2022-01-12 NOTE — ED Notes (Signed)
Patient transported to CT 

## 2022-01-12 NOTE — ED Triage Notes (Signed)
EMS reports pt was found outside home, possibly fall down embankment. Unknown downtime. Hx of dementia, HTN, DM. Pt presents to ED with c-collar. GCS 14.

## 2022-01-12 NOTE — ED Provider Notes (Signed)
[facility] EMERGENCY DEPARTMENT  Provider Note  CSN: [id number] Arrival date & time: 01/12/22 6759  History Chief Complaint  Patient presents with   Fall   HPI and ROS are limited by patient's dementia.  Martha Mata is a 81 y.o. female brought in by EMS.  They report patient was found in her backyard this morning.  It is unclear how she ended up there while she was there.  She was cold on their arrival.   Home Medications Prior to Admission medications   Medication Sig Start Date End Date Taking? Authorizing Provider  ascorbic acid (VITAMIN C) 500 MG tablet Take 1 tablet (500 mg total) by mouth daily. 10/30/21 01/28/22  Kayleen Memos, DO  Cholecalciferol (VITAMIN D3) 50 MCG (2000 UT) CAPS Take 2,000 Units by mouth daily. Take once a day    [provider]  cyanocobalamin 500 MCG tablet Take 500 mcg by mouth daily.    [provider]  donepezil (ARICEPT) 10 MG tablet Take 1 tablet (10 mg total) by mouth daily. 11/30/21   Lomax, Amy, NP  KERENDIA 20 MG TABS TAKE ONE TABLET BY MOUTH daily Patient taking differently: Take 20 mg by mouth 2 (two) times daily. 10/10/21   Janith Lima, MD  memantine (NAMENDA) 10 MG tablet Take 1 tablet (10 mg total) by mouth 2 (two) times daily. 11/30/21   Lomax, Amy, NP  metoprolol tartrate (LOPRESSOR) 25 MG tablet TAKE 0.5 TABLETS BY MOUTH 2 TIMES DAILY. 11/22/21   Janith Lima, MD  rosuvastatin (CRESTOR) 20 MG tablet Take 1 tablet (20 mg total) by mouth daily. 12/01/21   Janith Lima, MD  zinc sulfate 220 (50 Zn) MG capsule Take 1 capsule (220 mg total) by mouth daily. 10/30/21 01/28/22  Kayleen Memos, DO     Allergies    Farxiga [dapagliflozin]   Review of Systems   Review of Systems  Unable to perform ROS: Dementia  Please see HPI for pertinent positives and negatives  Physical Exam BP (!) 150/81 (BP Location: Right Arm)    Pulse 90    Temp (!) 95.9 F (35.5 C) (Rectal)    Resp 13    Ht 5\' 7"   (1.702 m)    Wt 104.3 kg    SpO2 100%    BMI 36.02 kg/m   Physical Exam Vitals and nursing note reviewed.  Constitutional:      General: She is not in acute distress.    Appearance: She is well-developed.  HENT:     Head: Normocephalic and atraumatic.  Eyes:     Conjunctiva/sclera: Conjunctivae normal.  Cardiovascular:     Rate and Rhythm: Normal rate and regular rhythm.     Heart sounds: No murmur heard. Pulmonary:     Effort: Pulmonary effort is normal. No respiratory distress.     Breath sounds: Normal breath sounds.  Abdominal:     Palpations: Abdomen is soft.     Tenderness: There is no abdominal tenderness.  Musculoskeletal:        General: No swelling.     Cervical back: Neck supple.     Comments: Abrasions to distal tibia on the right. Scattered abrasions to bilateral feet  Skin:    General: Skin is warm and dry.     Capillary Refill: Capillary refill takes less than 2 seconds.  Neurological:     Mental Status: She is alert.  Psychiatric:        Mood and  Affect: Mood normal.    ED Results / Procedures / Treatments   EKG None  Procedures Procedures  Medications Ordered in the ED Medications  Tdap (BOOSTRIX) injection 0.5 mL (0.5 mLs Intramuscular Given 01/12/22 0955)  sodium chloride 0.9 % bolus 1,000 mL (0 mLs Intravenous Stopped 01/12/22 1455)     ED Course       MDM   This patient presents to the ED for concern of a fall, this involves an extensive number of treatment options, and is a complaint that carries with it a high risk of complications and morbidity.  The differential diagnosis includes infection, delirium, worsening dementia, confusion. Patients presentation is complicated by their history of dementia   Additional history obtained: Additional history obtained from family, EMS , and caregiver Records reviewed previous admission documents, Care Everywhere/External Records, and Primary Care Documents  Lab Tests: I Ordered, and personally  interpreted labs.  The pertinent results include: Slight elevation in CK.  No other acute abnormalities.  Imaging Studies ordered: I ordered imaging studies including CT scan head and neck as well as chest x-ray and pelvis x-ray. I independently visualized and interpreted imaging which showed no acute traumatic injuries on any of her imaging studies. I agree with the radiologist interpretation  EKG (personally reviewed and interpreted): No dysrhythmia.  No ischemia.  No STEMI.   Medical Decision Making: Patient presented after being found down outside.  Estimated the patient was down for approximately 6 to 7 hours outside.  She was cold to the touch on arrival.  Core temperature was less than 95.  She was normotensive.  Work-up was grossly markable.  There is no acute findings on laboratory studies or imaging.  Patient continue to improve here in the ED.  She returned to her baseline per family which is pleasantly demented.  Her core temperature continue to improve.  It appears the patient became confused last night and wandered outside.  Family states that she has otherwise been acting normal.  No recent sick symptoms.  Feel patient is safe to discharge home with her family and caregiver.  Family is comfortable with this plan.  Discussed return precautions and the importance of close outpatient follow-up with.  Complexity of problems addressed: Patients presentation is most consistent with  acute presentation with potential threat to life or bodily function  Disposition: After consideration of the diagnostic results and the patients response to treatment,  I feel that the patent would benefit from discharge home .   Patient seen in conjunction with my attending, Dr. Pearline Cables.    Final Clinical Impression(s) / ED Diagnoses Final diagnoses:  Trauma    Rx / DC Orders ED Discharge Orders     None         Jacelyn Pi, MD 01/12/22 [place], [place], DO 01/12/22 2024

## 2022-01-12 NOTE — ED Notes (Signed)
Pt verbalized understanding of d/c instructions, meds, and followup care. Denies questions. VSS, no distress noted. Family has no questions. Dressed in paper scrubs assisted to wheelchair. Assisted to lobby and into [place] with family.

## 2022-01-12 NOTE — ED Notes (Signed)
Pt repositioned in bed, soiled linen changed. Rectal temp of 95.4 obtained and bair hugger reinforced.

## 2022-02-08 ENCOUNTER — Other Ambulatory Visit: Payer: Self-pay | Admitting: Internal Medicine

## 2022-02-23 ENCOUNTER — Other Ambulatory Visit: Payer: Self-pay | Admitting: Internal Medicine

## 2022-04-05 ENCOUNTER — Encounter: Payer: Self-pay | Admitting: Internal Medicine

## 2022-04-05 NOTE — Patient Instructions (Addendum)
     Blood work was ordered.      Medications changes include :   none     Return in about 6 months (around 10/07/2022) for follow up with PCP.

## 2022-04-05 NOTE — Progress Notes (Signed)
Subjective:    Patient ID: Martha Mata, female    DOB: [birth year], 81 y.o.   MRN: [id number]     HPI Martha Mata is here for follow up of her chronic medical problems, including htn, DM, CKD, hld  Her Son was on the phone during the visit.  She is here with a home health aide.  She is not eating as much.  They have to prompt her.  She has not she is sleeping a little better for the most part.    Last week sugars was 240.  Once in a while it is elevated.     Medications and allergies reviewed with patient and updated if appropriate.  Current Outpatient Medications on File Prior to Visit  Medication Sig Dispense Refill   Cholecalciferol (VITAMIN D3) 50 MCG (2000 UT) CAPS Take 2,000 Units by mouth daily. Take once a day     cyanocobalamin 500 MCG tablet Take 500 mcg by mouth daily.     donepezil (ARICEPT) 10 MG tablet Take 1 tablet (10 mg total) by mouth daily. 90 tablet 3   KERENDIA 20 MG TABS TAKE ONE TABLET BY MOUTH daily (Patient taking differently: Take 20 mg by mouth 2 (two) times daily.) 90 tablet 1   memantine (NAMENDA) 10 MG tablet Take 1 tablet (10 mg total) by mouth 2 (two) times daily. 180 tablet 3   metoprolol tartrate (LOPRESSOR) 25 MG tablet TAKE 1/2 TABLET BY MOUTH TWICE A DAY 90 tablet 0   rosuvastatin (CRESTOR) 20 MG tablet Take 1 tablet (20 mg total) by mouth daily. 90 tablet 1   No current facility-administered medications on file prior to visit.     Review of Systems  Unable to perform ROS: Dementia      Objective:   Vitals:   04/06/22 0935  BP: (!) 144/82  Pulse: 60  Resp: 15  Temp: 98.1 F (36.7 C)   BP Readings from Last 3 Encounters:  04/06/22 (!) 144/82  01/12/22 (!) 121/59  11/30/21 (!) 151/80   Wt Readings from Last 3 Encounters:  04/06/22 200 lb (90.7 kg)  01/12/22 230 lb (104.3 kg)  11/30/21 224 lb 8 oz (101.8 kg)   Body mass index is 32.28 kg/m.    Physical Exam Constitutional:      General: She is not in acute  distress.    Appearance: Normal appearance.  HENT:     Head: Normocephalic and atraumatic.  Eyes:     Conjunctiva/sclera: Conjunctivae normal.  Cardiovascular:     Rate and Rhythm: Normal rate and regular rhythm.     Heart sounds: Normal heart sounds. No murmur heard. Pulmonary:     Effort: Pulmonary effort is normal. No respiratory distress.     Breath sounds: Normal breath sounds. No wheezing.  Musculoskeletal:     Cervical back: Neck supple.     Right lower leg: Edema (Trace) present.     Left lower leg: Edema (Trace) present.  Lymphadenopathy:     Cervical: No cervical adenopathy.  Skin:    General: Skin is warm and dry.     Findings: No rash.  Neurological:     Mental Status: She is alert.  Psychiatric:        Mood and Affect: Mood normal.        Behavior: Behavior normal.       Lab Results  Component Value Date   WBC 7.6 01/12/2022   HGB 13.3 01/12/2022   HCT 39.0  01/12/2022   PLT 247 01/12/2022   GLUCOSE 166 (H) 01/12/2022   CHOL 111 02/07/2021   TRIG 76.0 02/07/2021   HDL 46.70 02/07/2021   LDLCALC 50 02/07/2021   ALT 17 01/12/2022   AST 22 01/12/2022   NA 141 01/12/2022   K 4.1 01/12/2022   CL 107 01/12/2022   CREATININE 1.04 (H) 01/12/2022   BUN 13 01/12/2022   CO2 25 01/12/2022   TSH 1.95 02/07/2021   INR 1.0 01/12/2022   HGBA1C 8.5 (H) 10/29/2021   MICROALBUR <0.7 02/07/2021     Assessment & Plan:   Hypertension: Chronic Blood pressure controlled CMP Continue metoprolol 12.5 mg twice daily  Diabetes, type II: Chronic With hyperlipidemia, chronic kidney disease Last A1c 8.5% December 2022 Not ideally controlled She is not eating as much and has lost weight, which may also help the sugars Discussed with her family and occasional high sugars not too concerning.  May need to add low-dose medication, but want to be careful about over controlling especially since she is not always eating regularly   Chronic kidney disease, 3A: Chronic On  Kerendia 20 mg daily  Hyperlipidemia: Chronic Check lipid panel  Continue Crestor 20 mg daily

## 2022-04-06 ENCOUNTER — Ambulatory Visit (INDEPENDENT_AMBULATORY_CARE_PROVIDER_SITE_OTHER): Payer: Medicare Other | Admitting: Internal Medicine

## 2022-04-06 VITALS — BP 144/82 | HR 60 | Temp 98.1°F | Resp 15 | Ht 66.0 in | Wt 200.0 lb

## 2022-04-06 DIAGNOSIS — I10 Essential (primary) hypertension: Secondary | ICD-10-CM

## 2022-04-06 DIAGNOSIS — E785 Hyperlipidemia, unspecified: Secondary | ICD-10-CM | POA: Diagnosis not present

## 2022-04-06 DIAGNOSIS — N1831 Chronic kidney disease, stage 3a: Secondary | ICD-10-CM | POA: Diagnosis not present

## 2022-04-06 DIAGNOSIS — E118 Type 2 diabetes mellitus with unspecified complications: Secondary | ICD-10-CM

## 2022-04-06 LAB — COMPREHENSIVE METABOLIC PANEL
ALT: 18 U/L (ref 0–35)
AST: 20 U/L (ref 0–37)
Albumin: 4 g/dL (ref 3.5–5.2)
Alkaline Phosphatase: 65 U/L (ref 39–117)
BUN: 13 mg/dL (ref 6–23)
CO2: 27 mEq/L (ref 19–32)
Calcium: 10 mg/dL (ref 8.4–10.5)
Chloride: 107 mEq/L (ref 96–112)
Creatinine, Ser: 1.33 mg/dL — ABNORMAL HIGH (ref 0.40–1.20)
GFR: 37.52 mL/min — ABNORMAL LOW (ref >60.00)
Glucose, Bld: 221 mg/dL — ABNORMAL HIGH (ref 70–99)
Potassium: 3.7 mEq/L (ref 3.5–5.1)
Sodium: 146 mEq/L — ABNORMAL HIGH (ref 135–145)
Total Bilirubin: 0.4 mg/dL (ref 0.2–1.2)
Total Protein: 7.7 g/dL (ref 6.0–8.3)

## 2022-04-06 LAB — LIPID PANEL
Cholesterol: 94 mg/dL (ref 0–200)
HDL: 36.3 mg/dL — ABNORMAL LOW (ref >39.00)
LDL Cholesterol: 36 mg/dL (ref 0–99)
NonHDL: 57.5
Total CHOL/HDL Ratio: 3
Triglycerides: 109 mg/dL (ref 0.0–149.0)
VLDL: 21.8 mg/dL (ref 0.0–40.0)

## 2022-04-06 LAB — HEMOGLOBIN A1C: Hgb A1c MFr Bld: 7.7 % — ABNORMAL HIGH (ref 4.6–6.5)

## 2022-04-09 LAB — MICROALBUMIN / CREATININE URINE RATIO
Creatinine,U: 286.8 mg/dL
Microalb Creat Ratio: 0.4 mg/g (ref 0.0–30.0)
Microalb, Ur: 1.2 mg/dL (ref 0.0–1.9)

## 2022-05-02 ENCOUNTER — Other Ambulatory Visit: Payer: Self-pay | Admitting: Internal Medicine

## 2022-05-14 ENCOUNTER — Other Ambulatory Visit: Payer: Self-pay | Admitting: Internal Medicine

## 2022-05-14 DIAGNOSIS — E785 Hyperlipidemia, unspecified: Secondary | ICD-10-CM

## 2022-07-02 ENCOUNTER — Encounter: Payer: Self-pay | Admitting: Internal Medicine

## 2022-07-26 ENCOUNTER — Ambulatory Visit (INDEPENDENT_AMBULATORY_CARE_PROVIDER_SITE_OTHER): Payer: Medicare Other

## 2022-07-26 ENCOUNTER — Encounter: Payer: Self-pay | Admitting: Internal Medicine

## 2022-07-26 ENCOUNTER — Ambulatory Visit (INDEPENDENT_AMBULATORY_CARE_PROVIDER_SITE_OTHER): Payer: Medicare Other | Admitting: Internal Medicine

## 2022-07-26 VITALS — BP 130/60 | HR 64 | Temp 97.5°F | Ht 66.0 in | Wt 190.8 lb

## 2022-07-26 DIAGNOSIS — Z0000 Encounter for general adult medical examination without abnormal findings: Secondary | ICD-10-CM

## 2022-07-26 DIAGNOSIS — N939 Abnormal uterine and vaginal bleeding, unspecified: Secondary | ICD-10-CM

## 2022-07-26 DIAGNOSIS — Z23 Encounter for immunization: Secondary | ICD-10-CM | POA: Diagnosis not present

## 2022-07-26 NOTE — Assessment & Plan Note (Signed)
Has seen gyn last 05/2021. They found uterine polyp and did endometrial biopsy at that time. It seems likely this is causing intermittent bleeding. Given her severe dementia I feel that further treatment or workup of this problem would be more traumatic than helpful. I have advised that if she has rare spotting this can be normal for her. If persistent bleeding, soaking a pad with blood let us know as treatment could stop bleeding. Son present with her and agrees with and understands treatment.

## 2022-07-26 NOTE — Progress Notes (Signed)
   Subjective:   Patient ID: Martha Mata, female    DOB: [birth year], 81 y.o.   MRN: [id number]  Vaginal Bleeding   The patient is an 81 YO female coming in for vaginal spotting. Son present with her and helps to provide history.   Review of Systems  Unable to perform ROS: Dementia  Genitourinary:  Positive for vaginal bleeding.    Objective:  Physical Exam Constitutional:      Appearance: She is well-developed.  HENT:     Head: Normocephalic and atraumatic.  Cardiovascular:     Rate and Rhythm: Normal rate and regular rhythm.  Pulmonary:     Effort: Pulmonary effort is normal. No respiratory distress.     Breath sounds: Normal breath sounds. No wheezing or rales.  Abdominal:     General: Bowel sounds are normal. There is no distension.     Palpations: Abdomen is soft.     Tenderness: There is no abdominal tenderness. There is no rebound.  Musculoskeletal:     Cervical back: Normal range of motion.  Skin:    General: Skin is warm and dry.  Neurological:     Mental Status: She is alert.     Coordination: Coordination abnormal.     Comments: Slow gait and took 3 efforts to stand     Vitals:   07/26/22 1023  BP: 130/60  Pulse: 64  Temp: (!) 97.5 F (36.4 C)  TempSrc: Oral  SpO2: 98%  Weight: 190 lb (86.2 kg)  Height: '5\' 6"'$  (1.676 m)    Assessment & Plan:  Visit time 15 minutes in face to face communication with patient and coordination of care, additional 5 minutes spent in record review, coordination or care, ordering tests, communicating/referring to other healthcare professionals, documenting in medical records all on the same day of the visit for total time 20 minutes spent on the visit.

## 2022-07-26 NOTE — Patient Instructions (Signed)
Martha Mata , Thank you for taking time to come for your Medicare Wellness Visit. I appreciate your ongoing commitment to your health goals. Please review the following plan we discussed and let me know if I can assist you in the future.   Screening recommendations/referrals: Colonoscopy: Not a candidate for screening due to age 81: 09/09/2019 Bone Density: Never done Recommended yearly ophthalmology/optometry visit for glaucoma screening and checkup Recommended yearly dental visit for hygiene and checkup  Vaccinations: Influenza vaccine: 07/26/2022 Pneumococcal vaccine: 04/25/2018, 04/27/2019 Tdap vaccine: 01/12/2022; due every 10 years Shingles vaccine: 01/31/2017, 04/04/2017   Covid-19:12/01/2019, 12/31/2019  Advanced directives: Yes  Conditions/risks identified: Yes  Next appointment: Follow up in one year for your annual wellness visit.   Preventive Care 13 Years and Older, Female Preventive care refers to lifestyle choices and visits with your health care provider that can promote health and wellness. What does preventive care include? A yearly physical exam. This is also called an annual well check. Dental exams once or twice a year. Routine eye exams. Ask your health care provider how often you should have your eyes checked. Personal lifestyle choices, including: Daily care of your teeth and gums. Regular physical activity. Eating a healthy diet. Avoiding tobacco and drug use. Limiting alcohol use. Practicing safe sex. Taking low-dose aspirin every day. Taking vitamin and mineral supplements as recommended by your health care provider. What happens during an annual well check? The services and screenings done by your health care provider during your annual well check will depend on your age, overall health, lifestyle risk factors, and family history of disease. Counseling  Your health care provider may ask you questions about your: Alcohol use. Tobacco use. Drug  use. Emotional well-being. Home and relationship well-being. Sexual activity. Eating habits. History of falls. Memory and ability to understand (cognition). Work and work Statistician. Reproductive health. Screening  You may have the following tests or measurements: Height, weight, and BMI. Blood pressure. Lipid and cholesterol levels. These may be checked every 5 years, or more frequently if you are over 52 years old. Skin check. Lung cancer screening. You may have this screening every year starting at age 20 if you have a 30-pack-year history of smoking and currently smoke or have quit within the past 15 years. Fecal occult blood test (FOBT) of the stool. You may have this test every year starting at age 43. Flexible sigmoidoscopy or colonoscopy. You may have a sigmoidoscopy every 5 years or a colonoscopy every 10 years starting at age 14. Hepatitis C blood test. Hepatitis B blood test. Sexually transmitted disease (STD) testing. Diabetes screening. This is done by checking your blood sugar (glucose) after you have not eaten for a while (fasting). You may have this done every 1-3 years. Bone density scan. This is done to screen for osteoporosis. You may have this done starting at age 21. Mammogram. This may be done every 1-2 years. Talk to your health care provider about how often you should have regular mammograms. Talk with your health care provider about your test results, treatment options, and if necessary, the need for more tests. Vaccines  Your health care provider may recommend certain vaccines, such as: Influenza vaccine. This is recommended every year. Tetanus, diphtheria, and acellular pertussis (Tdap, Td) vaccine. You may need a Td booster every 10 years. Zoster vaccine. You may need this after age 34. Pneumococcal 13-valent conjugate (PCV13) vaccine. One dose is recommended after age 80. Pneumococcal polysaccharide (PPSV23) vaccine. One dose is recommended after  age  47. Talk to your health care provider about which screenings and vaccines you need and how often you need them. This information is not intended to replace advice given to you by your health care provider. Make sure you discuss any questions you have with your health care provider. Document Released: 12/02/2015 Document Revised: 07/25/2016 Document Reviewed: 09/06/2015 Elsevier Interactive Patient Education  2017 [employer] Prevention in the Home Falls can cause injuries. They can happen to people of all ages. There are many things you can do to make your home safe and to help prevent falls. What can I do on the outside of my home? Regularly fix the edges of walkways and driveways and fix any cracks. Remove anything that might make you trip as you walk through a door, such as a raised step or threshold. Trim any bushes or trees on the path to your home. Use bright outdoor lighting. Clear any walking paths of anything that might make someone trip, such as rocks or tools. Regularly check to see if handrails are loose or broken. Make sure that both sides of any steps have handrails. Any raised decks and porches should have guardrails on the edges. Have any leaves, snow, or ice cleared regularly. Use sand or salt on walking paths during winter. Clean up any spills in your garage right away. This includes oil or grease spills. What can I do in the bathroom? Use night lights. Install grab bars by the toilet and in the tub and shower. Do not use towel bars as grab bars. Use non-skid mats or decals in the tub or shower. If you need to sit down in the shower, use a plastic, non-slip stool. Keep the floor dry. Clean up any water that spills on the floor as soon as it happens. Remove soap buildup in the tub or shower regularly. Attach bath mats securely with double-sided non-slip rug tape. Do not have throw rugs and other things on the floor that can make you trip. What can I do in the  bedroom? Use night lights. Make sure that you have a light by your bed that is easy to reach. Do not use any sheets or blankets that are too big for your bed. They should not hang down onto the floor. Have a firm chair that has side arms. You can use this for support while you get dressed. Do not have throw rugs and other things on the floor that can make you trip. What can I do in the kitchen? Clean up any spills right away. Avoid walking on wet floors. Keep items that you use a lot in easy-to-reach places. If you need to reach something above you, use a strong step stool that has a grab bar. Keep electrical cords out of the way. Do not use floor polish or wax that makes floors slippery. If you must use wax, use non-skid floor wax. Do not have throw rugs and other things on the floor that can make you trip. What can I do with my stairs? Do not leave any items on the stairs. Make sure that there are handrails on both sides of the stairs and use them. Fix handrails that are broken or loose. Make sure that handrails are as long as the stairways. Check any carpeting to make sure that it is firmly attached to the stairs. Fix any carpet that is loose or worn. Avoid having throw rugs at the top or bottom of the stairs. If you do  have throw rugs, attach them to the floor with carpet tape. Make sure that you have a light switch at the top of the stairs and the bottom of the stairs. If you do not have them, ask someone to add them for you. What else can I do to help prevent falls? Wear shoes that: Do not have high heels. Have rubber bottoms. Are comfortable and fit you well. Are closed at the toe. Do not wear sandals. If you use a stepladder: Make sure that it is fully opened. Do not climb a closed stepladder. Make sure that both sides of the stepladder are locked into place. Ask someone to hold it for you, if possible. Clearly mark and make sure that you can see: Any grab bars or  handrails. First and last steps. Where the edge of each step is. Use tools that help you move around (mobility aids) if they are needed. These include: Canes. Walkers. Scooters. Crutches. Turn on the lights when you go into a dark area. Replace any light bulbs as soon as they burn out. Set up your furniture so you have a clear path. Avoid moving your furniture around. If any of your floors are uneven, fix them. If there are any pets around you, be aware of where they are. Review your medicines with your doctor. Some medicines can make you feel dizzy. This can increase your chance of falling. Ask your doctor what other things that you can do to help prevent falls. This information is not intended to replace advice given to you by your health care provider. Make sure you discuss any questions you have with your health care provider. Document Released: 09/01/2009 Document Revised: 04/12/2016 Document Reviewed: 12/10/2014 Elsevier Interactive Patient Education  2017 [employer].

## 2022-07-26 NOTE — Patient Instructions (Signed)
We will watch this and if the bleeding is heavy or doesn't stop okay to evaluate it. Otherwise this may happen from time to time.

## 2022-07-26 NOTE — Progress Notes (Signed)
Subjective:   Martha Mata is a 81 y.o. female who presents for Medicare Annual (Subsequent) preventive examination.  Review of Systems     Cardiac Risk Factors include: advanced age (>20mn, >>42women);diabetes mellitus;dyslipidemia;family history of premature cardiovascular disease;hypertension;obesity (BMI >30kg/m2)     Objective:    Today's Vitals   07/26/22 0939  BP: 130/60  Pulse: 64  Temp: (!) 97.5 F (36.4 C)  SpO2: 98%  Weight: 190 lb 12.8 oz (86.5 kg)  Height: '5\' 6"'$  (1.676 m)  PainSc: 0-No pain   Body mass index is 30.8 kg/m.     07/26/2022   10:13 AM 10/28/2021   12:35 PM 07/21/2021    6:02 PM 08/22/2017    8:38 AM 08/14/2013   11:39 AM 07/22/2013    6:12 AM  Advanced Directives  Does Patient Have a Medical Advance Directive? Yes No Yes No Patient does not have advance directive Patient does not have advance directive  Type of Advance Directive [place]  [place]    Does patient want to make changes to medical advance directive?   Yes (ED - Information included in AVS)     Copy of [place]? No - copy requested  Yes - validated most recent copy scanned in chart (See row information)     Would patient like information on creating a medical advance directive?    No - Patient declined    Pre-existing out of facility DNR order (yellow form or pink MOST form)     No     Current Medications (verified) Outpatient Encounter Medications as of 07/26/2022  Medication Sig   Ascorbic Acid (VITAMIN C) 100 MG tablet Take 500 mg by mouth daily. With zinc   Cholecalciferol (VITAMIN D3) 50 MCG (2000 UT) CAPS Take 2,000 Units by mouth daily. Take once a day   cyanocobalamin 500 MCG tablet Take 500 mcg by mouth daily.   donepezil (ARICEPT) 10 MG tablet Take 1 tablet (10 mg total) by mouth daily.   KERENDIA 20 MG TABS TAKE ONE TABLET BY MOUTH daily (Patient taking differently: Take 20 mg by mouth 2  (two) times daily.)   memantine (NAMENDA) 10 MG tablet Take 1 tablet (10 mg total) by mouth 2 (two) times daily.   metoprolol tartrate (LOPRESSOR) 25 MG tablet TAKE 1/2 TABLET BY MOUTH TWICE A DAY   rosuvastatin (CRESTOR) 20 MG tablet TAKE ONE TABLET BY MOUTH EVERYDAY AT BEDTIME   No facility-administered encounter medications on file as of 07/26/2022.    Allergies (verified) Farxiga [dapagliflozin]   History: Past Medical History:  Diagnosis Date   Diabetes mellitus without complication (HCC)    Fibroid, uterine    GERD (gastroesophageal reflux disease)    no meds   Hypertension    Memory loss of unknown cause    per pt's admission   Vitamin D deficiency disease    Past Surgical History:  Procedure Laterality Date   CERVIX LESION DESTRUCTION     COLONOSCOPY  2005   DILATATION & CURRETTAGE/HYSTEROSCOPY WITH RESECTOCOPE N/A 08/14/2013   Procedure: DILATATION & CURETTAGE/HYSTEROSCOPY WITH RESECTOCOPE;  Surgeon: SMarvene Staff MD;  [facility];  Service: Gynecology;  Laterality: N/A;   DILATION AND CURETTAGE OF UTERUS     HYSTEROSCOPY     KNEE ARTHROCENTESIS  2010   TUBAL LIGATION     UTERINE FIBROID SURGERY  2005   Family History  Problem Relation Age of  Onset   Diabetes Son    Social History   Socioeconomic History   Marital status: Widowed    Spouse name: Not on file   Number of children: 2   Years of education: 12+   Highest education level: Not on file  Occupational History   Occupation: Retired  Tobacco Use   Smoking status: Never   Smokeless tobacco: Never  Vaping Use   Vaping Use: Never used  Substance and Sexual Activity   Alcohol use: Never    Comment: occasionally,one glass of wine monthly   Drug use: No   Sexual activity: Not Currently  Other Topics Concern   Not on file  Social History Narrative   Patient is retired.    Patient is a non-smoker    Patient lives at home alone. Has a caretaker who comes in to help as needed.   Patient  consumes tea and coffee (1-2 cups daily)   Patient is right handed.   Patient has a college education.   Patient has two children.   Social Determinants of Health   Financial Resource Strain: Low Risk  (07/26/2022)   Overall Financial Resource Strain (CARDIA)    Difficulty of Paying Living Expenses: Not hard at all  Food Insecurity: No Food Insecurity (07/26/2022)   Hunger Vital Sign    Worried About Running Out of Food in the Last Year: Never true    Ran Out of Food in the Last Year: Never true  Transportation Needs: No Transportation Needs (07/26/2022)   PRAPARE - Hydrologist (Medical): No    Lack of Transportation (Non-Medical): No  Physical Activity: Sufficiently Active (07/26/2022)   Exercise Vital Sign    Days of Exercise per Week: 4 days    Minutes of Exercise per Session: 60 min  Stress: No Stress Concern Present (07/26/2022)   [place]    Feeling of Stress : Not at all  Social Connections: [place] (07/26/2022)   Social Connection and Isolation Panel [NHANES]    Frequency of Communication with Friends and Family: More than three times a week    Frequency of Social Gatherings with Friends and Family: More than three times a week    Attends Religious Services: More than 4 times per year    Active Member of [employer] or Organizations: Yes    Attends Music therapist: More than 4 times per year    Marital Status: Married    Tobacco Counseling Counseling given: Not Answered   Clinical Intake:  Pre-visit preparation completed: Yes  Pain : No/denies pain Pain Score: 0-No pain     BMI - recorded: 30.8 Nutritional Status: BMI > 30  Obese Nutritional Risks: None Diabetes: Yes CBG done?: No Did pt. bring in CBG monitor from home?: No  How often do you need to have someone help you when you read instructions, pamphlets, or other written materials from your  doctor or pharmacy?: 1 - Never What is the last grade level you completed in school?: HSG; some college courses  Diabetic? yes  Interpreter Needed?: No  Information entered by :: Lisette Abu, LPN.   Activities of Daily Living    07/26/2022   10:14 AM  In your present state of health, do you have any difficulty performing the following activities:  Hearing? 0  Vision? 0  Difficulty concentrating or making decisions? 1  Walking or climbing stairs? 0  Dressing or bathing? 1  Doing errands, shopping? 1  Preparing Food and eating ? Y  Using the Toilet? N  In the past six months, have you accidently leaked urine? Y  Do you have problems with loss of bowel control? Y  Managing your Medications? Y  Managing your Finances? Y  Housekeeping or managing your Housekeeping? Y    Patient Care Team: Janith Lima, MD as PCP - General (Internal Medicine) [name], [facility] as Pharmacist (Pharmacist)  Indicate any recent Medical Services you may have received from other than [facility] providers in the past year (date may be approximate).     Assessment:   This is a routine wellness examination for Martha Mata.  Hearing/Vision screen Hearing Screening - Comments:: Denies hearing difficulties   Vision Screening - Comments:: Wears rx glasses - up to date with routine eye exams with [place] issues and exercise activities discussed: Current Exercise Habits: Structured exercise class, Type of exercise: Other - see comments (goes to Clinical biochemist at [facility]), Time (Minutes): 60, Frequency (Times/Week): 4, Weekly Exercise (Minutes/Week): 240, Intensity: Mild, Exercise limited by: neurologic condition(s);orthopedic condition(s)   Goals Addressed             This Visit's Progress    Continue to eat healthy and stay hydrated.        Depression Screen    07/26/2022   10:12 AM 07/21/2021    6:02 PM 02/07/2021   10:01 AM 01/28/2020   10:42 AM 04/25/2018    1:34  PM  PHQ 2/9 Scores  PHQ - 2 Score 0 0 0 0 0    Fall Risk    07/26/2022   10:14 AM 07/21/2021    5:59 PM 02/07/2021   10:01 AM 08/26/2019    1:18 PM 04/25/2018    1:34 PM  [place] in the past year? 0 0 0 0 No  Number falls in past yr: 0 0  0   Injury with Fall? 0 0  0   Risk for fall due to : No Fall Risks      Follow up Falls prevention discussed   Falls evaluation completed     FALL RISK PREVENTION PERTAINING TO THE HOME:  Any stairs in or around the home? Yes  If so, are there any without handrails? No  Home free of loose throw rugs in walkways, pet beds, electrical cords, etc? Yes  Adequate lighting in your home to reduce risk of falls? Yes   ASSISTIVE DEVICES UTILIZED TO PREVENT FALLS:  Life alert? Yes  Use of a cane, walker or w/c? Yes  Grab bars in the bathroom? Yes  Shower chair or bench in shower? No  Elevated toilet seat or a handicapped toilet? Yes   TIMED UP AND GO:  Was the test performed? Yes .  Length of time to ambulate 10 feet: 14 sec.   Gait slow and steady without use of assistive device  Cognitive Function:    07/26/2022   10:16 AM 11/30/2021   11:00 AM 11/30/2020   11:00 AM 11/26/2018    9:11 AM 05/27/2017    8:14 AM  MMSE - Mini Mental State Exam  Not completed: Unable to complete Unable to complete Unable to complete    Orientation to time    0 0  Orientation to Place    0 2  Registration    3 3  Attention/ Calculation    0 0  Recall  0 1  Language- name 2 objects    1 2  Language- repeat    1 0  Language- follow 3 step command    2 2  Language- read & follow direction    0 1  Write a sentence    0 0  Copy design    0 0  Total score    7 11        07/26/2022   10:16 AM  6CIT Screen  What Year? 4 points  What month? 3 points  What time? 3 points  Count back from 20 4 points  Months in reverse 4 points  Repeat phrase 10 points  Total Score 28 points    Immunizations Immunization History  Administered Date(s) Administered    Fluad Quad(high Dose 65+) 08/26/2019   Influenza, High Dose Seasonal PF 08/06/2018   Influenza-Unspecified 08/19/2020   PFIZER(Purple Top)SARS-COV-2 Vaccination 12/01/2019, 12/31/2019   Pneumococcal Conjugate-13 04/25/2018   Pneumococcal Polysaccharide-23 04/27/2019   Tdap 04/25/2018, 01/12/2022   Zoster Recombinat (Shingrix) 01/31/2017, 04/04/2017    TDAP status: Up to date  Flu Vaccine status: Completed at today's visit  Pneumococcal vaccine status: Up to date  Covid-19 vaccine status: Completed vaccines  Qualifies for Shingles Vaccine? Yes   Zostavax completed No   Shingrix Completed?: Yes  Screening Tests Health Maintenance  Topic Date Due   OPHTHALMOLOGY EXAM  Never done   DEXA SCAN  Never done   COVID-19 Vaccine (3 - Pfizer series) 02/25/2020   FOOT EXAM  02/07/2022   INFLUENZA VACCINE  06/19/2022   HEMOGLOBIN A1C  10/07/2022   Diabetic kidney evaluation - GFR measurement  04/07/2023   Diabetic kidney evaluation - Urine ACR  04/07/2023   TETANUS/TDAP  01/13/2032   Pneumonia Vaccine 25+ Years old  Completed   Zoster Vaccines- Shingrix  Completed   HPV VACCINES  Aged Out    Health Maintenance  Health Maintenance Due  Topic Date Due   OPHTHALMOLOGY EXAM  Never done   DEXA SCAN  Never done   COVID-19 Vaccine (3 - Pfizer series) 02/25/2020   FOOT EXAM  02/07/2022   INFLUENZA VACCINE  06/19/2022    Colorectal cancer screening: No longer required.   Mammogram status: Completed 09/09/2019. Repeat every year  Bone Density status: never done  Lung Cancer Screening: (Low Dose CT Chest recommended if Age 65-80 years, 30 pack-year currently smoking OR have quit w/in 15years.) does not qualify.   Lung Cancer Screening Referral: no  Additional Screening:  Hepatitis C Screening: does not qualify; Completed no  Vision Screening: Recommended annual ophthalmology exams for early detection of glaucoma and other disorders of the eye. Is the patient up to date with  their annual eye exam?  Yes  Who is the provider or what is the name of the office in which the patient attends annual eye exams? [facility] If pt is not established with a provider, would they like to be referred to a provider to establish care? No .   Dental Screening: Recommended annual dental exams for proper oral hygiene  Community Resource Referral / Chronic Care Management: CRR required this visit?  No   CCM required this visit?  No      Plan:     I have personally reviewed and noted the following in the patient's chart:   Medical and social history Use of alcohol, tobacco or illicit drugs  Current medications and supplements including opioid prescriptions. Patient is not currently taking opioid prescriptions.  Functional ability and status Nutritional status Physical activity Advanced directives List of other physicians Hospitalizations, surgeries, and ER visits in previous 12 months Vitals Screenings to include cognitive, depression, and falls Referrals and appointments  In addition, I have reviewed and discussed with patient certain preventive protocols, quality metrics, and best practice recommendations. A written personalized care plan for preventive services as well as general preventive health recommendations were provided to patient.     Sheral Flow, LPN   01/22/756   Nurse Notes:  Cognitive status assessed by direct observation.  Yes, Patient has current diagnosis of cognitive impairment.  Yes, Patient is followed by neurology for ongoing assessment.  Yes, Patient is unable to complete screening 6CIT or MMSE.

## 2022-09-19 ENCOUNTER — Encounter: Payer: Self-pay | Admitting: Internal Medicine

## 2022-09-20 DIAGNOSIS — Z0289 Encounter for other administrative examinations: Secondary | ICD-10-CM

## 2022-10-15 ENCOUNTER — Ambulatory Visit: Payer: Medicare Other | Admitting: Internal Medicine

## 2022-10-15 ENCOUNTER — Telehealth: Payer: Self-pay | Admitting: Internal Medicine

## 2022-10-15 NOTE — Telephone Encounter (Signed)
I spoke with PT's son today regarding their missed appointment with Dr.Jones. PT's son stated their was miscommunication between him and PT's caregiver and that's why she had missed the appointment.  I got PT scheduled to next available morning slot with Dr.Jones. PT stated that he thought this appointment was just regarding PT's diabetes and was wanting to know if she could just come in and do the labs instead of doing an office visit?

## 2022-10-22 ENCOUNTER — Encounter: Payer: Self-pay | Admitting: Internal Medicine

## 2022-10-22 ENCOUNTER — Other Ambulatory Visit: Payer: Self-pay | Admitting: Internal Medicine

## 2022-10-22 DIAGNOSIS — N1831 Chronic kidney disease, stage 3a: Secondary | ICD-10-CM

## 2022-10-22 DIAGNOSIS — E118 Type 2 diabetes mellitus with unspecified complications: Secondary | ICD-10-CM

## 2022-10-22 MED ORDER — KERENDIA 20 MG PO TABS: 1.0000 | ORAL_TABLET | Freq: Every day | ORAL | 3 refills | Status: DC

## 2022-10-24 ENCOUNTER — Ambulatory Visit: Payer: Medicare Other | Admitting: Podiatry

## 2022-10-24 ENCOUNTER — Encounter: Payer: Self-pay | Admitting: Podiatry

## 2022-10-24 DIAGNOSIS — M79674 Pain in right toe(s): Secondary | ICD-10-CM | POA: Diagnosis not present

## 2022-10-24 DIAGNOSIS — B351 Tinea unguium: Secondary | ICD-10-CM

## 2022-10-24 DIAGNOSIS — M79675 Pain in left toe(s): Secondary | ICD-10-CM | POA: Diagnosis not present

## 2022-10-24 NOTE — Progress Notes (Signed)
Subjective:   Patient ID: Martha Mata, female   DOB: 81 y.o.   MRN: [id number]   HPI Patient presents with caregiver with severely elongated nailbeds 1-5 both feet with patient having dementia.  They are thickened and they are painful when pressed and possible for them to cut and she will not allow them to cut her.  Patient does not smoke is not active   Review of Systems  All other systems reviewed and are negative.       Objective:  Physical Exam Vitals and nursing note reviewed.  Constitutional:      Appearance: She is well-developed.  Pulmonary:     Effort: Pulmonary effort is normal.  Musculoskeletal:        General: Normal range of motion.  Skin:    General: Skin is warm.  Neurological:     Mental Status: She is alert.     Neurovascular status found to be intact muscle strength to be adequate severe elongation nailbeds 1 through 5 both feet thick yellow brittle and painful when I pressed to the area.     Assessment:  Chronic nail infection 1-5 both feet with pain     Plan:  Nail debridement 1-5 both feet no angiogenic bleeding reappoint routine care with H&P done today education to caregiver given

## 2022-10-28 ENCOUNTER — Other Ambulatory Visit: Payer: Self-pay | Admitting: Internal Medicine

## 2022-11-11 ENCOUNTER — Other Ambulatory Visit: Payer: Self-pay | Admitting: Internal Medicine

## 2022-11-11 DIAGNOSIS — E785 Hyperlipidemia, unspecified: Secondary | ICD-10-CM

## 2022-11-20 ENCOUNTER — Telehealth: Payer: Self-pay | Admitting: Family Medicine

## 2022-11-20 DIAGNOSIS — F0390 Unspecified dementia without behavioral disturbance: Secondary | ICD-10-CM

## 2022-11-20 MED ORDER — DONEPEZIL HCL 10 MG PO TABS: 10.0000 mg | ORAL_TABLET | Freq: Every day | ORAL | 0 refills | Status: DC

## 2022-11-20 MED ORDER — MEMANTINE HCL 10 MG PO TABS: 10.0000 mg | ORAL_TABLET | Freq: Two times a day (BID) | ORAL | 0 refills | Status: DC

## 2022-11-20 NOTE — Telephone Encounter (Signed)
Rx filled as per last OV note, "We will continue donepezil '10mg'$  daily at bedtime and memantine '10mg'$  twice daily."

## 2022-11-20 NOTE — Telephone Encounter (Signed)
Pt is requesting a refill for memantine (NAMENDA) 10 MG tablet . & donepezil (ARICEPT) 10 MG tablet   Pharmacy:  [facility]  [facility] requested a [facility] day

## 2022-11-21 ENCOUNTER — Ambulatory Visit (INDEPENDENT_AMBULATORY_CARE_PROVIDER_SITE_OTHER): Payer: Medicare Other | Admitting: Internal Medicine

## 2022-11-21 ENCOUNTER — Encounter: Payer: Self-pay | Admitting: Internal Medicine

## 2022-11-21 VITALS — BP 126/84 | HR 62 | Temp 98.0°F | Resp 16 | Ht 66.0 in | Wt 176.0 lb

## 2022-11-21 DIAGNOSIS — N1831 Chronic kidney disease, stage 3a: Secondary | ICD-10-CM

## 2022-11-21 DIAGNOSIS — E118 Type 2 diabetes mellitus with unspecified complications: Secondary | ICD-10-CM

## 2022-11-21 DIAGNOSIS — Z0000 Encounter for general adult medical examination without abnormal findings: Secondary | ICD-10-CM | POA: Diagnosis not present

## 2022-11-21 DIAGNOSIS — I10 Essential (primary) hypertension: Secondary | ICD-10-CM | POA: Diagnosis not present

## 2022-11-21 DIAGNOSIS — Z0001 Encounter for general adult medical examination with abnormal findings: Secondary | ICD-10-CM

## 2022-11-21 LAB — HEMOGLOBIN A1C: Hgb A1c MFr Bld: 6.8 % — ABNORMAL HIGH (ref 4.6–6.5)

## 2022-11-21 LAB — BASIC METABOLIC PANEL
BUN: 16 mg/dL (ref 6–23)
CO2: 26 mEq/L (ref 19–32)
Calcium: 9.8 mg/dL (ref 8.4–10.5)
Chloride: 110 mEq/L (ref 96–112)
Creatinine, Ser: 1.18 mg/dL (ref 0.40–1.20)
GFR: 43.13 mL/min — ABNORMAL LOW (ref >60.00)
Glucose, Bld: 210 mg/dL — ABNORMAL HIGH (ref 70–99)
Potassium: 3.9 mEq/L (ref 3.5–5.1)
Sodium: 146 mEq/L — ABNORMAL HIGH (ref 135–145)

## 2022-11-21 LAB — CBC WITH DIFFERENTIAL/PLATELET
Basophils Absolute: 0 10*3/uL (ref 0.0–0.1)
Basophils Relative: 0.5 % (ref 0.0–3.0)
Eosinophils Absolute: 0.1 10*3/uL (ref 0.0–0.7)
Eosinophils Relative: 1.9 % (ref 0.0–5.0)
HCT: 37.7 % (ref 36.0–46.0)
Hemoglobin: 12.5 g/dL (ref 12.0–15.0)
Lymphocytes Relative: 22.1 % (ref 12.0–46.0)
Lymphs Abs: 1.4 10*3/uL (ref 0.7–4.0)
MCHC: 33.1 g/dL (ref 30.0–36.0)
MCV: 96.5 fl (ref 78.0–100.0)
Monocytes Absolute: 0.4 10*3/uL (ref 0.1–1.0)
Monocytes Relative: 6.4 % (ref 3.0–12.0)
Neutro Abs: 4.4 10*3/uL (ref 1.4–7.7)
Neutrophils Relative %: 69.1 % (ref 43.0–77.0)
Platelets: 241 10*3/uL (ref 150.0–400.0)
RBC: 3.91 Mil/uL (ref 3.87–5.11)
RDW: 13.7 % (ref 11.5–15.5)
WBC: 6.4 10*3/uL (ref 4.0–10.5)

## 2022-11-21 LAB — TSH: TSH: 1.24 u[IU]/mL (ref 0.35–5.50)

## 2022-11-21 NOTE — Progress Notes (Signed)
Subjective:  Patient ID: Martha Mata, female    DOB: [birth year]  Age: 82 y.o. MRN: [id number]  CC: Annual Exam, Diabetes, Hypertension, and Hyperlipidemia   HPI Lakisa Lotz Weight presents for a CPX and f/up -   She tells me that she feels good but she offers no other comments.  She is with a caretaker today who tells me she is losing weight because she does not eat very much.  Outpatient Medications Prior to Visit  Medication Sig Dispense Refill   Ascorbic Acid (VITAMIN C) 100 MG tablet Take 500 mg by mouth daily. With zinc     Cholecalciferol (VITAMIN D3) 50 MCG (2000 UT) CAPS Take 2,000 Units by mouth daily. Take once a day     cyanocobalamin 500 MCG tablet Take 500 mcg by mouth daily.     donepezil (ARICEPT) 10 MG tablet Take 1 tablet (10 mg total) by mouth daily. 90 tablet 0   Finerenone (KERENDIA) 20 MG TABS Take 1 tablet by mouth daily. 90 tablet 3   memantine (NAMENDA) 10 MG tablet Take 1 tablet (10 mg total) by mouth 2 (two) times daily. 180 tablet 0   metoprolol tartrate (LOPRESSOR) 25 MG tablet TAKE 1/2 TABLET TWICE A DAY BY MOUTH 90 tablet 1   rosuvastatin (CRESTOR) 20 MG tablet TAKE ONE TABLET BY MOUTH EVERYDAY AT BEDTIME 90 tablet 1   No facility-administered medications prior to visit.    ROS Review of Systems  Constitutional:  Positive for unexpected weight change (wt loss).  HENT:  Negative for trouble swallowing.   Eyes: Negative.   Respiratory:  Negative for cough and shortness of breath.   Cardiovascular:  Negative for chest pain, palpitations and leg swelling.  Gastrointestinal:  Negative for abdominal pain, diarrhea, nausea and vomiting.  Endocrine: Negative.   Genitourinary: Negative.  Negative for difficulty urinating.  Skin: Negative.   Neurological:  Negative for dizziness and weakness.  Hematological:  Negative for adenopathy. Does not bruise/bleed easily.  Psychiatric/Behavioral: Negative.      Objective:  BP 126/84 (BP Location: Right Arm,  Patient Position: Sitting, Cuff Size: Large)   Pulse 62   Temp 98 F (36.7 C) (Oral)   Resp 16   Ht '5\' 6"'$  (1.676 m)   Wt 176 lb (79.8 kg)   SpO2 95%   BMI 28.41 kg/m   BP Readings from Last 3 Encounters:  11/21/22 126/84  07/26/22 130/60  07/26/22 130/60    Wt Readings from Last 3 Encounters:  11/21/22 176 lb (79.8 kg)  07/26/22 190 lb (86.2 kg)  07/26/22 190 lb 12.8 oz (86.5 kg)    Physical Exam Vitals reviewed.  HENT:     Nose: Nose normal.     Mouth/Throat:     Mouth: Mucous membranes are moist.  Eyes:     General: No scleral icterus.    Conjunctiva/sclera: Conjunctivae normal.  Cardiovascular:     Rate and Rhythm: Normal rate and regular rhythm.     Heart sounds: No murmur heard. Pulmonary:     Effort: Pulmonary effort is normal.     Breath sounds: No stridor. No wheezing, rhonchi or rales.  Abdominal:     General: Abdomen is flat.     Palpations: There is no mass.     Tenderness: There is no abdominal tenderness. There is no guarding.     Hernia: No hernia is present.  Musculoskeletal:        General: Normal range of motion.  Cervical back: Neck supple.     Right lower leg: No edema.     Left lower leg: No edema.  Lymphadenopathy:     Cervical: No cervical adenopathy.  Skin:    General: Skin is warm and dry.  Neurological:     General: No focal deficit present.     Mental Status: She is alert. Mental status is at baseline.     Lab Results  Component Value Date   WBC 6.4 11/21/2022   HGB 12.5 11/21/2022   HCT 37.7 11/21/2022   PLT 241.0 11/21/2022   GLUCOSE 210 (H) 11/21/2022   CHOL 94 04/06/2022   TRIG 109.0 04/06/2022   HDL 36.30 (L) 04/06/2022   LDLCALC 36 04/06/2022   ALT 18 04/06/2022   AST 20 04/06/2022   NA 146 (H) 11/21/2022   K 3.9 11/21/2022   CL 110 11/21/2022   CREATININE 1.18 11/21/2022   BUN 16 11/21/2022   CO2 26 11/21/2022   TSH 1.24 11/21/2022   INR 1.0 01/12/2022   HGBA1C 6.8 (H) 11/21/2022   MICROALBUR 1.2  04/06/2022    DG Tibia/Fibula Right  Result Date: 01/12/2022 CLINICAL DATA:  Fall with abrasion anterior right lower leg. EXAM: RIGHT TIBIA AND FIBULA - 2 VIEW COMPARISON:  None. FINDINGS: Right total knee arthroplasty intact. Degenerative changes of the patellofemoral joint. No significant joint effusion. 1 cm loose body along the superior aspect of the patellofemoral joint. No evidence of acute fracture or dislocation. Ankle mortise is normal. Inferior calcaneal spur. Mild degenerate change over the midfoot. IMPRESSION: 1. No acute findings. 2. Right total knee arthroplasty intact. Osteoarthritic change of the patellofemoral joint with 1 cm loose body along the superior aspect of the patellofemoral joint. Electronically Signed   By: Marin Olp M.D.   On: 01/12/2022 10:29   CT Head Wo Contrast  Result Date: 01/12/2022 CLINICAL DATA:  82 year old female with history of head trauma. EXAM: CT HEAD WITHOUT CONTRAST CT CERVICAL SPINE WITHOUT CONTRAST TECHNIQUE: Multidetector CT imaging of the head and cervical spine was performed following the standard protocol without intravenous contrast. Multiplanar CT image reconstructions of the cervical spine were also generated. RADIATION DOSE REDUCTION: This exam was performed according to the departmental dose-optimization program which includes automated exposure control, adjustment of the mA and/or kV according to patient size and/or use of iterative reconstruction technique. COMPARISON:  Head CT 10/28/2021.  No prior cervical spine CT. FINDINGS: CT HEAD FINDINGS Brain: Mild cerebral atrophy. Patchy and confluent areas of decreased attenuation are noted throughout the deep and periventricular white matter of the cerebral hemispheres bilaterally, compatible with chronic microvascular ischemic disease. No evidence of acute infarction, hemorrhage, hydrocephalus, extra-axial collection or mass lesion/mass effect. Vascular: No hyperdense vessel or unexpected  calcification. Skull: Normal. Negative for fracture or focal lesion. Sinuses/Orbits: No acute finding. Other: None. CT CERVICAL SPINE FINDINGS Alignment: Mild reversal of normal cervical lordosis, presumably positional. Alignment is otherwise anatomic. Skull base and vertebrae: No acute fracture. No primary bone lesion or focal pathologic process. Soft tissues and spinal canal: No prevertebral fluid or swelling. No visible canal hematoma. Disc levels: Multilevel degenerative disc disease most evident at C4-C5, C5-C6 and C6-C7. Mild multilevel facet arthropathy. Upper chest: Unremarkable. Other: None. IMPRESSION: 1. No evidence of significant acute traumatic injury to the skull, brain or cervical spine. 2. Mild cerebral atrophy with chronic microvascular ischemic changes in the cerebral white matter, as above. 3. Multilevel degenerative disc disease and cervical spondylosis, as above. Electronically Signed  By: Vinnie Langton M.D.   On: 01/12/2022 09:58   CT Cervical Spine Wo Contrast  Result Date: 01/12/2022 CLINICAL DATA:  82 year old female with history of head trauma. EXAM: CT HEAD WITHOUT CONTRAST CT CERVICAL SPINE WITHOUT CONTRAST TECHNIQUE: Multidetector CT imaging of the head and cervical spine was performed following the standard protocol without intravenous contrast. Multiplanar CT image reconstructions of the cervical spine were also generated. RADIATION DOSE REDUCTION: This exam was performed according to the departmental dose-optimization program which includes automated exposure control, adjustment of the mA and/or kV according to patient size and/or use of iterative reconstruction technique. COMPARISON:  Head CT 10/28/2021.  No prior cervical spine CT. FINDINGS: CT HEAD FINDINGS Brain: Mild cerebral atrophy. Patchy and confluent areas of decreased attenuation are noted throughout the deep and periventricular white matter of the cerebral hemispheres bilaterally, compatible with chronic  microvascular ischemic disease. No evidence of acute infarction, hemorrhage, hydrocephalus, extra-axial collection or mass lesion/mass effect. Vascular: No hyperdense vessel or unexpected calcification. Skull: Normal. Negative for fracture or focal lesion. Sinuses/Orbits: No acute finding. Other: None. CT CERVICAL SPINE FINDINGS Alignment: Mild reversal of normal cervical lordosis, presumably positional. Alignment is otherwise anatomic. Skull base and vertebrae: No acute fracture. No primary bone lesion or focal pathologic process. Soft tissues and spinal canal: No prevertebral fluid or swelling. No visible canal hematoma. Disc levels: Multilevel degenerative disc disease most evident at C4-C5, C5-C6 and C6-C7. Mild multilevel facet arthropathy. Upper chest: Unremarkable. Other: None. IMPRESSION: 1. No evidence of significant acute traumatic injury to the skull, brain or cervical spine. 2. Mild cerebral atrophy with chronic microvascular ischemic changes in the cerebral white matter, as above. 3. Multilevel degenerative disc disease and cervical spondylosis, as above. Electronically Signed   By: Vinnie Langton M.D.   On: 01/12/2022 09:58   DG Pelvis Portable  Result Date: 01/12/2022 CLINICAL DATA:  82 year old female status post fall. EXAM: PORTABLE PELVIS 1-2 VIEWS COMPARISON:  None. FINDINGS: The right iliac crest is excluded from the study. There is no evidence of pelvic fracture or diastasis. No pelvic bone lesions are seen. IMPRESSION: No evidence of acute fracture or malalignment. Electronically Signed   By: Ruthann Cancer M.D.   On: 01/12/2022 09:50   DG Chest Port 1 View  Result Date: 01/12/2022 CLINICAL DATA:  82 year old female with history of fall. EXAM: PORTABLE CHEST 1 VIEW COMPARISON:  Chest x-ray 10/28/2021. FINDINGS: Patient is severely rotated to the right limiting the diagnostic sensitivity and specificity of today's examination. With these limitations in mind, lung volumes are low. No  consolidative airspace disease. No pleural effusions. No pneumothorax. No evidence of pulmonary edema. Heart size is enlarged. Large hiatal hernia. The patient is rotated to the right on today's exam, resulting in distortion of the mediastinal contours and reduced diagnostic sensitivity and specificity for mediastinal pathology. IMPRESSION: 1. Low lung volumes without radiographic evidence of acute cardiopulmonary disease. 2. Cardiomegaly. 3. Large hiatal hernia. Electronically Signed   By: Vinnie Langton M.D.   On: 01/12/2022 09:49    Assessment & Plan:   Makena was seen today for annual exam, diabetes, hypertension and hyperlipidemia.  Diagnoses and all orders for this visit:  Stage 3a chronic kidney disease ([place])- Her renal function is stable.  Will continue [place]. -     Basic metabolic panel; Future -     Urinalysis, Routine w reflex microscopic; Future -     Urinalysis, Routine w reflex microscopic -     Basic metabolic panel  Type II diabetes mellitus with manifestations ([place])- Her blood sugar is adequately well-controlled. -     Basic metabolic panel; Future -     Hemoglobin A1c; Future -     HM Diabetes Foot Exam -     Hemoglobin A1c -     Basic metabolic panel  Hypertension, unspecified type- Her blood pressure is adequately well-controlled. -     CBC with Differential/Platelet; Future -     Urinalysis, Routine w reflex microscopic; Future -     TSH; Future -     TSH -     Urinalysis, Routine w reflex microscopic -     CBC with Differential/Platelet  Encounter for general adult medical examination with abnormal findings- Exam completed, labs reviewed, vaccines reviewed and updated, no cancer screenings indicated, patient education was given.   I am having Lucianne J. Zion maintain her cyanocobalamin, vitamin D3, vitamin C, Kerendia, metoprolol tartrate, rosuvastatin, memantine, and donepezil.  No orders of the defined types were placed in this  encounter.    Follow-up: No follow-ups on file.  Scarlette Calico, MD

## 2022-11-29 LAB — URINALYSIS, ROUTINE W REFLEX MICROSCOPIC
Bilirubin Urine: NEGATIVE
Hgb urine dipstick: NEGATIVE
Ketones, ur: NEGATIVE
Leukocytes,Ua: NEGATIVE
Nitrite: NEGATIVE
Specific Gravity, Urine: >=1.03 — AB (ref 1.000–1.030)
Total Protein, Urine: NEGATIVE
Urine Glucose: NEGATIVE
Urobilinogen, UA: 0.2 (ref 0.0–1.0)
pH: 6 (ref 5.0–8.0)

## 2022-11-29 NOTE — Patient Instructions (Signed)
Below is our plan:  We will continue donepezil '10mg'$  at bedtime and memantine '10mg'$  twice daily.   Please make sure you are staying well hydrated. I recommend 50-60 ounces daily. Well balanced diet and regular exercise encouraged. Consistent sleep schedule with 6-8 hours recommended.   Please continue follow up with care team as directed.   Follow up with me in 1 year   You may receive a survey regarding today's visit. I encourage you to leave honest feed back as I do use this information to improve patient care. Thank you for seeing me today!   Management of Memory Problems   There are some general things you can do to help manage your memory problems.  Your memory may not in fact recover, but by using techniques and strategies you will be able to manage your memory difficulties better.   1)  Establish a routine. Try to establish and then stick to a regular routine.  By doing this, you will get used to what to expect and you will reduce the need to rely on your memory.  Also, try to do things at the same time of day, such as taking your medication or checking your calendar first thing in the morning. Think about think that you can do as a part of a regular routine and make a list.  Then enter them into a daily planner to remind you.  This will help you establish a routine.   2)  Organize your environment. Organize your environment so that it is uncluttered.  Decrease visual stimulation.  Place everyday items such as keys or cell phone in the same place every day (ie.  Basket next to front door) Use post it notes with a brief message to yourself (ie. Turn off light, lock the door) Use labels to indicate where things go (ie. Which cupboards are for food, dishes, etc.) Keep a notepad and pen by the telephone to take messages   3)  Memory Aids A diary or journal/notebook/daily planner Making a list (shopping list, chore list, to do list that needs to be done) Using an alarm as a reminder  (kitchen timer or cell phone alarm) Using cell phone to store information (Notes, Calendar, Reminders) Calendar/White board placed in a prominent position Post-it notes   In order for memory aids to be useful, you need to have good habits.  It's no good remembering to make a note in your journal if you don't remember to look in it.  Try setting aside a certain time of day to look in journal.   4)  Improving mood and managing fatigue. There may be other factors that contribute to memory difficulties.  Factors, such as anxiety, depression and tiredness can affect memory. Regular gentle exercise can help improve your mood and give you more energy. Exercise: there are short videos created by the [employer] on Health specially for older adults: https://bit.ly/2I30q97.  Mediterranean diet: which emphasizes fruits, vegetables, whole grains, legumes, fish, and other seafood; unsaturated fats such as olive oils; and low amounts of red meat, eggs, and sweets. A variation of this, called MIND ([place] [employer]entry Intervention for Neurodegenerative Delay) incorporates the DASH (Dietary Approaches to Stop Hypertension) diet, which has been shown to lower high blood pressure, a risk factor for Alzheimer's disease. More information at: RepublicForum.gl.  Aerobic exercise that improve heart health is also good for the mind.  [employer] on Aging have short videos for exercises that you can do at home: GoldCloset.com.ee Simple relaxation  techniques may help relieve symptoms of anxiety Try to get back to completing activities or hobbies you enjoyed doing in the past. Learn to pace yourself through activities to decrease fatigue. Find out about some local support groups where you can share experiences with others. Try and achieve 7-8 hours of sleep at night.   Resources for Family/Caregiver  Online caregiver support  groups can be found at LDLive.be or call Alzheimer's Association's 24/7 hotline: (248) 067-6717. [facility] offers in-person, virtual support groups and individual counseling for both care partners and persons with memory loss. Call for more information at [phone].   Advanced care plan: there are two types of Power of Attorney: healthcare and durable. Healthcare POA is a designated person to make healthcare decisions on your behalf if you were too sick to make them yourself. This person can be selected and documented by your physician. Durable POA has to be set up with a lawyer who takes charge of your finances and estate if you were too sick or cognitively impaired to manage your finances accurately. You can find a local Elder Engineer, mining here: ToyShower.it.  Check out www.planyourlifespan.org, which will help you plan before a crisis and decide who will take care of life considerations in a circumstance where you may not be able to speak for yourself.   Helpful books (available on [employer] or your local bookstore):  By Dr. Army Chaco: Keeping Love Alive as Memories Fade: The 5 Love Languages and the Alzheimer's Journey Aug 20, 2015 The Dementia Care Partner's Workbook: A Guide for Understanding, Education, and [employer] - April 19, 2018.  Both available for less than $15.   "Coping with behavior change in dementia: a family caregiver's guide" by [place] "A Caregiver's Guide to Dementia: Using Activities and Other Strategies to Prevent, Reduce and Manage Behavioral Symptoms" by Osie Bond Gitlin and [employer].  Arts development officer of Joy for the Person with Alzheimer's or Dementia" 4th edition by Vance Peper  Caregiver videos on common behaviors related to dementia: quierodirigir.com  [facility] Caregiver Portal: free to sign up, links to local resources: [email or web address]-caregivers.com/login

## 2022-11-29 NOTE — Progress Notes (Signed)
PATIENT: Martha Mata DOB: [birth year]  REASON FOR VISIT: follow up HISTORY FROM: patient  Chief Complaint  Patient presents with   Room 2    Pt is here with her Son. Pt's son states that everything is still the same with her dementia. Pt's son states no Lutricia Horsfall. Pt's son son states that she doesn't have any behaviors with her dementia.       HISTORY OF PRESENT ILLNESS:  12/04/22  ALL: Sharlena retuns for follow up for dementia. She continues donepezil '10mg'$  QHS and memantine '10mg'$  BID. Son aids in history. Memory seems stable. No behavioral changes. She continues to live alone but has a caregiver, neighbor and family check on her around the clock. They have cameras in the home as well. She has lost about 50lbs. She has started eating healthier foods and limiting processed foods. She is eating normally. No trouble swallowing. She is sleeping well. Gait is stable. No falls. She continues to go to [facility].   11/30/2021 ALL: Maziyah returns for follow up for dementia. She continues donepezil '10mg'$  QHS and memantine '10mg'$  BID. She presents with her son who aids in history. She is doing well. She was hospitalized for altered mental status in setting of dehydration and positive Covid in 10/2021. She has recovered well and now back at home. She does go to [employer] 4 hours a day 4 days a week. She has a caregiver is with her 4 days a week. She has a neighbor that checks on her daily to watch medications. She has another caregiver that comes on the weekends. Her son check on her frequently. She is alone at night but her son has 5 cameras that watch various places in the home to check on her any time. She does needs assistance with ADLs. She does not drive. She does well with routine. No behavioral concerns. She sleeps well. Appetite is normal. No falls.   11/30/2020 ALL:  She returns today for follow-up. She presents with her son who aids in history. He reports that symptoms are stable. No  significant changes since last being seen. She continues Aricept and Namenda and is tolerating well. She does continue to live alone but has a caregiver that comes multiple times a week. Medications are now dosed in a pill pack. She is doing very well with this. She is able to perform ADLs independently. She remains active. She [facility] a [facility]. She has vaccinated and had her booster.  History (copied from previous notes) 11/30/2019 Martha Mata is a 82 y.o. female here today for follow up for dementia. She continues Aricept and Namenda. She is doing well. She continues to live alone. She does have a caregiver who comes 4-5 times a week and stays with her. Her son, who lives in [place], [place], comes on the weekends. She is able to move around the home without difficulty. No falls. No assistive devices. She is eating well. She has gained about 10 pounds since last being seen. This is contributed to being at home more and not being as active. She is able to fix a sandwich or warm foods but does not cook. She now requires assistance with medication administration. There were some concerns of her moving around medications in pill container. Caregiver now assists with administration and medications are secured. She does not drive.  Memory care center is back operating three days a week. Her son feels that, overall,  she is doing very well.    HISTORY: (copied from [place] note on 11/26/2018)  Martha Mata is a 82 year old female with a history of dementia.  She returns today for follow-up.  She remains on Aricept and Namenda.  She currently lives at home.  She does have a caregiver that is with her in the morning that she goes to a adult day center during the day.  She requires assistance with all ADLs.  She no longer prepares any meals but she is able to make sandwiches and heat up food. her family help manage her finances.  The caregiver and her son helps with her  medications.  No significant change in her mood or behavior.  Denies any trouble sleeping.  No hallucinations.  She returns today for evaluation.   HISTORY /05/2018: Here for follow up on dementia. Stable on Namenda and Aricept. She is here with her son. Lives at home with daily aid. No drastic changes. No falls. Patient feels happy and she feels good. Appetite is good. Sleeping is good. Here with son and caretaker. Lives with son and has a caretaker. She goes to a [facility] 3x a week.    05/27/2017: Ms. Martha Mata is a 82 year old female with a history of memory disturbance. She returns today for follow-up. She is here today with her son. She reports that she continues to live at home. She does have caregivers that checks on her frequently. She does stay at home alone at night. She does have a life alert necklace that she can use it needed. Her son denies any episodes of wandering. Patient is able to complete all ADLs independently. Her son reports that there is limited cooking. She does cook bacon and and soups. He denies any trouble using the stove. Her son manages all of her finances. She denies any trouble sleeping. Denies hallucinations. Denies any changes with her mood or behavior. She does use public transportation to go to a connections group. She returns today for an evaluation.    11/26/2016: Ms. Martha Mata is a 82 year old female with a history of memory disturbance. She returns today for follow-up. She is here today with her caretaker and son. They report that her memory has remained stable. She continues to live at home alone. She does have caregivers that check on her throughout the day. She reports that she is able to complete most ADLs independently. She does prepare her own meals without difficulty. Her son helps her with her finances. She does not operate a motor vehicle. Denies any trouble sleeping. Denies getting up at night wandering around the house. Denies hallucinations. Denies  restlessness or agitation. Overall she feels that she is doing well. Family agrees with this. She is on Aricept and tolerating it well. She returns today for an evaluation.   HPI:  Martha Mata is a 82 y.o. female here as a referral from Dr. Carlis Abbott for memory loss. Here with caretaker who provides information. She is here as a follow up.  Memory changes started about 3 years ago. Slowly progressive, slowly getting worse. Started with short-term memory problems, repeating conversations. Caretaker says she loses her keys, she had to put a key rack in her room, she forgets things she is told, asks the same questions over and over again, forgets conversations, she cooks quick things doesn't cook big meals anymore. Caretaker is there a few hours a day. She lives alone in a condo. Caretaker does the driving, no accidents in the home,  no falls. Son keep a camera in the house to watch her and make sure she is safe, he lives in [place]. Discussed assisted living close to son, recommended that she look into it. No hallucinations, agitation, no issues at all. No depression, no behavioral problems, she is still very social, goes to the [employer] weekly. Son has taken over finances and caring for the house in the last few years. She takes OTC b12. TSH has been normal in the past.    Reviewed notes, labs and imaging from outside physicians, which showed:   CBC nml, CMP with creatinine is 0.86 normal labs were taken 03/06/2016, LDL 104. TSH in the past has been normal. Patient takes daily B12 supplementation so we'll not check B12.   MRI of the brain 09/07/2013 (personally reviewed imaging and agree with the following):   Mildly abnormal MRI brain (without) demonstrating: 1. Few scattered foci of non-specific gliosis in the subcortical and juxtacortical white matter.   2. Single left occipital punctate focus of SWI hypointensity, may represent a cerebral microhemorrhage. 3. Above findings may be related to  underlying mild chronic small vessel ischemic disease.   REVIEW OF SYSTEMS: Out of a complete 14 system review of symptoms, the patient complains only of the following symptoms, none and all other reviewed systems are negative.  ALLERGIES: Allergies  Allergen Reactions   Farxiga [Dapagliflozin] Diarrhea    rash    HOME MEDICATIONS: Outpatient Medications Prior to Visit  Medication Sig Dispense Refill   Ascorbic Acid (VITAMIN C) 100 MG tablet Take 500 mg by mouth daily. With zinc     Cholecalciferol (VITAMIN D3) 50 MCG (2000 UT) CAPS Take 2,000 Units by mouth daily. Take once a day     cyanocobalamin 500 MCG tablet Take 500 mcg by mouth daily.     Finerenone (KERENDIA) 20 MG TABS Take 1 tablet by mouth daily. 90 tablet 3   metoprolol tartrate (LOPRESSOR) 25 MG tablet TAKE 1/2 TABLET TWICE A DAY BY MOUTH 90 tablet 1   rosuvastatin (CRESTOR) 20 MG tablet TAKE ONE TABLET BY MOUTH EVERYDAY AT BEDTIME 90 tablet 1   donepezil (ARICEPT) 10 MG tablet Take 1 tablet (10 mg total) by mouth daily. 90 tablet 0   memantine (NAMENDA) 10 MG tablet Take 1 tablet (10 mg total) by mouth 2 (two) times daily. 180 tablet 0   No facility-administered medications prior to visit.    PAST MEDICAL HISTORY: Past Medical History:  Diagnosis Date   Diabetes mellitus without complication (HCC)    Fibroid, uterine    GERD (gastroesophageal reflux disease)    no meds   Hypertension    Memory loss of unknown cause    per pt's admission   Vitamin D deficiency disease     PAST SURGICAL HISTORY: Past Surgical History:  Procedure Laterality Date   CERVIX LESION DESTRUCTION     COLONOSCOPY  2005   DILATATION & CURRETTAGE/HYSTEROSCOPY WITH RESECTOCOPE N/A 08/14/2013   Procedure: DILATATION & CURETTAGE/HYSTEROSCOPY WITH RESECTOCOPE;  Surgeon: Marvene Staff, MD;  Location: [facility];  Service: Gynecology;  Laterality: N/A;   DILATION AND CURETTAGE OF UTERUS     HYSTEROSCOPY     KNEE ARTHROCENTESIS  2010    TUBAL LIGATION     UTERINE FIBROID SURGERY  2005    FAMILY HISTORY: Family History  Problem Relation Age of Onset   Diabetes Son     SOCIAL HISTORY: Social History   Socioeconomic History   Marital status:  Widowed    Spouse name: Not on file   Number of children: 2   Years of education: 12+   Highest education level: Not on file  Occupational History   Occupation: Retired  Tobacco Use   Smoking status: Never   Smokeless tobacco: Never  Vaping Use   Vaping Use: Never used  Substance and Sexual Activity   Alcohol use: Never    Comment: occasionally,one glass of wine monthly   Drug use: No   Sexual activity: Not Currently  Other Topics Concern   Not on file  Social History Narrative   Patient is retired.    Patient is a non-smoker    Patient lives at home alone. Has a caretaker who comes in to help as needed.   Patient consumes tea and coffee (1-2 cups daily)   Patient is right handed.   Patient has a college education.   Patient has two children.   Social Determinants of Health   Financial Resource Strain: Low Risk  (07/26/2022)   Overall Financial Resource Strain (CARDIA)    Difficulty of Paying Living Expenses: Not hard at all  Food Insecurity: No Food Insecurity (07/26/2022)   Hunger Vital Sign    Worried About Running Out of Food in the Last Year: Never true    Ran Out of Food in the Last Year: Never true  Transportation Needs: No Transportation Needs (07/26/2022)   PRAPARE - Hydrologist (Medical): No    Lack of Transportation (Non-Medical): No  Physical Activity: Sufficiently Active (07/26/2022)   Exercise Vital Sign    Days of Exercise per Week: 4 days    Minutes of Exercise per Session: 60 min  Stress: No Stress Concern Present (07/26/2022)   [place]    Feeling of Stress : Not at all  Social Connections: [place] (07/26/2022)   Social Connection and  Isolation Panel [NHANES]    Frequency of Communication with Friends and Family: More than three times a week    Frequency of Social Gatherings with Friends and Family: More than three times a week    Attends Religious Services: More than 4 times per year    Active Member of [employer] or Organizations: Yes    Attends Archivist Meetings: More than 4 times per year    Marital Status: Married  Human resources officer Violence: Not At Risk (07/26/2022)   Humiliation, Afraid, Rape, and Kick questionnaire    Fear of Current or Ex-Partner: No    Emotionally Abused: No    Physically Abused: No    Sexually Abused: No      PHYSICAL EXAM  Vitals:   12/04/22 0949  BP: (!) 123/57  Pulse: 60  Weight: 179 lb 8 oz (81.4 kg)  Height: '5\' 6"'$  (1.676 m)     Body mass index is 28.97 kg/m.  Generalized: Well developed, in no acute distress  Cardiology: normal rate and rhythm, no murmur noted Respiratory: clear to auscultation bilaterally  Neurological examination  Mentation: Alert, not oriented to time, place, or history taking. Follows intermittent commands speech and language fluent, very little speech, today  Cranial nerve II-XII: Pupils were equal round reactive to light. Extraocular movements were full, visual field were full on confrontational test. Facial sensation and strength were normal.  Motor: The motor testing reveals 5 over 5 strength of all 4 extremities. Good symmetric motor tone is noted throughout.  Sensory: unable to fully assess  but patient denies pain when touching extremities  Coordination: unable to complete Gait and station: Gait is short but stable    DIAGNOSTIC DATA (LABS, IMAGING, TESTING) - I reviewed patient records, labs, notes, testing and imaging myself where available.     07/26/2022   10:16 AM 11/30/2021   11:00 AM 11/30/2020   11:00 AM  MMSE - Mini Mental State Exam  Not completed: Unable to complete Unable to complete Unable to complete     Lab Results   Component Value Date   WBC 6.4 11/21/2022   HGB 12.5 11/21/2022   HCT 37.7 11/21/2022   MCV 96.5 11/21/2022   PLT 241.0 11/21/2022      Component Value Date/Time   NA 146 (H) 11/21/2022 1010   K 3.9 11/21/2022 1010   CL 110 11/21/2022 1010   CO2 26 11/21/2022 1010   GLUCOSE 210 (H) 11/21/2022 1010   BUN 16 11/21/2022 1010   CREATININE 1.18 11/21/2022 1010   CALCIUM 9.8 11/21/2022 1010   PROT 7.7 04/06/2022 1020   ALBUMIN 4.0 04/06/2022 1020   AST 20 04/06/2022 1020   ALT 18 04/06/2022 1020   ALKPHOS 65 04/06/2022 1020   BILITOT 0.4 04/06/2022 1020   GFRNONAA 54 (L) 01/12/2022 1057   GFRAA 55 (L) 08/22/2017 0850   Lab Results  Component Value Date   CHOL 94 04/06/2022   HDL 36.30 (L) 04/06/2022   LDLCALC 36 04/06/2022   TRIG 109.0 04/06/2022   CHOLHDL 3 04/06/2022   Lab Results  Component Value Date   HGBA1C 6.8 (H) 11/21/2022   Lab Results  Component Value Date   VITAMINB12 >1999 (H) 08/11/2013   Lab Results  Component Value Date   TSH 1.24 11/21/2022       ASSESSMENT AND PLAN 82 y.o. year old female  has a past medical history of Diabetes mellitus without complication ([place]), Fibroid, uterine, GERD (gastroesophageal reflux disease), Hypertension, Memory loss of unknown cause, and Vitamin D deficiency disease. here with     ICD-10-CM   1. Dementia without behavioral disturbance (HCC)  F03.90 donepezil (ARICEPT) 10 MG tablet    memantine (NAMENDA) 10 MG tablet       Overall, Channell is doing well. We will continue Aricpet and Namenda.Unable to complete MMSE.  I have reviewed progression of disease with her son. We have discussed safety precautions. She will continue close follow up with PCP. She will follow up with me in 1 year, sooner if needed. Her son verbalizes understanding and agreement with this plan.    No orders of the defined types were placed in this encounter.    Meds ordered this encounter  Medications   donepezil (ARICEPT) 10 MG tablet     Sig: Take 1 tablet (10 mg total) by mouth daily.    Dispense:  90 tablet    Refill:  3    Order Specific Question:   Supervising Provider    Answer:   Melvenia Beam [[id number]]   memantine (NAMENDA) 10 MG tablet    Sig: Take 1 tablet (10 mg total) by mouth 2 (two) times daily.    Dispense:  180 tablet    Refill:  3    Order Specific Question:   Supervising Provider    Answer:   Bess Harvest, FNP-C 12/04/2022, 10:34 AM [facility] [address], [place], [place] [address] [phone]

## 2022-12-03 ENCOUNTER — Ambulatory Visit: Payer: Medicare Other | Admitting: Family Medicine

## 2022-12-04 ENCOUNTER — Encounter: Payer: Self-pay | Admitting: Family Medicine

## 2022-12-04 ENCOUNTER — Ambulatory Visit: Payer: Medicare Other | Admitting: Family Medicine

## 2022-12-04 VITALS — BP 123/57 | HR 60 | Ht 66.0 in | Wt 179.5 lb

## 2022-12-04 DIAGNOSIS — F0390 Unspecified dementia without behavioral disturbance: Secondary | ICD-10-CM

## 2022-12-04 MED ORDER — MEMANTINE HCL 10 MG PO TABS: 10.0000 mg | ORAL_TABLET | Freq: Two times a day (BID) | ORAL | 3 refills | Status: DC

## 2022-12-04 MED ORDER — DONEPEZIL HCL 10 MG PO TABS: 10.0000 mg | ORAL_TABLET | Freq: Every day | ORAL | 3 refills | Status: DC

## 2022-12-27 IMAGING — DX DG CHEST 2V
2 series · 2 of 2 positions shown · non-contrast
Comparison: Chest radiographs 08/22/2017 and earlier.

CLINICAL DATA: 79-year-old female with shortness of breath,
wheezing and cough.

EXAM:
CHEST - 2 VIEW

[chest ap]
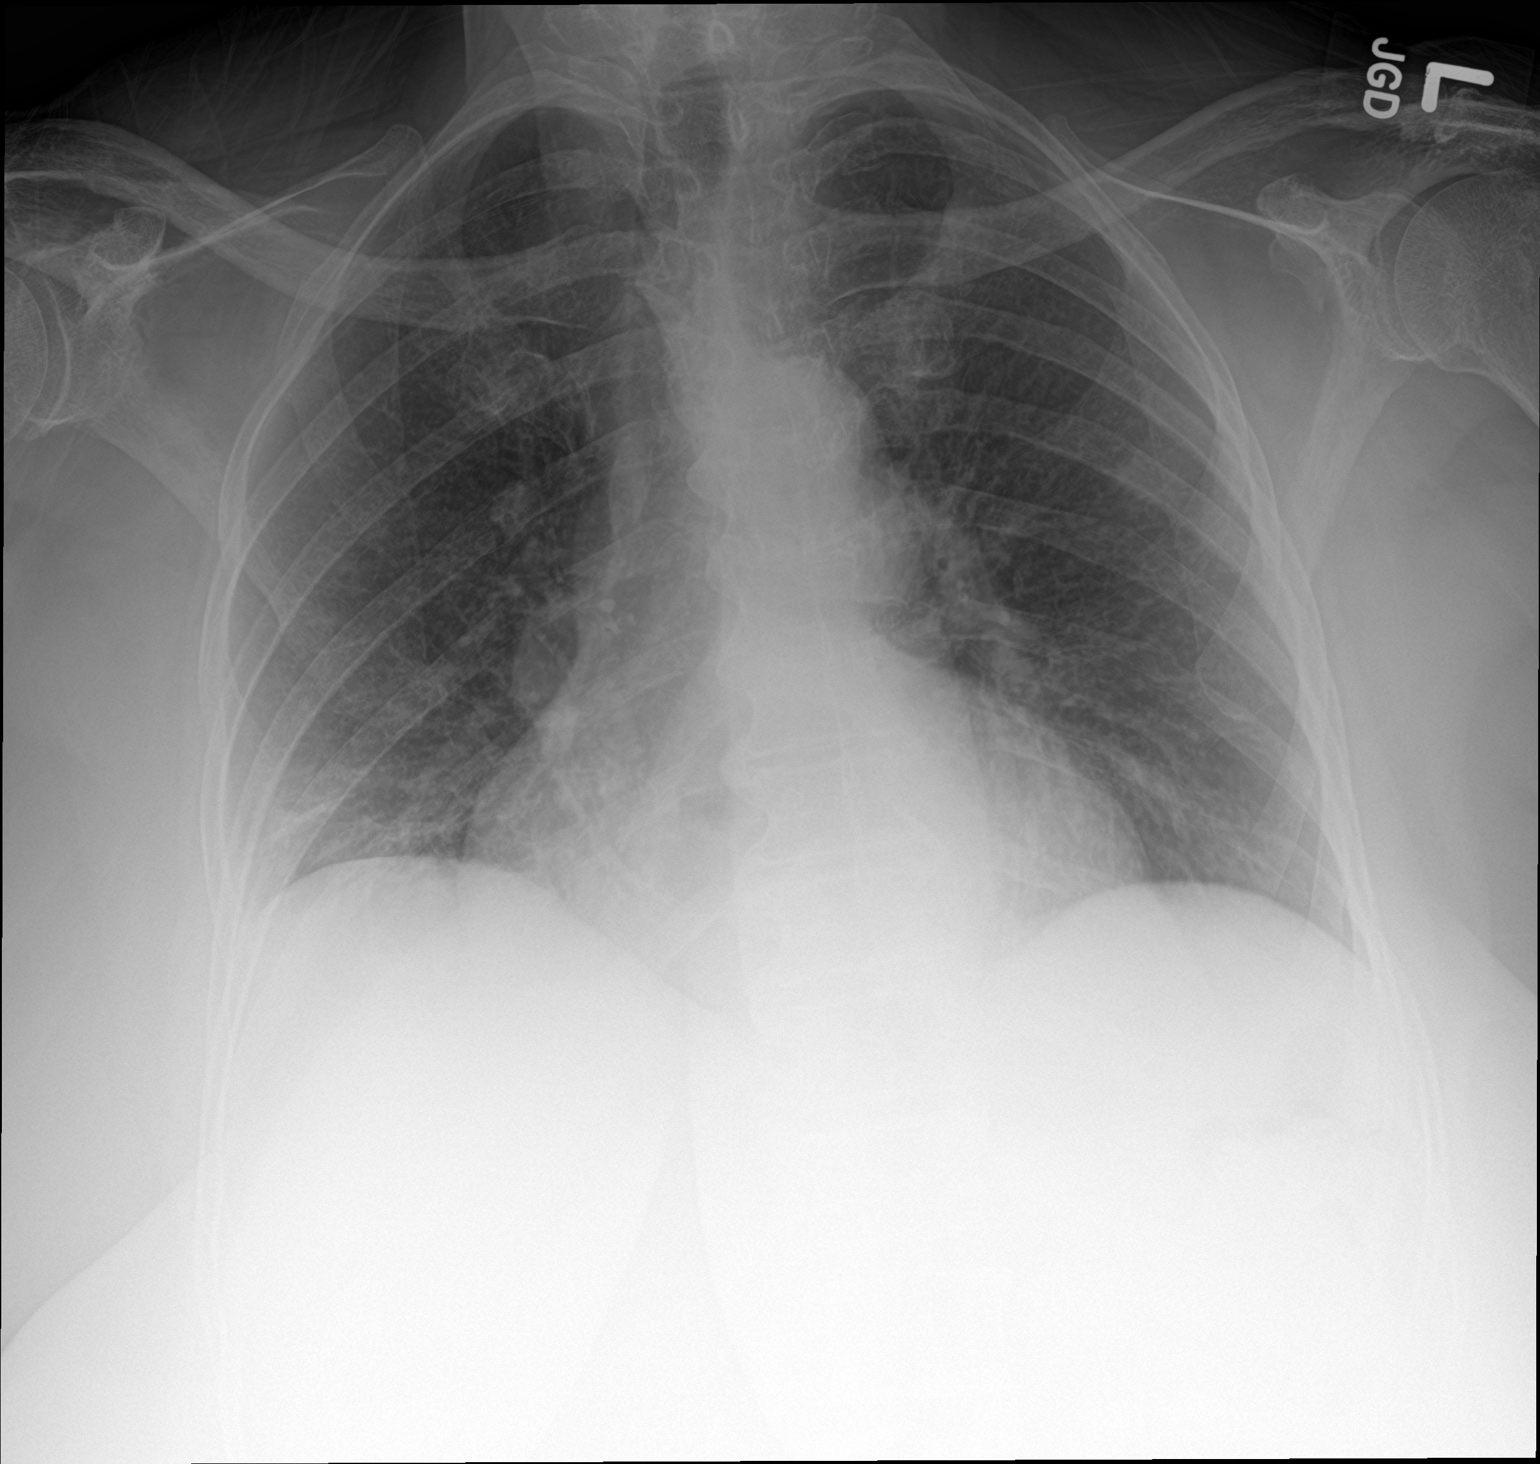

[chest lat]
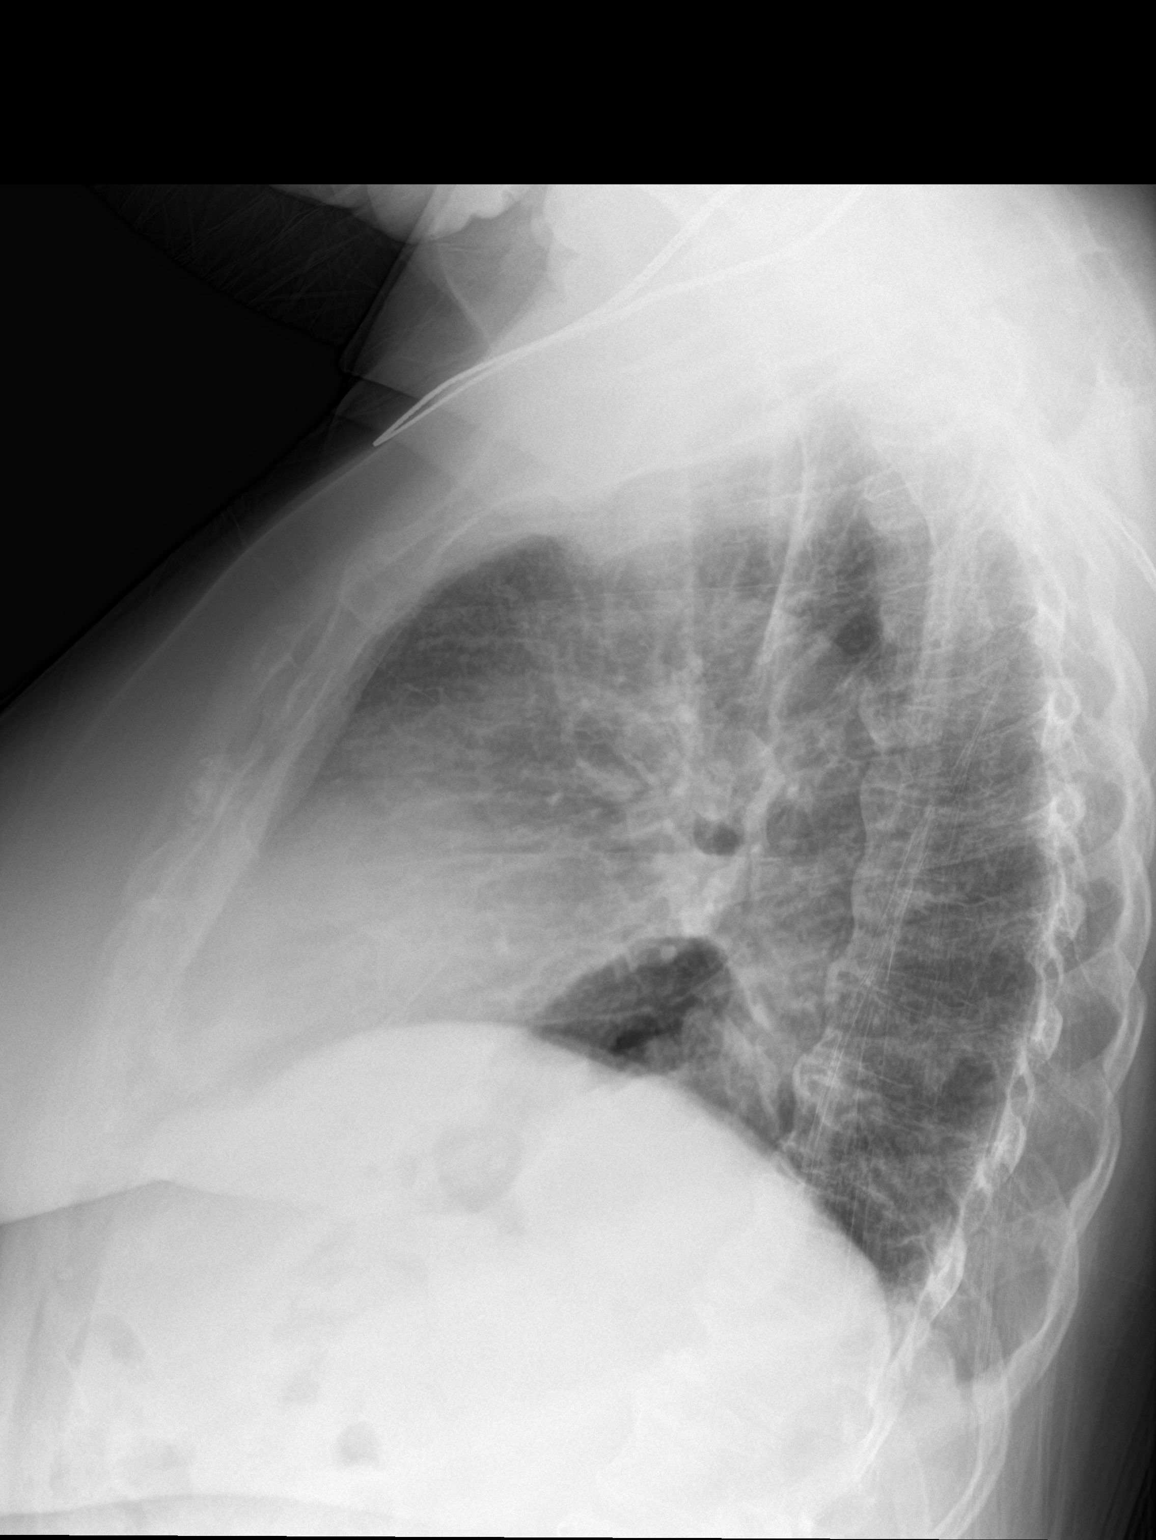

[2 of 2 positions shown; findings below may reference images not displayed]

FINDINGS: Lower lung volumes today with patchy asymmetric opacity at the right
lung base. Superimposed small to moderate gastric hiatal hernia.
Other mediastinal contours are within normal limits. Visualized
tracheal air column is within normal limits. No pneumothorax,
pulmonary edema, pleural effusion or other confluent pulmonary
opacity. No air bronchograms.

No acute osseous abnormality identified. Negative visible bowel gas
pattern.
IMPRESSION: 1. Lower lung volumes with patchy asymmetric opacity at the right
lung base, nonspecific but suspicious for developing infection in
this setting.
If infection is suspected then Followup PA and lateral chest X-ray
is recommended in 3-4 weeks following trial of antibiotic therapy to
ensure resolution.
2. Moderate chronic hiatal hernia.

## 2023-02-17 ENCOUNTER — Encounter: Payer: Self-pay | Admitting: Internal Medicine

## 2023-04-21 ENCOUNTER — Other Ambulatory Visit: Payer: Self-pay | Admitting: Internal Medicine

## 2023-05-02 ENCOUNTER — Encounter: Payer: Self-pay | Admitting: Internal Medicine

## 2023-05-15 ENCOUNTER — Other Ambulatory Visit: Payer: Self-pay | Admitting: Internal Medicine

## 2023-05-15 DIAGNOSIS — E785 Hyperlipidemia, unspecified: Secondary | ICD-10-CM

## 2023-07-18 ENCOUNTER — Ambulatory Visit (INDEPENDENT_AMBULATORY_CARE_PROVIDER_SITE_OTHER): Payer: Medicare Other

## 2023-07-18 VITALS — Ht 66.0 in | Wt 179.0 lb

## 2023-07-18 DIAGNOSIS — Z0000 Encounter for general adult medical examination without abnormal findings: Secondary | ICD-10-CM | POA: Diagnosis not present

## 2023-07-18 NOTE — Patient Instructions (Addendum)
Ms. Bedinger , Thank you for taking time to come for your Medicare Wellness Visit. I appreciate your ongoing commitment to your health goals. Please review the following plan we discussed and let me know if I can assist you in the future.   Referrals/Orders/Follow-Ups/Clinician Recommendations: no  This is a list of the screening recommended for you and due dates:  Health Maintenance  Topic Date Due   Eye exam for diabetics  Never done   DEXA scan (bone density measurement)  Never done   COVID-19 Vaccine (3 - 2023-24 season) 07/20/2022   Yearly kidney health urinalysis for diabetes  04/07/2023   Hemoglobin A1C  05/22/2023   Flu Shot  06/20/2023   Yearly kidney function blood test for diabetes  11/22/2023   Complete foot exam   11/22/2023   Medicare Annual Wellness Visit  07/17/2024   DTaP/Tdap/Td vaccine (3 - Td or Tdap) 01/13/2032   Pneumonia Vaccine  Completed   Zoster (Shingles) Vaccine  Completed   HPV Vaccine  Aged Out    Advanced directives: (Copy Requested) Please bring a copy of your health care power of attorney and living will to the office to be added to your chart at your convenience.  Next Medicare Annual Wellness Visit scheduled for next year: Yes  Preventive Care attachment FALL PREVENTION attachment

## 2023-07-18 NOTE — Progress Notes (Signed)
Subjective:   Martha Mata is a 82 y.o. female who presents for Medicare Annual (Subsequent) preventive examination.  Visit Complete: Virtual  I connected with  Destini Tokarz Juday on 07/18/23 by a audio enabled telemedicine application and verified that I am speaking with the correct person using two identifiers.  Patient Location: Home  Provider Location: Office/Clinic  I discussed the limitations of evaluation and management by telemedicine. The patient expressed understanding and agreed to proceed.  Vital Signs: Because this visit was a virtual/telehealth visit, some criteria may be missing or patient reported. Any vitals not documented were not able to be obtained and vitals that have been documented are patient reported. Son assisted with phone visit due to patient's medical diagnosis.    Review of Systems     Cardiac Risk Factors include: advanced age (>26men, >40 women);diabetes mellitus;dyslipidemia;hypertension     Objective:    Today's Vitals   07/18/23 1603  Weight: 179 lb (81.2 kg)  Height: 5\' 6"  (1.676 m)  PainSc: 0-No pain   Body mass index is 28.89 kg/m.     07/18/2023    4:31 PM 07/26/2022   10:13 AM 10/28/2021   12:35 PM 07/21/2021    6:02 PM 08/22/2017    8:38 AM 08/14/2013   11:39 AM 07/22/2013    6:12 AM  Advanced Directives  Does Patient Have a Medical Advance Directive? Yes Yes No Yes No Patient does not have advance directive Patient does not have advance directive  Type of Advance Directive Healthcare Power of [place];Living will Healthcare Power of [place];Living will  Healthcare Power of Attorney     Does patient want to make changes to medical advance directive?    Yes (ED - Information included in AVS)     Copy of Healthcare Power of Attorney in Chart? No - copy requested No - copy requested  Yes - validated most recent copy scanned in chart (See row information)     Would patient like information on creating a medical advance directive?     No  - Patient declined    Pre-existing out of facility DNR order (yellow form or pink MOST form)      No     Current Medications (verified) Outpatient Encounter Medications as of 07/18/2023  Medication Sig   Ascorbic Acid (VITAMIN C) 100 MG tablet Take 500 mg by mouth daily. With zinc   Cholecalciferol (VITAMIN D3) 50 MCG (2000 UT) CAPS Take 2,000 Units by mouth daily. Take once a day   cyanocobalamin 500 MCG tablet Take 500 mcg by mouth daily.   donepezil (ARICEPT) 10 MG tablet Take 1 tablet (10 mg total) by mouth daily.   Finerenone (KERENDIA) 20 MG TABS Take 1 tablet by mouth daily.   memantine (NAMENDA) 10 MG tablet Take 1 tablet (10 mg total) by mouth 2 (two) times daily.   metoprolol tartrate (LOPRESSOR) 25 MG tablet TAKE 1/2 TABLET TWICE A DAY BY MOUTH   rosuvastatin (CRESTOR) 20 MG tablet TAKE ONE TABLET BY MOUTH EVERYDAY AT BEDTIME   No facility-administered encounter medications on file as of 07/18/2023.    Allergies (verified) Farxiga [dapagliflozin]   History: Past Medical History:  Diagnosis Date   Diabetes mellitus without complication (HCC)    Fibroid, uterine    GERD (gastroesophageal reflux disease)    no meds   Hypertension    Memory loss of unknown cause    per pt's admission   Vitamin D deficiency disease    Past Surgical  History:  Procedure Laterality Date   CERVIX LESION DESTRUCTION     COLONOSCOPY  2005   DILATATION & CURRETTAGE/HYSTEROSCOPY WITH RESECTOCOPE N/A 08/14/2013   Procedure: DILATATION & CURETTAGE/HYSTEROSCOPY WITH RESECTOCOPE;  Surgeon: Serita Kyle, MD;  Location: [facility];  Service: Gynecology;  Laterality: N/A;   DILATION AND CURETTAGE OF UTERUS     HYSTEROSCOPY     KNEE ARTHROCENTESIS  2010   TUBAL LIGATION     UTERINE FIBROID SURGERY  2005   Family History  Problem Relation Age of Onset   Diabetes Son    Social History   Socioeconomic History   Marital status: Widowed    Spouse name: Not on file   Number of children: 2    Years of education: 12+   Highest education level: Not on file  Occupational History   Occupation: Retired  Tobacco Use   Smoking status: Never   Smokeless tobacco: Never  Vaping Use   Vaping status: Never Used  Substance and Sexual Activity   Alcohol use: Never    Comment: occasionally,one glass of wine monthly   Drug use: No   Sexual activity: Not Currently  Other Topics Concern   Not on file  Social History Narrative   Patient is retired.    Patient is a non-smoker    Patient lives with son due to dementia. Has a caretaker who comes in to help as needed.   Patient consumes tea and coffee (1-2 cups daily)   Patient is right handed.   Patient has a college education.   Patient has two children.   Social Determinants of Health   Financial Resource Strain: Low Risk  (07/18/2023)   Overall Financial Resource Strain (CARDIA)    Difficulty of Paying Living Expenses: Not hard at all  Food Insecurity: No Food Insecurity (07/18/2023)   Hunger Vital Sign    Worried About Running Out of Food in the Last Year: Never true    Ran Out of Food in the Last Year: Never true  Transportation Needs: No Transportation Needs (07/18/2023)   PRAPARE - Administrator, Civil Service (Medical): No    Lack of Transportation (Non-Medical): No  Physical Activity: Sufficiently Active (07/18/2023)   Exercise Vital Sign    Days of Exercise per Week: 4 days    Minutes of Exercise per Session: 60 min  Stress: No Stress Concern Present (07/18/2023)   [employer] of Occupational Health - Occupational Stress Questionnaire    Feeling of Stress : Not at all  Social Connections: Socially Integrated (07/18/2023)   Social Connection and Isolation Panel [NHANES]    Frequency of Communication with Friends and Family: More than three times a week    Frequency of Social Gatherings with Friends and Family: More than three times a week    Attends Religious Services: More than 4 times per year    Active  Member of [employer] or Organizations: Yes    Attends Engineer, structural: More than 4 times per year    Marital Status: Married    Tobacco Counseling Counseling given: Not Answered   Clinical Intake:  Pre-visit preparation completed: Yes  Pain : No/denies pain Pain Score: 0-No pain     BMI - recorded: 28.89 Nutritional Status: BMI 25 -29 Overweight Nutritional Risks: None Diabetes: No  How often do you need to have someone help you when you read instructions, pamphlets, or other written materials from your doctor or pharmacy?: 1 - Never What is  the last grade level you completed in school?: HSG; some college  Interpreter Needed?: No  Information entered by :: Savoy [place] N. Ona Roehrs, LPN.   Activities of Daily Living    07/18/2023    4:32 PM 07/26/2022   10:14 AM  In your present state of health, do you have any difficulty performing the following activities:  Hearing? 0 0  Vision? 0 0  Difficulty concentrating or making decisions? 1 1  Walking or climbing stairs? 0 0  Dressing or bathing? 1 1  Doing errands, shopping? 1 1  Preparing Food and eating ? Y Y  Comment preparing food   Using the Toilet? Y N  In the past six months, have you accidently leaked urine? Y Y  Do you have problems with loss of bowel control? Y Y  Managing your Medications? Y Y  Managing your Finances? Malvin Johns  Housekeeping or managing your Housekeeping? Malvin Johns    Patient Care Team: Etta Grandchild, MD as PCP - General (Internal Medicine) Kathyrn Sheriff, [facility] (Inactive) as Pharmacist (Pharmacist)  Indicate any recent Medical Services you may have received from other than [facility] providers in the past year (date may be approximate).     Assessment:   This is a routine wellness examination for Martha Mata.  Hearing/Vision screen Hearing Screening - Comments:: Patient denied any hearing difficulty.   No hearing aids.  Vision Screening - Comments:: Patient does wear corrective lenses/contacts.   Annual eye exam done by: [facility]   Dietary issues and exercise activities discussed:     Goals Addressed   None   Depression Screen    07/18/2023    4:25 PM 07/26/2022   10:12 AM 07/21/2021    6:02 PM 02/07/2021   10:01 AM 01/28/2020   10:42 AM 04/25/2018    1:34 PM  PHQ 2/9 Scores  PHQ - 2 Score 0 0 0 0 0 0  PHQ- 9 Score 3         Fall Risk    07/18/2023    4:31 PM 07/26/2022   10:14 AM 07/21/2021    5:59 PM 02/07/2021   10:01 AM 08/26/2019    1:18 PM  Fall Risk   Falls in the past year? 0 0 0 0 0  Number falls in past yr: 0 0 0  0  Injury with Fall? 0 0 0  0  Risk for fall due to : No Fall Risks No Fall Risks     Follow up Falls prevention discussed Falls prevention discussed   Falls evaluation completed    MEDICARE RISK AT HOME: Medicare Risk at Home Any stairs in or around the home?: Yes If so, are there any without handrails?: No Home free of loose throw rugs in walkways, pet beds, electrical cords, etc?: Yes Adequate lighting in your home to reduce risk of falls?: Yes Life alert?: Yes Use of a cane, walker or w/c?: Yes Grab bars in the bathroom?: Yes Shower chair or bench in shower?: No Elevated toilet seat or a handicapped toilet?: Yes  TIMED UP AND GO:  Was the test performed?  No    Cognitive Function:    07/18/2023    4:33 PM 07/26/2022   10:16 AM 11/30/2021   11:00 AM 11/30/2020   11:00 AM 11/26/2018    9:11 AM  MMSE - Mini Mental State Exam  Not completed: Unable to complete Unable to complete Unable to complete Unable to complete --  Orientation to time  0  Orientation to Place     0  Registration     3  Attention/ Calculation     0  Recall     0  Language- name 2 objects     1  Language- repeat     1  Language- follow 3 step command     2  Language- read & follow direction     0  Write a sentence     0  Copy design     0  Total score     7        07/26/2022   10:16 AM  6CIT Screen  What Year? 4 points  What month? 3 points   What time? 3 points  Count back from 20 4 points  Months in reverse 4 points  Repeat phrase 10 points  Total Score 28 points    Immunizations Immunization History  Administered Date(s) Administered   Fluad Quad(high Dose 65+) 08/26/2019, 07/26/2022   Influenza, High Dose Seasonal PF 08/06/2018   Influenza-Unspecified 08/19/2020   PFIZER(Purple Top)SARS-COV-2 Vaccination 12/01/2019, 12/31/2019   Pneumococcal Conjugate-13 04/25/2018   Pneumococcal Polysaccharide-23 04/27/2019   Tdap 04/25/2018, 01/12/2022   Zoster Recombinant(Shingrix) 01/31/2017, 04/04/2017    TDAP status: Up to date  Flu Vaccine status: Up to date  Pneumococcal vaccine status: Up to date  Covid-19 vaccine status: Completed vaccines  Qualifies for Shingles Vaccine? Yes   Zostavax completed No   Shingrix Completed?: Yes  Screening Tests Health Maintenance  Topic Date Due   OPHTHALMOLOGY EXAM  Never done   DEXA SCAN  Never done   COVID-19 Vaccine (3 - 2023-24 season) 07/20/2022   Diabetic kidney evaluation - Urine ACR  04/07/2023   HEMOGLOBIN A1C  05/22/2023   INFLUENZA VACCINE  06/20/2023   Diabetic kidney evaluation - eGFR measurement  11/22/2023   FOOT EXAM  11/22/2023   Medicare Annual Wellness (AWV)  07/17/2024   DTaP/Tdap/Td (3 - Td or Tdap) 01/13/2032   Pneumonia Vaccine 10+ Years old  Completed   Zoster Vaccines- Shingrix  Completed   HPV VACCINES  Aged Out    Health Maintenance  Health Maintenance Due  Topic Date Due   OPHTHALMOLOGY EXAM  Never done   DEXA SCAN  Never done   COVID-19 Vaccine (3 - 2023-24 season) 07/20/2022   Diabetic kidney evaluation - Urine ACR  04/07/2023   HEMOGLOBIN A1C  05/22/2023   INFLUENZA VACCINE  06/20/2023    Colorectal cancer screening: No longer required.   Mammogram status: No longer required due to age.  Bone Density status: No longer required due to age.  Lung Cancer Screening: (Low Dose CT Chest recommended if Age 59-80 years, 20 pack-year  currently smoking OR have quit w/in 15years.) does not qualify.   Lung Cancer Screening Referral: no  Additional Screening:  Hepatitis C Screening: does not qualify; Completed no  Vision Screening: Recommended annual ophthalmology exams for early detection of glaucoma and other disorders of the eye. Is the patient up to date with their annual eye exam?  Yes  Who is the provider or what is the name of the office in which the patient attends annual eye exams? [facility] If pt is not established with a provider, would they like to be referred to a provider to establish care? No .   Dental Screening: Recommended annual dental exams for proper oral hygiene  Diabetic Foot Exam: Diabetic Foot Exam: Completed 11/21/2022  Community Resource Referral / Chronic  Care Management: CRR required this visit?  No   CCM required this visit?  No     Plan:     I have personally reviewed and noted the following in the patient's chart:   Medical and social history Use of alcohol, tobacco or illicit drugs  Current medications and supplements including opioid prescriptions. Patient is not currently taking opioid prescriptions. Functional ability and status Nutritional status Physical activity Advanced directives List of other physicians Hospitalizations, surgeries, and ER visits in previous 12 months Vitals Screenings to include cognitive, depression, and falls Referrals and appointments  In addition, I have reviewed and discussed with patient certain preventive protocols, quality metrics, and best practice recommendations. A written personalized care plan for preventive services as well as general preventive health recommendations were provided to patient.     Mickeal Needy, LPN   1/61/0960   After Visit Summary: (Mail) Due to this being a telephonic visit, the after visit summary with patients personalized plan was offered to patient via mail   Nurse Notes: Patient has  current diagnosis of cognitive impairment. Patient is followed by neurology for ongoing assessment. Patient is unable to complete screening 6CIT or MMSE. Son assisted with phone visit due to patient's medical diagnosis.

## 2023-07-29 ENCOUNTER — Encounter: Payer: Self-pay | Admitting: Internal Medicine

## 2023-07-29 DIAGNOSIS — Z0279 Encounter for issue of other medical certificate: Secondary | ICD-10-CM

## 2023-07-29 NOTE — Telephone Encounter (Signed)
Form has been printed and placed on PCPs desk.

## 2023-10-02 ENCOUNTER — Encounter: Payer: Self-pay | Admitting: Internal Medicine

## 2023-10-08 ENCOUNTER — Other Ambulatory Visit: Payer: Self-pay | Admitting: Internal Medicine

## 2023-11-05 ENCOUNTER — Other Ambulatory Visit: Payer: Self-pay | Admitting: Internal Medicine

## 2023-11-05 ENCOUNTER — Telehealth: Payer: Self-pay | Admitting: Internal Medicine

## 2023-11-05 DIAGNOSIS — E785 Hyperlipidemia, unspecified: Secondary | ICD-10-CM

## 2023-11-05 NOTE — Telephone Encounter (Signed)
Marlana Latus called from [facility] wanting to speak with the nurse about the pt. Best call back number is [phone].

## 2023-11-06 NOTE — Telephone Encounter (Signed)
May you please ma a note that you do know the status of her dementia but you do not want her living alone. And what her level of needs are so that when social services send over the fax we can have that in her records.

## 2023-12-02 NOTE — Progress Notes (Signed)
 PATIENT: Martha Mata DOB: December 31, 1940  REASON FOR VISIT: follow up HISTORY FROM: patient  Chief Complaint  Patient presents with   Room 1    Pt is here with her son and caretaker. Pt's son states that pt has declined some since her last appointment. Pt's son states that pt is having delays when someone is giving commands for her to do something. Pt's son states that pt isn't having Austin Bjork issues and that Martha Mata is sleeping throughout the night. Pt's son states that Martha Mata is eating well.       HISTORY OF PRESENT ILLNESS:  12/03/23  ALL: Martha Mata returns for follow up for dementia. Martha Mata continues donepezil  10mg  QHS and memantine  10mg  BID. No significant changes. Martha Mata is full assist. Pleasantly confused. Martha Mata does attend [facility] regularly. Martha Mata is sleeping well. Appetite is good. Martha Mata is able to walk up and down stairs at home. Lives in second story condo. Martha Mata lives alone. Maceo, caregiver, is with her Monday through Friday. Son lives beside her. They have cameras in the home. No behavioral changes. Martha Mata does not drive.   12/04/2022 ALL:  Martha Mata retuns for follow up for dementia. Martha Mata continues donepezil  10mg  QHS and memantine  10mg  BID. Son aids in history. Memory seems stable. No behavioral changes. Martha Mata continues to live alone but has a caregiver, neighbor and family check on her around the clock. They have cameras in the home as well. Martha Mata has lost about 50lbs. Martha Mata has started eating healthier foods and limiting processed foods. Martha Mata is eating normally. No trouble swallowing. Martha Mata is sleeping well. Gait is stable. No falls. Martha Mata continues to go to [facility].   11/30/2021 ALL: Martha Mata returns for follow up for dementia. Martha Mata continues donepezil  10mg  QHS and memantine  10mg  BID. Martha Mata presents with her son who aids in history. Martha Mata is doing well. Martha Mata was hospitalized for altered mental status in setting of dehydration and positive Covid in 10/2021. Martha Mata has recovered well and now back at home. Martha Mata does go  to [facility] 4 hours a day 4 days a week. Martha Mata has a caregiver is with her 4 days a week. Martha Mata has a neighbor that checks on her daily to watch medications. Martha Mata has another caregiver that comes on the weekends. Her son check on her frequently. Martha Mata is alone at night but her son has 5 cameras that watch various places in the home to check on her any time. Martha Mata does needs assistance with ADLs. Martha Mata does not drive. Martha Mata does well with routine. No behavioral concerns. Martha Mata sleeps well. Appetite is normal. No falls.   11/30/2020 ALL:  Martha Mata returns today for follow-up. Martha Mata presents with her son who aids in history. He reports that symptoms are stable. No significant changes since last being seen. Martha Mata continues Aricept  and Namenda  and is tolerating well. Martha Mata does continue to live alone but has a caregiver that comes multiple times a week. Medications are now dosed in a pill pack. Martha Mata is doing very well with this. Martha Mata is able to perform ADLs independently. Martha Mata remains active. [facility]. Martha Mata has vaccinated and had her booster.  History (copied from previous notes) 11/30/2019 Martha Mata is a 83 y.o. female here today for follow up for dementia. Martha Mata continues Aricept  and Namenda . Martha Mata is doing well. Martha Mata continues to live alone. Martha Mata does have a caregiver who comes 4-5 times a week and stays with her. Her son,  who lives in [place], [place], comes on the weekends. Martha Mata is able to move around the home without difficulty. No falls. No assistive devices. Martha Mata is eating well. Martha Mata has gained about 10 pounds since last being seen. This is contributed to being at home more and not being as active. Martha Mata is able to fix a sandwich or warm foods but does not cook. Martha Mata now requires assistance with medication administration. There were some concerns of her moving around medications in pill container. Caregiver now assists with administration and medications are secured. Martha Mata does not drive.   Memory care center is back operating three days a week. Her son feels that, overall, Martha Mata is doing very well.    HISTORY: (copied from Megan Millikan's note on 11/26/2018)  Martha Mata is a 83 year old female with a history of dementia.  Martha Mata returns today for follow-up.  Martha Mata remains on Aricept  and Namenda .  Martha Mata currently lives at home.  Martha Mata does have a caregiver that is with her in the morning that Martha Mata goes to a adult day center during the day.  Martha Mata requires assistance with all ADLs.  Martha Mata no longer prepares any meals but Martha Mata is able to make sandwiches and heat up food. her family help manage her finances.  The caregiver and her son helps with her medications.  No significant change in her mood or behavior.  Denies any trouble sleeping.  No hallucinations.  Martha Mata returns today for evaluation.   HISTORY /05/2018: Here for follow up on dementia. Stable on Namenda  and Aricept . Martha Mata is here with her son. Lives at home with daily aid. No drastic changes. No falls. Patient feels happy and Martha Mata feels good. Appetite is good. Sleeping is good. Here with son and caretaker. Lives with son and has a caretaker. Martha Mata goes to a neurosurgeon 3x a week.    05/27/2017: Martha Mata is a 83 year old female with a history of memory disturbance. Martha Mata returns today for follow-up. Martha Mata is here today with her son. Martha Mata reports that Martha Mata continues to live at home. Martha Mata does have caregivers that checks on her frequently. Martha Mata does stay at home alone at night. Martha Mata does have a life alert necklace that Martha Mata can use it needed. Her son denies any episodes of wandering. Patient is able to complete all ADLs independently. Her son reports that there is limited cooking. Martha Mata does cook bacon and and soups. He denies any trouble using the stove. Her son manages all of her finances. Martha Mata denies any trouble sleeping. Denies hallucinations. Denies any changes with her mood or behavior. Martha Mata does use public transportation to go to a connections group. Martha Mata  returns today for an evaluation.    11/26/2016: Martha Mata is a 83 year old female with a history of memory disturbance. Martha Mata returns today for follow-up. Martha Mata is here today with her caretaker and son. They report that her memory has remained stable. Martha Mata continues to live at home alone. Martha Mata does have caregivers that check on her throughout the day. Martha Mata reports that Martha Mata is able to complete most ADLs independently. Martha Mata does prepare her own meals without difficulty. Her son helps her with her finances. Martha Mata does not operate a motor vehicle. Denies any trouble sleeping. Denies getting up at night wandering around the house. Denies hallucinations. Denies restlessness or agitation. Overall Martha Mata feels that Martha Mata is doing well. Family agrees with this. Martha Mata is on Aricept  and tolerating it well. Martha Mata returns today for an evaluation.   HPI:  Martha  LIARA Mata is a 83 y.o. female here as a referral from Dr. Gretta for memory loss. Here with caretaker who provides information. Martha Mata is here as a follow up.  Memory changes started about 3 years ago. Slowly progressive, slowly getting worse. Started with short-term memory problems, repeating conversations. Caretaker says Martha Mata loses her keys, Martha Mata had to put a key rack in her room, Martha Mata forgets things Martha Mata is told, asks the same questions over and over again, forgets conversations, Martha Mata cooks quick things doesn't cook big meals anymore. Caretaker is there a few hours a day. Martha Mata lives alone in a condo. Caretaker does the driving, no accidents in the home, no falls. Son keep a camera in the house to watch her and make sure Martha Mata is safe, he lives in [place] . Discussed assisted living close to son, recommended that Martha Mata look into it. No hallucinations, agitation, no issues at all. No depression, no behavioral problems, Martha Mata is still very social, goes to the [facility] weekly. Son has taken over finances and caring for the house in the last few years. Martha Mata takes OTC b12. TSH has been normal in  the past.    Reviewed notes, labs and imaging from outside physicians, which showed:   CBC nml, CMP with creatinine is 0.86 normal labs were taken 03/06/2016, LDL 104. TSH in the past has been normal. Patient takes daily B12 supplementation so we'll not check B12.   MRI of the brain 09/07/2013 (personally reviewed imaging and agree with the following):   Mildly abnormal MRI brain (without) demonstrating: 1. Few scattered foci of non-specific gliosis in the subcortical and juxtacortical white matter.   2. Single left occipital punctate focus of SWI hypointensity, may represent a cerebral microhemorrhage. 3. Above findings may be related to underlying mild chronic small vessel ischemic disease.   REVIEW OF SYSTEMS: Out of a complete 14 system review of symptoms, the patient complains only of the following symptoms, memory loss and all other reviewed systems are negative.   ALLERGIES: Allergies  Allergen Reactions   Farxiga  [Dapagliflozin ] Diarrhea    rash    HOME MEDICATIONS: Outpatient Medications Prior to Visit  Medication Sig Dispense Refill   Ascorbic Acid  (VITAMIN C) 100 MG tablet Take 500 mg by mouth daily. With zinc      Cholecalciferol (VITAMIN D3) 50 MCG (2000 UT) CAPS Take 2,000 Units by mouth daily. Take once a day     cyanocobalamin 500 MCG tablet Take 500 mcg by mouth daily.     Finerenone  (KERENDIA ) 20 MG TABS Take 1 tablet by mouth daily. 90 tablet 3   metoprolol  tartrate (LOPRESSOR ) 25 MG tablet TAKE 1/2 TABLET TWICE A DAY BY MOUTH 90 tablet 1   rosuvastatin  (CRESTOR ) 20 MG tablet TAKE 1 TABLET EACH NIGHT AT BEDTIME. 90 tablet 0   Zinc  100 MG TABS Take by mouth.     donepezil  (ARICEPT ) 10 MG tablet Take 1 tablet (10 mg total) by mouth daily. 90 tablet 3   memantine  (NAMENDA ) 10 MG tablet Take 1 tablet (10 mg total) by mouth 2 (two) times daily. 180 tablet 3   No facility-administered medications prior to visit.    PAST MEDICAL HISTORY: Past Medical History:   Diagnosis Date   Diabetes mellitus without complication (HCC)    Fibroid, uterine    GERD (gastroesophageal reflux disease)    no meds   Hypertension    Memory loss of unknown cause    per pt's admission   Vitamin D  deficiency  disease     PAST SURGICAL HISTORY: Past Surgical History:  Procedure Laterality Date   CERVIX LESION DESTRUCTION     COLONOSCOPY  2005   DILATATION & CURRETTAGE/HYSTEROSCOPY WITH RESECTOCOPE N/A 08/14/2013   Procedure: DILATATION & CURETTAGE/HYSTEROSCOPY WITH RESECTOCOPE;  Surgeon: Dickie DELENA Carder, MD;  Location: [facility];  Service: Gynecology;  Laterality: N/A;   DILATION AND CURETTAGE OF UTERUS     HYSTEROSCOPY     KNEE ARTHROCENTESIS  2010   TUBAL LIGATION     UTERINE FIBROID SURGERY  2005    FAMILY HISTORY: Family History  Problem Relation Age of Onset   Diabetes Son     SOCIAL HISTORY: Social History   Socioeconomic History   Marital status: Widowed    Spouse name: Not on file   Number of children: 2   Years of education: 12+   Highest education level: Not on file  Occupational History   Occupation: Retired  Tobacco Use   Smoking status: Never   Smokeless tobacco: Never  Vaping Use   Vaping status: Never Used  Substance and Sexual Activity   Alcohol use: Never    Comment: occasionally,one glass of wine monthly   Drug use: No   Sexual activity: Not Currently  Other Topics Concern   Not on file  Social History Narrative   Patient is retired.    Patient is a non-smoker    Patient lives with son due to dementia. Has a caretaker who comes in to help as needed.   Patient consumes tea and coffee (1-2 cups daily)   Patient is right handed.   Patient has a college education.   Patient has two children.   Social Drivers of Corporate Investment Banker Strain: Low Risk  (07/18/2023)   Overall Financial Resource Strain (CARDIA)    Difficulty of Paying Living Expenses: Not hard at all  Food Insecurity: No Food Insecurity (07/18/2023)    Hunger Vital Sign    Worried About Running Out of Food in the Last Year: Never true    Ran Out of Food in the Last Year: Never true  Transportation Needs: No Transportation Needs (07/18/2023)   PRAPARE - Administrator, Civil Service (Medical): No    Lack of Transportation (Non-Medical): No  Physical Activity: Sufficiently Active (07/18/2023)   Exercise Vital Sign    Days of Exercise per Week: 4 days    Minutes of Exercise per Session: 60 min  Stress: No Stress Concern Present (07/18/2023)   [employer] of Occupational Health - Occupational Stress Questionnaire    Feeling of Stress : Not at all  Social Connections: Socially Integrated (07/18/2023)   Social Connection and Isolation Panel [NHANES]    Frequency of Communication with Friends and Family: More than three times a week    Frequency of Social Gatherings with Friends and Family: More than three times a week    Attends Religious Services: More than 4 times per year    Active Member of [employer] or Organizations: Yes    Attends Banker Meetings: More than 4 times per year    Marital Status: Married  Catering Manager Violence: Not At Risk (07/18/2023)   Humiliation, Afraid, Rape, and Kick questionnaire    Fear of Current or Ex-Partner: No    Emotionally Abused: No    Physically Abused: No    Sexually Abused: No      PHYSICAL EXAM  Vitals:   12/03/23 0952  BP: 112/68  Pulse: ROLLEN)  53  Weight: 162 lb 8 oz (73.7 kg)  Height: 5' 6 (1.676 m)      Body mass index is 26.23 kg/m.  Generalized: Well developed, in no acute distress  Cardiology: normal rate and rhythm, no murmur noted Respiratory: clear to auscultation bilaterally  Neurological examination  Mentation: Alert, not oriented to time, place, or history taking. Follows intermittent commands speech and language fluent, very little speech, today  Cranial nerve II-XII: Pupils were equal round reactive to light. Extraocular movements were  full, visual field were full on confrontational test. Facial sensation and strength were normal.  Motor: The motor testing reveals 5 over 5 strength of all 4 extremities. Good symmetric motor tone is noted throughout.  Sensory: unable to fully assess but patient denies pain when touching extremities  Coordination: unable to complete Gait and station: needs assistance to stand, Gait is short but stable , hold onto her son, no assistive device.   DIAGNOSTIC DATA (LABS, IMAGING, TESTING) - I reviewed patient records, labs, notes, testing and imaging myself where available.     07/18/2023    4:33 PM 07/26/2022   10:16 AM 11/30/2021   11:00 AM  MMSE - Mini Mental State Exam  Not completed: Unable to complete Unable to complete Unable to complete     Lab Results  Component Value Date   WBC 6.4 11/21/2022   HGB 12.5 11/21/2022   HCT 37.7 11/21/2022   MCV 96.5 11/21/2022   PLT 241.0 11/21/2022      Component Value Date/Time   NA 146 (H) 11/21/2022 1010   K 3.9 11/21/2022 1010   CL 110 11/21/2022 1010   CO2 26 11/21/2022 1010   GLUCOSE 210 (H) 11/21/2022 1010   BUN 16 11/21/2022 1010   CREATININE 1.18 11/21/2022 1010   CALCIUM  9.8 11/21/2022 1010   PROT 7.7 04/06/2022 1020   ALBUMIN 4.0 04/06/2022 1020   AST 20 04/06/2022 1020   ALT 18 04/06/2022 1020   ALKPHOS 65 04/06/2022 1020   BILITOT 0.4 04/06/2022 1020   GFRNONAA 54 (L) 01/12/2022 1057   GFRAA 55 (L) 08/22/2017 0850   Lab Results  Component Value Date   CHOL 94 04/06/2022   HDL 36.30 (L) 04/06/2022   LDLCALC 36 04/06/2022   TRIG 109.0 04/06/2022   CHOLHDL 3 04/06/2022   Lab Results  Component Value Date   HGBA1C 6.8 (H) 11/21/2022   Lab Results  Component Value Date   VITAMINB12 >1999 (H) 08/11/2013   Lab Results  Component Value Date   TSH 1.24 11/21/2022       ASSESSMENT AND PLAN 83 y.o. year old female  has a past medical history of Diabetes mellitus without complication (HCC), Fibroid, uterine, GERD  (gastroesophageal reflux disease), Hypertension, Memory loss of unknown cause, and Vitamin D  deficiency disease. here with     ICD-10-CM   1. Dementia without behavioral disturbance (HCC)  F03.90 memantine  (NAMENDA ) 10 MG tablet    donepezil  (ARICEPT ) 10 MG tablet      Overall, Martha Mata is doing well. We will continue Aricpet and Namenda .Unable to complete MMSE.  I have reviewed progression of disease with her son. We have discussed safety precautions. Martha Mata will continue close follow up with PCP. Martha Mata will follow up with me in 1 year, sooner if needed. Her son verbalizes understanding and agreement with this plan.    No orders of the defined types were placed in this encounter.    Meds ordered this encounter  Medications  memantine  (NAMENDA ) 10 MG tablet    Sig: Take 1 tablet (10 mg total) by mouth 2 (two) times daily.    Dispense:  180 tablet    Refill:  3    Supervising Provider:   AHERN, ANTONIA B [[id number]]   donepezil  (ARICEPT ) 10 MG tablet    Sig: Take 1 tablet (10 mg total) by mouth daily.    Dispense:  90 tablet    Refill:  3    Supervising Provider:   AHERN, ANTONIA B [id number]      I spent 30 minutes of face-to-face and non-face-to-face time with patient.  This included previsit chart review, lab review, study review, order entry, electronic health record documentation, patient education.   Greig Forbes, FNP-C 12/03/2023, 12:44 PM [facility] [address], [address] [place], [place] [address] [phone]

## 2023-12-02 NOTE — Patient Instructions (Signed)
 Below is our plan:  We will continue donepezil  10mg  QHS and memantine  10mg  BID.   Please make sure you are staying well hydrated. I recommend 50-60 ounces daily. Well balanced diet and regular exercise encouraged. Consistent sleep schedule with 6-8 hours recommended.   Please continue follow up with care team as directed.   Follow up with me in 1 year   You may receive a survey regarding today's visit. I encourage you to leave honest feed back as I do use this information to improve patient care. Thank you for seeing me today!   Management of Memory Problems   There are some general things you can do to help manage your memory problems.  Your memory may not in fact recover, but by using techniques and strategies you will be able to manage your memory difficulties better.   1)  Establish a routine. Try to establish and then stick to a regular routine.  By doing this, you will get used to what to expect and you will reduce the need to rely on your memory.  Also, try to do things at the same time of day, such as taking your medication or checking your calendar first thing in the morning. Think about think that you can do as a part of a regular routine and make a list.  Then enter them into a daily planner to remind you.  This will help you establish a routine.   2)  Organize your environment. Organize your environment so that it is uncluttered.  Decrease visual stimulation.  Place everyday items such as keys or cell phone in the same place every day (ie.  Basket next to front door) Use post it notes with a brief message to yourself (ie. Turn off light, lock the door) Use labels to indicate where things go (ie. Which cupboards are for food, dishes, etc.) Keep a notepad and pen by the telephone to take messages   3)  Memory Aids A diary or journal/notebook/daily planner Making a list (shopping list, chore list, to do list that needs to be done) Using an alarm as a reminder (kitchen timer or  cell phone alarm) Using cell phone to store information (Notes, Calendar, Reminders) Calendar/White board placed in a prominent position Post-it notes   In order for memory aids to be useful, you need to have good habits.  It's no good remembering to make a note in your journal if you don't remember to look in it.  Try setting aside a certain time of day to look in journal.   4)  Improving mood and managing fatigue. There may be other factors that contribute to memory difficulties.  Factors, such as anxiety, depression and tiredness can affect memory. Regular gentle exercise can help improve your mood and give you more energy. Exercise: there are short videos created by the [employer] on Health specially for older adults: https://bit.ly/2I30q97.  Mediterranean diet: which emphasizes fruits, vegetables, whole grains, legumes, fish, and other seafood; unsaturated fats such as olive oils; and low amounts of red meat, eggs, and sweets. A variation of this, called MIND (Mediterranean-DASH Intervention for Neurodegenerative Delay) incorporates the DASH (Dietary Approaches to Stop Hypertension) diet, which has been shown to lower high blood pressure, a risk factor for Alzheimer's disease. More information at: exitmarketing.de.  Aerobic exercise that improve heart health is also good for the mind.  [employer] on Aging have short videos for exercises that you can do at home: Blindworkshop.com.pt Simple relaxation techniques may  help relieve symptoms of anxiety Try to get back to completing activities or hobbies you enjoyed doing in the past. Learn to pace yourself through activities to decrease fatigue. Find out about some local support groups where you can share experiences with others. Try and achieve 7-8 hours of sleep at night.   Resources for Family/Caregiver  Online caregiver support groups can be found  at westerntunes.it or call Alzheimer's Association's 24/7 hotline: (405)342-3547. [facility] offers in-person, virtual support groups and individual counseling for both care partners and persons with memory loss. Call for more information at ([phone])554-2903.   Advanced care plan: there are two types of Power of Attorney: healthcare and durable. Healthcare POA is a designated person to make healthcare decisions on your behalf if you were too sick to make them yourself. This person can be selected and documented by your physician. Durable POA has to be set up with a lawyer who takes charge of your finances and estate if you were too sick or cognitively impaired to manage your finances accurately. You can find a local Elder Therapist, art here: newportranch.at.  Check out www.planyourlifespan.org, which will help you plan before a crisis and decide who will take care of life considerations in a circumstance where you may not be able to speak for yourself.   Helpful books (available on [employer] or your local bookstore):  By Dr. Katherene Gentry: Keeping Love Alive as Memories Fade: The 5 Love Languages and the Alzheimer's Journey Aug 20, 2015 The Dementia Care Partner's Workbook: A Guide for Understanding, Education, and [employer] - April 19, 2018.  Both available for less than $15.   Coping with behavior change in dementia: a family caregiver's guide by Landry Mirza & Mitzie Pizza A Caregiver's Guide to Dementia: Using Activities and Other Strategies to Prevent, Reduce and Manage Behavioral Symptoms by Leita SAILOR. [place] and Catherine Piersol.  Creating Moments of Joy for the Person with Alzheimer's or Dementia 4th edition by Melanie Mings  Caregiver videos on common behaviors related to dementia: populationgame.pl  [place] Caregiver Portal: free to sign up, links to local resources: [email or web address]

## 2023-12-03 ENCOUNTER — Encounter: Payer: Self-pay | Admitting: Family Medicine

## 2023-12-03 ENCOUNTER — Ambulatory Visit: Payer: Medicare Other | Admitting: Family Medicine

## 2023-12-03 VITALS — BP 112/68 | HR 53 | Ht 66.0 in | Wt 162.5 lb

## 2023-12-03 DIAGNOSIS — F0390 Unspecified dementia without behavioral disturbance: Secondary | ICD-10-CM

## 2023-12-03 MED ORDER — DONEPEZIL HCL 10 MG PO TABS: 10.0000 mg | ORAL_TABLET | Freq: Every day | ORAL | 3 refills | Status: DC

## 2023-12-03 MED ORDER — MEMANTINE HCL 10 MG PO TABS: 10.0000 mg | ORAL_TABLET | Freq: Two times a day (BID) | ORAL | 3 refills | Status: DC

## 2024-01-31 ENCOUNTER — Other Ambulatory Visit: Payer: Self-pay | Admitting: Internal Medicine

## 2024-01-31 DIAGNOSIS — E785 Hyperlipidemia, unspecified: Secondary | ICD-10-CM

## 2024-04-05 ENCOUNTER — Other Ambulatory Visit: Payer: Self-pay | Admitting: Internal Medicine

## 2024-04-08 ENCOUNTER — Encounter: Payer: Self-pay | Admitting: Internal Medicine

## 2024-04-09 ENCOUNTER — Other Ambulatory Visit: Payer: Self-pay

## 2024-04-09 DIAGNOSIS — I10 Essential (primary) hypertension: Secondary | ICD-10-CM

## 2024-04-09 MED ORDER — METOPROLOL TARTRATE 25 MG PO TABS: 12.5000 mg | ORAL_TABLET | Freq: Two times a day (BID) | ORAL | 1 refills | Status: DC

## 2024-04-28 ENCOUNTER — Other Ambulatory Visit: Payer: Self-pay | Admitting: Internal Medicine

## 2024-04-28 DIAGNOSIS — E785 Hyperlipidemia, unspecified: Secondary | ICD-10-CM

## 2024-05-01 ENCOUNTER — Ambulatory Visit: Admitting: Podiatry

## 2024-05-01 ENCOUNTER — Encounter: Payer: Self-pay | Admitting: Podiatry

## 2024-05-01 DIAGNOSIS — L84 Corns and callosities: Secondary | ICD-10-CM

## 2024-05-01 DIAGNOSIS — E1149 Type 2 diabetes mellitus with other diabetic neurological complication: Secondary | ICD-10-CM

## 2024-05-01 DIAGNOSIS — M79675 Pain in left toe(s): Secondary | ICD-10-CM | POA: Diagnosis not present

## 2024-05-01 DIAGNOSIS — B351 Tinea unguium: Secondary | ICD-10-CM

## 2024-05-01 DIAGNOSIS — M79674 Pain in right toe(s): Secondary | ICD-10-CM

## 2024-05-01 DIAGNOSIS — E1140 Type 2 diabetes mellitus with diabetic neuropathy, unspecified: Secondary | ICD-10-CM | POA: Diagnosis not present

## 2024-05-01 NOTE — Progress Notes (Signed)
 Subjective:   Patient ID: Martha Mata, female   DOB: 83 y.o.   MRN: [id number]   HPI Patient presents with caregiver with severe elongation of nailbeds 1-5 both feet painful lesion left fifth metatarsal right fifth metatarsal and diabetes that they try to control with patient having Alzheimer's   ROS      Objective:  Physical Exam  Neurovascular status moderately diminished with the patient found to have severe elongation nailbeds 1-5 both feet incurvated sore and also noted to have lesions fifth metatarsal bilateral with moderate diminishment of neurological status consistent with her diabetes     Assessment:  Severe thickness with painful lesions bilateral severe nail disease incurvation 1-5 both feet     Plan:  Debridement of lesions bilateral and debridement of nailbeds 1-5 both feet no iatrogenic bleeding reappoint routine care

## 2024-05-06 ENCOUNTER — Ambulatory Visit: Admitting: Internal Medicine

## 2024-05-06 ENCOUNTER — Ambulatory Visit: Payer: Self-pay | Admitting: Internal Medicine

## 2024-05-06 VITALS — BP 116/60 | HR 54 | Temp 98.1°F | Resp 16 | Ht 66.0 in | Wt 152.0 lb

## 2024-05-06 DIAGNOSIS — E785 Hyperlipidemia, unspecified: Secondary | ICD-10-CM

## 2024-05-06 DIAGNOSIS — I10 Essential (primary) hypertension: Secondary | ICD-10-CM | POA: Diagnosis not present

## 2024-05-06 DIAGNOSIS — Z0000 Encounter for general adult medical examination without abnormal findings: Secondary | ICD-10-CM | POA: Diagnosis not present

## 2024-05-06 DIAGNOSIS — D539 Nutritional anemia, unspecified: Secondary | ICD-10-CM

## 2024-05-06 DIAGNOSIS — E118 Type 2 diabetes mellitus with unspecified complications: Secondary | ICD-10-CM

## 2024-05-06 DIAGNOSIS — N1831 Chronic kidney disease, stage 3a: Secondary | ICD-10-CM

## 2024-05-06 DIAGNOSIS — R001 Bradycardia, unspecified: Secondary | ICD-10-CM | POA: Diagnosis not present

## 2024-05-06 DIAGNOSIS — Z0001 Encounter for general adult medical examination with abnormal findings: Secondary | ICD-10-CM

## 2024-05-06 LAB — CBC WITH DIFFERENTIAL/PLATELET
Basophils Absolute: 0 10*3/uL (ref 0.0–0.1)
Basophils Relative: 0.3 % (ref 0.0–3.0)
Eosinophils Absolute: 0.1 10*3/uL (ref 0.0–0.7)
Eosinophils Relative: 1.6 % (ref 0.0–5.0)
HCT: 35.5 % — ABNORMAL LOW (ref 36.0–46.0)
Hemoglobin: 11.7 g/dL — ABNORMAL LOW (ref 12.0–15.0)
Lymphocytes Relative: 23.8 % (ref 12.0–46.0)
Lymphs Abs: 1.4 10*3/uL (ref 0.7–4.0)
MCHC: 32.8 g/dL (ref 30.0–36.0)
MCV: 95.5 fl (ref 78.0–100.0)
Monocytes Absolute: 0.5 10*3/uL (ref 0.1–1.0)
Monocytes Relative: 9.1 % (ref 3.0–12.0)
Neutro Abs: 3.8 10*3/uL (ref 1.4–7.7)
Neutrophils Relative %: 65.2 % (ref 43.0–77.0)
Platelets: 292 10*3/uL (ref 150.0–400.0)
RBC: 3.72 Mil/uL — ABNORMAL LOW (ref 3.87–5.11)
RDW: 13 % (ref 11.5–15.5)
WBC: 5.9 10*3/uL (ref 4.0–10.5)

## 2024-05-06 LAB — BASIC METABOLIC PANEL WITH GFR
BUN: 17 mg/dL (ref 6–23)
CO2: 31 meq/L (ref 19–32)
Calcium: 9.3 mg/dL (ref 8.4–10.5)
Chloride: 107 meq/L (ref 96–112)
Creatinine, Ser: 1.12 mg/dL (ref 0.40–1.20)
GFR: 45.45 mL/min — ABNORMAL LOW (ref >60.00)
Glucose, Bld: 138 mg/dL — ABNORMAL HIGH (ref 70–99)
Potassium: 4.8 meq/L (ref 3.5–5.1)
Sodium: 143 meq/L (ref 135–145)

## 2024-05-06 LAB — HEPATIC FUNCTION PANEL
ALT: 5 U/L (ref 0–35)
AST: 14 U/L (ref 0–37)
Albumin: 3.3 g/dL — ABNORMAL LOW (ref 3.5–5.2)
Alkaline Phosphatase: 62 U/L (ref 39–117)
Bilirubin, Direct: 0 mg/dL (ref 0.0–0.3)
Total Bilirubin: 0.2 mg/dL (ref 0.2–1.2)
Total Protein: 6.6 g/dL (ref 6.0–8.3)

## 2024-05-06 LAB — HEMOGLOBIN A1C: Hgb A1c MFr Bld: 6.4 % (ref 4.6–6.5)

## 2024-05-06 LAB — LIPID PANEL
Cholesterol: 90 mg/dL (ref 0–200)
HDL: 33.2 mg/dL — ABNORMAL LOW (ref >39.00)
LDL Cholesterol: 39 mg/dL (ref 0–99)
NonHDL: 56.67
Total CHOL/HDL Ratio: 3
Triglycerides: 87 mg/dL (ref 0.0–149.0)
VLDL: 17.4 mg/dL (ref 0.0–40.0)

## 2024-05-06 LAB — TSH: TSH: 1.45 u[IU]/mL (ref 0.35–5.50)

## 2024-05-06 NOTE — Progress Notes (Unsigned)
 Subjective:  Patient ID: Martha Mata, female    DOB: 1941/06/14  Age: 83 y.o. MRN: [id number]  CC: Annual Exam   HPI Martha Mata presents for a CPX and f/up ----  Discussed the use of AI scribe software for clinical note transcription with the patient, who gave verbal consent to proceed.  History of Present Illness   Martha Mata is an 83 year old female who presents with weight loss and low blood pressure.  She has experienced a weight loss from 163 pounds in January to 152 pounds currently. No nausea, vomiting, abdominal pain, or constipation. She eats well, with her caregiver assisting her at times. She has one meal at a center during the day and two meals at home, with breakfast being prepared by her caregiver.  Her blood pressure is low, and her heart rate is noted to be a little low, but no specific symptoms related to this are reported. No dizziness or lightheadedness.  She attends a center during the day and is described as talking to herself and trying to get out when there is nobody around.       Outpatient Medications Prior to Visit  Medication Sig Dispense Refill   Ascorbic Acid  (VITAMIN C) 100 MG tablet Take 500 mg by mouth daily. With zinc      Cholecalciferol (VITAMIN D3) 50 MCG (2000 UT) CAPS Take 2,000 Units by mouth daily. Take once a day     cyanocobalamin 500 MCG tablet Take 500 mcg by mouth daily.     donepezil  (ARICEPT ) 10 MG tablet Take 1 tablet (10 mg total) by mouth daily. 90 tablet 3   Finerenone  (KERENDIA ) 20 MG TABS Take 1 tablet by mouth daily. 90 tablet 3   memantine  (NAMENDA ) 10 MG tablet Take 1 tablet (10 mg total) by mouth 2 (two) times daily. 180 tablet 3   rosuvastatin  (CRESTOR ) 20 MG tablet TAKE 1 TABLET EACH NIGHT AT BEDTIME. 90 tablet 0   Zinc  100 MG TABS Take by mouth.     metoprolol  tartrate (LOPRESSOR ) 25 MG tablet Take 0.5 tablets (12.5 mg total) by mouth 2 (two) times daily. 90 tablet 1   No facility-administered medications  prior to visit.    ROS Review of Systems  Objective:  BP 116/60 (BP Location: Left Arm, Patient Position: Sitting, Cuff Size: Normal)   Pulse (!) 54   Temp 98.1 F (36.7 C) (Oral)   Resp 16   Ht 5' 6 (1.676 m)   Wt 152 lb (68.9 kg)   SpO2 94% Comment: unable to get  BMI 24.53 kg/m   BP Readings from Last 3 Encounters:  05/06/24 116/60  12/03/23 112/68  12/04/22 (!) 123/57    Wt Readings from Last 3 Encounters:  05/06/24 152 lb (68.9 kg)  12/03/23 162 lb 8 oz (73.7 kg)  07/18/23 179 lb (81.2 kg)    Physical Exam Vitals reviewed.  HENT:     Nose: Nose normal.     Mouth/Throat:     Mouth: Mucous membranes are moist.   Eyes:     General: No scleral icterus.    Conjunctiva/sclera: Conjunctivae normal.    Cardiovascular:     Rate and Rhythm: Regular rhythm. Bradycardia present.     Heart sounds: No murmur heard.    No friction rub. No gallop.     Comments: EKG--- SB, 50 bpm Minimal LVH No Q waves Pulmonary:     Effort: Pulmonary effort is normal.  Breath sounds: No stridor. No wheezing, rhonchi or rales.  Abdominal:     General: Abdomen is flat.     Palpations: There is no mass.     Tenderness: There is no abdominal tenderness. There is no guarding.     Hernia: No hernia is present.   Musculoskeletal:     Cervical back: Neck supple.  Lymphadenopathy:     Cervical: No cervical adenopathy.   Skin:    General: Skin is warm.   Neurological:     Mental Status: She is alert. Mental status is at baseline.     Lab Results  Component Value Date   WBC 5.9 05/06/2024   HGB 11.7 (L) 05/06/2024   HCT 35.5 (L) 05/06/2024   PLT 292.0 05/06/2024   GLUCOSE 138 (H) 05/06/2024   CHOL 90 05/06/2024   TRIG 87.0 05/06/2024   HDL 33.20 (L) 05/06/2024   LDLCALC 39 05/06/2024   ALT 5 05/06/2024   AST 14 05/06/2024   NA 143 05/06/2024   K 4.8 05/06/2024   CL 107 05/06/2024   CREATININE 1.12 05/06/2024   BUN 17 05/06/2024   CO2 31 05/06/2024   TSH 1.45  05/06/2024   INR 1.0 01/12/2022   HGBA1C 6.4 05/06/2024   MICROALBUR 1.2 04/06/2022    DG Tibia/Fibula Right Result Date: 01/12/2022 CLINICAL DATA:  Fall with abrasion anterior right lower leg. EXAM: RIGHT TIBIA AND FIBULA - 2 VIEW COMPARISON:  None. FINDINGS: Right total knee arthroplasty intact. Degenerative changes of the patellofemoral joint. No significant joint effusion. 1 cm loose body along the superior aspect of the patellofemoral joint. No evidence of acute fracture or dislocation. Ankle mortise is normal. Inferior calcaneal spur. Mild degenerate change over the midfoot. IMPRESSION: 1. No acute findings. 2. Right total knee arthroplasty intact. Osteoarthritic change of the patellofemoral joint with 1 cm loose body along the superior aspect of the patellofemoral joint. Electronically Signed   By: Roda Cirri M.D.   On: 01/12/2022 10:29   CT Head Wo Contrast Result Date: 01/12/2022 CLINICAL DATA:  83 year old female with history of head trauma. EXAM: CT HEAD WITHOUT CONTRAST CT CERVICAL SPINE WITHOUT CONTRAST TECHNIQUE: Multidetector CT imaging of the head and cervical spine was performed following the standard protocol without intravenous contrast. Multiplanar CT image reconstructions of the cervical spine were also generated. RADIATION DOSE REDUCTION: This exam was performed according to the departmental dose-optimization program which includes automated exposure control, adjustment of the mA and/or kV according to patient size and/or use of iterative reconstruction technique. COMPARISON:  Head CT 10/28/2021.  No prior cervical spine CT. FINDINGS: CT HEAD FINDINGS Brain: Mild cerebral atrophy. Patchy and confluent areas of decreased attenuation are noted throughout the deep and periventricular white matter of the cerebral hemispheres bilaterally, compatible with chronic microvascular ischemic disease. No evidence of acute infarction, hemorrhage, hydrocephalus, extra-axial collection or mass  lesion/mass effect. Vascular: No hyperdense vessel or unexpected calcification. Skull: Normal. Negative for fracture or focal lesion. Sinuses/Orbits: No acute finding. Other: None. CT CERVICAL SPINE FINDINGS Alignment: Mild reversal of normal cervical lordosis, presumably positional. Alignment is otherwise anatomic. Skull base and vertebrae: No acute fracture. No primary bone lesion or focal pathologic process. Soft tissues and spinal canal: No prevertebral fluid or swelling. No visible canal hematoma. Disc levels: Multilevel degenerative disc disease most evident at C4-C5, C5-C6 and C6-C7. Mild multilevel facet arthropathy. Upper chest: Unremarkable. Other: None. IMPRESSION: 1. No evidence of significant acute traumatic injury to the skull, brain or cervical spine. 2.  Mild cerebral atrophy with chronic microvascular ischemic changes in the cerebral white matter, as above. 3. Multilevel degenerative disc disease and cervical spondylosis, as above. Electronically Signed   By: Alexandria Angel M.D.   On: 01/12/2022 09:58   CT Cervical Spine Wo Contrast Result Date: 01/12/2022 CLINICAL DATA:  83 year old female with history of head trauma. EXAM: CT HEAD WITHOUT CONTRAST CT CERVICAL SPINE WITHOUT CONTRAST TECHNIQUE: Multidetector CT imaging of the head and cervical spine was performed following the standard protocol without intravenous contrast. Multiplanar CT image reconstructions of the cervical spine were also generated. RADIATION DOSE REDUCTION: This exam was performed according to the departmental dose-optimization program which includes automated exposure control, adjustment of the mA and/or kV according to patient size and/or use of iterative reconstruction technique. COMPARISON:  Head CT 10/28/2021.  No prior cervical spine CT. FINDINGS: CT HEAD FINDINGS Brain: Mild cerebral atrophy. Patchy and confluent areas of decreased attenuation are noted throughout the deep and periventricular white matter of the  cerebral hemispheres bilaterally, compatible with chronic microvascular ischemic disease. No evidence of acute infarction, hemorrhage, hydrocephalus, extra-axial collection or mass lesion/mass effect. Vascular: No hyperdense vessel or unexpected calcification. Skull: Normal. Negative for fracture or focal lesion. Sinuses/Orbits: No acute finding. Other: None. CT CERVICAL SPINE FINDINGS Alignment: Mild reversal of normal cervical lordosis, presumably positional. Alignment is otherwise anatomic. Skull base and vertebrae: No acute fracture. No primary bone lesion or focal pathologic process. Soft tissues and spinal canal: No prevertebral fluid or swelling. No visible canal hematoma. Disc levels: Multilevel degenerative disc disease most evident at C4-C5, C5-C6 and C6-C7. Mild multilevel facet arthropathy. Upper chest: Unremarkable. Other: None. IMPRESSION: 1. No evidence of significant acute traumatic injury to the skull, brain or cervical spine. 2. Mild cerebral atrophy with chronic microvascular ischemic changes in the cerebral white matter, as above. 3. Multilevel degenerative disc disease and cervical spondylosis, as above. Electronically Signed   By: Alexandria Angel M.D.   On: 01/12/2022 09:58   DG Pelvis Portable Result Date: 01/12/2022 CLINICAL DATA:  83 year old female status post fall. EXAM: PORTABLE PELVIS 1-2 VIEWS COMPARISON:  None. FINDINGS: The right iliac crest is excluded from the study. There is no evidence of pelvic fracture or diastasis. No pelvic bone lesions are seen. IMPRESSION: No evidence of acute fracture or malalignment. Electronically Signed   By: Creasie Doctor M.D.   On: 01/12/2022 09:50   DG Chest Port 1 View Result Date: 01/12/2022 CLINICAL DATA:  83 year old female with history of fall. EXAM: PORTABLE CHEST 1 VIEW COMPARISON:  Chest x-ray 10/28/2021. FINDINGS: Patient is severely rotated to the right limiting the diagnostic sensitivity and specificity of today's examination. With  these limitations in mind, lung volumes are low. No consolidative airspace disease. No pleural effusions. No pneumothorax. No evidence of pulmonary edema. Heart size is enlarged. Large hiatal hernia. The patient is rotated to the right on today's exam, resulting in distortion of the mediastinal contours and reduced diagnostic sensitivity and specificity for mediastinal pathology. IMPRESSION: 1. Low lung volumes without radiographic evidence of acute cardiopulmonary disease. 2. Cardiomegaly. 3. Large hiatal hernia. Electronically Signed   By: Alexandria Angel M.D.   On: 01/12/2022 09:49    Assessment & Plan:  Bradycardia -     EKG 12-Lead -     TSH; Future  Hypertension, unspecified type -     Basic metabolic panel with GFR; Future -     CBC with Differential/Platelet; Future -     Urinalysis, Routine w reflex microscopic;  Future -     Hepatic function panel; Future  Hyperlipidemia LDL goal <130 -     Lipid panel; Future -     Hepatic function panel; Future  Type II diabetes mellitus with manifestations (HCC) -     Urinalysis, Routine w reflex microscopic; Future -     Hemoglobin A1c; Future -     Microalbumin / creatinine urine ratio; Future  Stage 3a chronic kidney disease (HCC) -     Urinalysis, Routine w reflex microscopic; Future -     Microalbumin / creatinine urine ratio; Future  Encounter for general adult medical examination with abnormal findings  Deficiency anemia -     IBC + Ferritin; Future -     Reticulocytes; Future -     Zinc ; Future -     Vitamin B1; Future -     Folate; Future -     Vitamin B12; Future     Follow-up: Return in about 6 months (around 11/05/2024).  Sandra Crouch, MD

## 2024-05-06 NOTE — Patient Instructions (Signed)

## 2024-05-07 DIAGNOSIS — D539 Nutritional anemia, unspecified: Secondary | ICD-10-CM | POA: Insufficient documentation

## 2024-05-08 LAB — URINALYSIS, ROUTINE W REFLEX MICROSCOPIC
Bilirubin Urine: NEGATIVE
Hgb urine dipstick: NEGATIVE
Ketones, ur: NEGATIVE
Leukocytes,Ua: NEGATIVE
Nitrite: NEGATIVE
Specific Gravity, Urine: 1.015 (ref 1.000–1.030)
Total Protein, Urine: NEGATIVE
Urine Glucose: NEGATIVE
Urobilinogen, UA: 0.2 (ref 0.0–1.0)
pH: 6 (ref 5.0–8.0)

## 2024-05-08 LAB — MICROALBUMIN / CREATININE URINE RATIO
Creatinine,U: 120.3 mg/dL
Microalb Creat Ratio: UNDETERMINED mg/g (ref 0.0–30.0)
Microalb, Ur: <0.7 mg/dL

## 2024-05-27 ENCOUNTER — Encounter: Payer: Self-pay | Admitting: Internal Medicine

## 2024-06-19 ENCOUNTER — Encounter: Payer: Self-pay | Admitting: Internal Medicine

## 2024-07-22 ENCOUNTER — Ambulatory Visit: Payer: Medicare Other

## 2024-07-22 VITALS — Ht 66.0 in | Wt 152.0 lb

## 2024-07-22 DIAGNOSIS — Z0000 Encounter for general adult medical examination without abnormal findings: Secondary | ICD-10-CM

## 2024-07-22 NOTE — Progress Notes (Signed)
 Subjective:  Please attest and cosign this visit due to patients primary care provider not being in the office at the time the visit was completed.  (Pt of Dr Debby Molt)   Martha Mata is a 83 y.o. who presents for a Medicare Wellness preventive visit.  As a reminder, Annual Wellness Visits don't include a physical exam, and some assessments may be limited, especially if this visit is performed virtually. We may recommend an in-person follow-up visit with your provider if needed.  Visit Complete: Virtual I connected with  Martha Mata on 07/22/24 by a audio enabled telemedicine application and verified that I am speaking with the correct person using two identifiers.  Patient Location: Home  Provider Location: Office/Clinic  I discussed the limitations of evaluation and management by telemedicine. The patient expressed understanding and agreed to proceed.  Vital Signs: Because this visit was a virtual/telehealth visit, some criteria may be missing or patient reported. Any vitals not documented were not able to be obtained and vitals that have been documented are patient reported.  VideoDeclined- This patient declined Librarian, academic. Therefore the visit was completed with audio only.  Persons Participating in Visit: Patient assisted by Martha Mata.  AWV Questionnaire: No: Patient Medicare AWV questionnaire was not completed prior to this visit.  Cardiac Risk Factors include: advanced age (>72men, >83 women);diabetes mellitus;dyslipidemia;hypertension     Objective:    Today's Vitals   07/22/24 1552  Weight: 152 lb (68.9 kg)  Height: 5' 6 (1.676 m)   Body mass index is 24.53 kg/m.     07/22/2024    3:52 PM 07/18/2023    4:31 PM 07/26/2022   10:13 AM 10/28/2021   12:35 PM 07/21/2021    6:02 PM 08/22/2017    8:38 AM 08/14/2013   11:39 AM  Advanced Directives  Does Patient Have a Medical Advance Directive? Yes Yes Yes No Yes No   Patient does not have advance directive   Type of Advance Directive Healthcare Power of [place];Living will Healthcare Power of [place];Living will Healthcare Power of [place];Living will  Healthcare Power of Attorney    Does patient want to make changes to medical advance directive? No - Patient declined    Yes (ED - Information included in AVS)    Copy of Healthcare Power of Attorney in Chart? Yes - validated most recent copy scanned in chart (See row information) No - copy requested No - copy requested  Yes - validated most recent copy scanned in chart (See row information)    Would patient like information on creating a medical advance directive?      No - Patient declined    Pre-existing out of facility DNR order (yellow form or pink MOST form)       No      Data saved with a previous flowsheet row definition    Current Medications (verified) Outpatient Encounter Medications as of 07/22/2024  Medication Sig   Ascorbic Acid  (VITAMIN C) 100 MG tablet Take 500 mg by mouth daily. With zinc    Cholecalciferol (VITAMIN D3) 50 MCG (2000 UT) CAPS Take 2,000 Units by mouth daily. Take once a day   cyanocobalamin 500 MCG tablet Take 500 mcg by mouth daily.   donepezil  (ARICEPT ) 10 MG tablet Take 1 tablet (10 mg total) by mouth daily.   Finerenone  (KERENDIA ) 20 MG TABS Take 1 tablet by mouth daily.   memantine  (NAMENDA ) 10 MG tablet Take 1 tablet (10 mg total)  by mouth 2 (two) times daily.   rosuvastatin  (CRESTOR ) 20 MG tablet TAKE 1 TABLET EACH NIGHT AT BEDTIME.   Zinc  100 MG TABS Take by mouth.   No facility-administered encounter medications on file as of 07/22/2024.    Allergies (verified) Farxiga  [dapagliflozin ]   History: Past Medical History:  Diagnosis Date   Diabetes mellitus without complication (HCC)    Fibroid, uterine    GERD (gastroesophageal reflux disease)    no meds   Hypertension    Memory loss of unknown cause    per pt's admission   Vitamin D  deficiency disease     Past Surgical History:  Procedure Laterality Date   CERVIX LESION DESTRUCTION     COLONOSCOPY  2005   DILATATION & CURRETTAGE/HYSTEROSCOPY WITH RESECTOCOPE N/A 08/14/2013   Procedure: DILATATION & CURETTAGE/HYSTEROSCOPY WITH RESECTOCOPE;  Surgeon: Dickie DELENA Carder, MD;  Location: [facility];  Service: Gynecology;  Laterality: N/A;   DILATION AND CURETTAGE OF UTERUS     HYSTEROSCOPY     KNEE ARTHROCENTESIS  2010   TUBAL LIGATION     UTERINE FIBROID SURGERY  2005   Family History  Problem Relation Age of Onset   Diabetes Son    Social History   Socioeconomic History   Marital status: Widowed    Spouse name: Not on file   Number of children: 2   Years of education: 12+   Highest education level: 12th grade  Occupational History   Occupation: Retired  Tobacco Use   Smoking status: Never   Smokeless tobacco: Never  Vaping Use   Vaping status: Never Used  Substance and Sexual Activity   Alcohol use: Never    Comment: occasionally,one glass of wine monthly   Drug use: No   Sexual activity: Not Currently  Other Topics Concern   Not on file  Social History Narrative   Patient is retired.    Patient is a non-smoker    Patient lives with son due to dementia. Has a caretaker who comes in to help as needed.   Patient consumes tea and coffee (1-2 cups daily)   Patient is right handed.   Patient has a college education.   Patient has two children.   Social Drivers of Corporate investment banker Strain: Low Risk  (07/22/2024)   Overall Financial Resource Strain (CARDIA)    Difficulty of Paying Living Expenses: Not hard at all  Food Insecurity: No Food Insecurity (07/22/2024)   Hunger Vital Sign    Worried About Running Out of Food in the Last Year: Never true    Ran Out of Food in the Last Year: Never true  Transportation Needs: No Transportation Needs (07/22/2024)   PRAPARE - Administrator, Civil Service (Medical): No    Lack of Transportation (Non-Medical): No   Physical Activity: Inactive (07/22/2024)   Exercise Vital Sign    Days of Exercise per Week: 0 days    Minutes of Exercise per Session: 0 min  Stress: No Stress Concern Present (07/22/2024)   [employer] of Occupational Health - Occupational Stress Questionnaire    Feeling of Stress: Not at all  Social Connections: Socially Isolated (07/22/2024)   Social Connection and Isolation Panel    Frequency of Communication with Friends and Family: Once a week    Frequency of Social Gatherings with Friends and Family: Never    Attends Religious Services: 1 to 4 times per year    Active Member of Clubs or Organizations: No  Attends Banker Meetings: Never    Marital Status: Divorced    Tobacco Counseling Counseling given: Not Answered    Clinical Intake:  Pre-visit preparation completed: Yes  Pain : No/denies pain     BMI - recorded: 24.53 Nutritional Status: BMI of 19-24  Normal Nutritional Risks: None Diabetes: Yes CBG done?: No Did pt. bring in CBG monitor from home?: No  Lab Results  Component Value Date   HGBA1C 6.4 05/06/2024   HGBA1C 6.8 (H) 11/21/2022   HGBA1C 7.7 (H) 04/06/2022     How often do you need to have someone help you when you read instructions, pamphlets, or other written materials from your doctor or pharmacy?: 1 - Never  Interpreter Needed?: No  Information entered by :: Verdie Saba, CMA   Activities of Daily Living     07/22/2024    3:57 PM  In your present state of health, do you have any difficulty performing the following activities:  Hearing? 0  Vision? 0  Difficulty concentrating or making decisions? 1  Comment Caregiver/Son assists  Walking or climbing stairs? 1  Comment Caregiver/Son assists  Dressing or bathing? 1  Comment Caregiver/Son assists  Doing errands, shopping? 1  Comment Caregiver/Son assists  Quarry manager and eating ? Y  Comment Caregiver/Son assists  Using the Toilet? Y  Comment Caregiver/Son  assists  In the past six months, have you accidently leaked urine? Y  Comment wears depends  Do you have problems with loss of bowel control? Y  Comment wears depends  Managing your Medications? Y  Comment Caregiver/Son assists  Managing your Finances? Y  Comment Caregiver/Son assists  Housekeeping or managing your Housekeeping? Y  Comment Caregiver/Son assists    Patient Care Team: Joshua Debby CROME, MD as PCP - General (Internal Medicine) Cary No, NP as Nurse Practitioner (Neurology) Magdalen Pasco RAMAN, DPM as Consulting Physician (Podiatry) [facility] as Consulting Physician (Optometry)  I have updated your Care Teams any recent Medical Services you may have received from other providers in the past year.     Assessment:   This is a routine wellness examination for Eloni.  Hearing/Vision screen Hearing Screening - Comments:: Denies hearing difficulties   Vision Screening - Comments:: Wears rx glasses - up to date with routine eye exams with [facility]   Goals Addressed               This Visit's Progress     Patient Stated (pt-stated)        Patient's son stated he plans to continue doing same activities and manage her diet       Depression Screen     07/22/2024    4:02 PM 07/18/2023    4:25 PM 07/26/2022   10:12 AM 07/21/2021    6:02 PM 02/07/2021   10:01 AM 01/28/2020   10:42 AM 04/25/2018    1:34 PM  PHQ 2/9 Scores  PHQ - 2 Score 0 0 0 0 0 0 0  PHQ- 9 Score 3 3         Fall Risk     07/22/2024    4:02 PM 05/06/2024   10:19 AM 07/18/2023    4:31 PM 07/26/2022   10:14 AM 07/21/2021    5:59 PM  Fall Risk   Falls in the past year? 0 0 0 0 0  Number falls in past yr: 0 0 0 0 0  Injury with Fall? 0 0 0 0 0  Risk for fall due to : No Fall Risks No Fall Risks No Fall Risks No Fall Risks   Follow up Falls evaluation completed;Falls prevention discussed Falls evaluation completed Falls prevention discussed Falls prevention discussed       Data saved with a  previous flowsheet row definition    MEDICARE RISK AT HOME:  Medicare Risk at Home Any stairs in or around the home?: Yes If so, are there any without handrails?: No Home free of loose throw rugs in walkways, pet beds, electrical cords, etc?: Yes Adequate lighting in your home to reduce risk of falls?: Yes Life alert?: No Use of a cane, walker or w/c?: No Grab bars in the bathroom?: Yes Shower chair or bench in shower?: No Elevated toilet seat or a handicapped toilet?: Yes  TIMED UP AND GO:  Was the test performed?  No  Cognitive Function: Impaired: Patient has current diagnosis of cognitive impairment.    07/22/2024    4:04 PM 07/18/2023    4:33 PM 07/26/2022   10:16 AM 11/30/2021   11:00 AM 11/30/2020   11:00 AM  MMSE - Mini Mental State Exam  Not completed: Unable to complete Unable to complete Unable to complete Unable to complete Unable to complete        07/26/2022   10:16 AM  6CIT Screen  What Year? 4 points  What month? 3 points  What time? 3 points  Count back from 20 4 points  Months in reverse 4 points  Repeat phrase 10 points  Total Score 28 points    Immunizations Immunization History  Administered Date(s) Administered   Fluad Quad(high Dose 65+) 08/26/2019, 07/26/2022   INFLUENZA, HIGH DOSE SEASONAL PF 08/06/2018, 09/21/2023   Influenza-Unspecified 08/19/2020   PFIZER(Purple Top)SARS-COV-2 Vaccination 12/01/2019, 12/31/2019   Pfizer(Comirnaty)Fall Seasonal Vaccine 12 years and older 09/21/2023   Pneumococcal Conjugate-13 04/25/2018   Pneumococcal Polysaccharide-23 04/27/2019   Tdap 04/25/2018, 01/12/2022   Zoster Recombinant(Shingrix) 01/31/2017, 04/04/2017    Screening Tests Health Maintenance  Topic Date Due   FOOT EXAM  11/22/2023   INFLUENZA VACCINE  06/19/2024   COVID-19 Vaccine (4 - 2024-25 season) 07/20/2024   OPHTHALMOLOGY EXAM  07/22/2024 (Originally 10/01/1951)   DEXA SCAN  05/06/2025 (Originally 10/01/2005)   HEMOGLOBIN A1C  11/05/2024    Diabetic kidney evaluation - eGFR measurement  05/06/2025   Diabetic kidney evaluation - Urine ACR  05/06/2025   Medicare Annual Wellness (AWV)  07/22/2025   DTaP/Tdap/Td (3 - Td or Tdap) 01/13/2032   Pneumococcal Vaccine: 50+ Years  Completed   Zoster Vaccines- Shingrix  Completed   HPV VACCINES  Aged Out   Meningococcal B Vaccine  Aged Out    Health Maintenance  Health Maintenance Due  Topic Date Due   FOOT EXAM  11/22/2023   INFLUENZA VACCINE  06/19/2024   COVID-19 Vaccine (4 - 2024-25 season) 07/20/2024   Health Maintenance Items Addressed: 07/22/2024  Additional Screening:  Vision Screening: Recommended annual ophthalmology exams for early detection of glaucoma and other disorders of the eye. Would you like a referral to an eye doctor? No    Dental Screening: Recommended annual dental exams for proper oral hygiene  Community Resource Referral / Chronic Care Management: CRR required this visit?  No   CCM required this visit?  No   Plan:    I have personally reviewed and noted the following in the patient's chart:   Medical and social history Use of alcohol, tobacco or illicit drugs  Current medications and  supplements including opioid prescriptions. Patient is not currently taking opioid prescriptions. Functional ability and status Nutritional status Physical activity Advanced directives List of other physicians Hospitalizations, surgeries, and ER visits in previous 12 months Vitals Screenings to include cognitive, depression, and falls Referrals and appointments  In addition, I have reviewed and discussed with patient certain preventive protocols, quality metrics, and best practice recommendations. A written personalized care plan for preventive services as well as general preventive health recommendations were provided to patient.   Verdie CHRISTELLA Saba, CMA   07/22/2024   After Visit Summary: (MyChart) Due to this being a telephonic visit, the after visit  summary with patients personalized plan was offered to patient via MyChart   Notes: Scheduled a 6-mth Diabetes f/u w/PCP for 10/2024.

## 2024-07-22 NOTE — Patient Instructions (Addendum)
 Ms. Savoia , Thank you for taking time out of your busy schedule to complete your Annual Wellness Visit with me. I enjoyed our conversation and look forward to speaking with you again next year. I, as well as your care team,  appreciate your ongoing commitment to your health goals. Please review the following plan we discussed and let me know if I can assist you in the future. Your Game plan/ To Do List    Referrals: If you haven't heard from the office you've been referred to, please reach out to them at the phone provided.   Follow up Visits: We will see or speak with you next year for your Next Medicare AWV with our clinical staff Have you seen your provider in the last 6 months (3 months if uncontrolled diabetes)? Yes  Clinician Recommendations:  Aim for 30 minutes of exercise or brisk walking, 6-8 glasses of water, and 5 servings of fruits and vegetables each day.       This is a list of the screenings recommended for you:  Health Maintenance  Topic Date Due   Eye exam for diabetics  Never done   Complete foot exam   11/22/2023   Flu Shot  06/19/2024   COVID-19 Vaccine (4 - 2024-25 season) 07/20/2024   DEXA scan (bone density measurement)  05/06/2025*   Hemoglobin A1C  11/05/2024   Yearly kidney function blood test for diabetes  05/06/2025   Yearly kidney health urinalysis for diabetes  05/06/2025   Medicare Annual Wellness Visit  07/22/2025   DTaP/Tdap/Td vaccine (3 - Td or Tdap) 01/13/2032   Pneumococcal Vaccine for age over 37  Completed   Zoster (Shingles) Vaccine  Completed   HPV Vaccine  Aged Out   Meningitis B Vaccine  Aged Out  *Topic was postponed. The date shown is not the original due date.    Advanced directives: (In Chart) A copy of your advanced directives are scanned into your chart should your provider ever need it. Advance Care Planning is important because it:  [x]  Makes sure you receive the medical care that is consistent with your values, goals, and  preferences  [x]  It provides guidance to your family and loved ones and reduces their decisional burden about whether or not they are making the right decisions based on your wishes.  Follow the link provided in your after visit summary or read over the paperwork we have mailed to you to help you started getting your Advance Directives in place. If you need assistance in completing these, please reach out to us  so that we can help you!

## 2024-07-26 ENCOUNTER — Other Ambulatory Visit: Payer: Self-pay | Admitting: Internal Medicine

## 2024-07-26 DIAGNOSIS — E785 Hyperlipidemia, unspecified: Secondary | ICD-10-CM

## 2024-08-05 ENCOUNTER — Encounter: Payer: Self-pay | Admitting: Internal Medicine

## 2024-08-07 ENCOUNTER — Telehealth: Payer: Self-pay

## 2024-08-07 NOTE — Telephone Encounter (Signed)
 Copied from CRM [id number]. Topic: General - Other >> Aug 07, 2024 11:02 AM Macario HERO wrote: Reason for CRM: Martha Mata from [facility] [phone] - called regarding a medical form that needs to be completed and sent back. Note that General information area needs to be filled out and in the please comment section (need to place note to monitor for dementia).

## 2024-08-07 NOTE — Telephone Encounter (Signed)
 Patients son called in the office today and I spoke with him on the phone about this. I advised him that these forms were sent back on 07/27/2024 I still have the confirmation fax. Patients son gave a verbal understanding.

## 2024-08-07 NOTE — Telephone Encounter (Signed)
 Spoke with Luke. I will update the forms as requested and fax them back to her.

## 2024-08-31 ENCOUNTER — Encounter: Payer: Self-pay | Admitting: Podiatry

## 2024-08-31 ENCOUNTER — Ambulatory Visit: Admitting: Podiatry

## 2024-08-31 DIAGNOSIS — M79674 Pain in right toe(s): Secondary | ICD-10-CM | POA: Diagnosis not present

## 2024-08-31 DIAGNOSIS — E1149 Type 2 diabetes mellitus with other diabetic neurological complication: Secondary | ICD-10-CM

## 2024-08-31 DIAGNOSIS — M79675 Pain in left toe(s): Secondary | ICD-10-CM

## 2024-08-31 DIAGNOSIS — B351 Tinea unguium: Secondary | ICD-10-CM | POA: Diagnosis not present

## 2024-08-31 DIAGNOSIS — E1140 Type 2 diabetes mellitus with diabetic neuropathy, unspecified: Secondary | ICD-10-CM | POA: Diagnosis not present

## 2024-08-31 DIAGNOSIS — N1831 Chronic kidney disease, stage 3a: Secondary | ICD-10-CM

## 2024-08-31 NOTE — Progress Notes (Addendum)
 This patient returns to my office for at risk foot care.  This patient requires this care by a professional since this patient will be at risk due to having type 2 diabetes and CKD. This patient is unable to cut nails herself since the patient cannot reach her nails.These nails are painful walking and wearing shoes.  She presents to the office with caregiver and in a wheelchair.This patient presents for at risk foot care today.  General Appearance  Alert, conversant and in no acute stress.  Vascular  Dorsalis pedis and posterior tibial  pulses are weakly  palpable  bilaterally.  Capillary return is within normal limits  bilaterally. Temperature is within normal limits  bilaterally.  Neurologic  Senn-Weinstein monofilament wire test within normal limits  bilaterally. Muscle power within normal limits bilaterally.  Nails Thick disfigured discolored nails with subungual debris  from hallux to fifth toes bilaterally. No evidence of bacterial infection or drainage bilaterally.  Orthopedic  No limitations of motion  feet .  No crepitus or effusions noted.  No bony pathology or digital deformities noted.  HAV  B/L.  Skin  normotropic skin with no porokeratosis noted bilaterally.  No signs of infections or ulcers noted.     Onychomycosis  Pain in right toes  Pain in left toes  Consent was obtained for treatment procedures.   Mechanical debridement of nails 1-5  bilaterally performed with a nail nipper.  Filed with dremel without incident.    Return office visit      prn               Told patient to return for periodic foot care and evaluation due to potential at risk complications.   Cordella Bold DPM

## 2024-09-29 ENCOUNTER — Other Ambulatory Visit: Payer: Self-pay | Admitting: Internal Medicine

## 2024-09-29 DIAGNOSIS — N1831 Chronic kidney disease, stage 3a: Secondary | ICD-10-CM

## 2024-09-29 DIAGNOSIS — E118 Type 2 diabetes mellitus with unspecified complications: Secondary | ICD-10-CM

## 2024-09-29 MED ORDER — KERENDIA 20 MG PO TABS: 1.0000 | ORAL_TABLET | Freq: Every day | ORAL | 3 refills | Status: AC

## 2024-09-29 NOTE — Telephone Encounter (Signed)
 Copied from CRM [id number]. Topic: Clinical - Medication Refill >> Sep 29, 2024 10:18 AM Drema MATSU wrote: Medication: Finerenone  (KERENDIA ) 20 MG TABS  Has the patient contacted their pharmacy? no (Agent: If no, request that the patient contact the pharmacy for the refill. If patient does not wish to contact the pharmacy document the reason why and proceed with request.) no refills  (Agent: If yes, when and what did the pharmacy advise?)  This is the patient's preferred pharmacy:  [facility] [address] [place], [place] [address] [phone] fax [phone]  Is this the correct pharmacy for this prescription? Yes If no, delete pharmacy and type the correct one.   Has the prescription been filled recently? Yes  Is the patient out of the medication? NO  Has the patient been seen for an appointment in the last year OR does the patient have an upcoming appointment? Yes  Can we respond through MyChart? N/A  Agent: Please be advised that Rx refills may take up to 3 business days. We ask that you follow-up with your pharmacy.

## 2024-10-21 ENCOUNTER — Encounter: Payer: Self-pay | Admitting: Internal Medicine

## 2024-10-24 ENCOUNTER — Other Ambulatory Visit: Payer: Self-pay | Admitting: Internal Medicine

## 2024-10-24 DIAGNOSIS — E785 Hyperlipidemia, unspecified: Secondary | ICD-10-CM

## 2024-11-10 ENCOUNTER — Ambulatory Visit: Payer: Self-pay | Admitting: Internal Medicine

## 2024-11-10 ENCOUNTER — Encounter: Payer: Self-pay | Admitting: Internal Medicine

## 2024-11-10 ENCOUNTER — Ambulatory Visit (INDEPENDENT_AMBULATORY_CARE_PROVIDER_SITE_OTHER): Admitting: Internal Medicine

## 2024-11-10 VITALS — BP 140/68 | HR 58 | Temp 97.8°F | Resp 16 | Ht 66.0 in | Wt 152.0 lb

## 2024-11-10 DIAGNOSIS — D539 Nutritional anemia, unspecified: Secondary | ICD-10-CM | POA: Diagnosis not present

## 2024-11-10 DIAGNOSIS — I10 Essential (primary) hypertension: Secondary | ICD-10-CM

## 2024-11-10 DIAGNOSIS — E11618 Type 2 diabetes mellitus with other diabetic arthropathy: Secondary | ICD-10-CM | POA: Diagnosis not present

## 2024-11-10 DIAGNOSIS — Z23 Encounter for immunization: Secondary | ICD-10-CM | POA: Diagnosis not present

## 2024-11-10 DIAGNOSIS — N1832 Chronic kidney disease, stage 3b: Secondary | ICD-10-CM | POA: Diagnosis not present

## 2024-11-10 DIAGNOSIS — E119 Type 2 diabetes mellitus without complications: Secondary | ICD-10-CM | POA: Insufficient documentation

## 2024-11-10 LAB — BASIC METABOLIC PANEL WITH GFR
BUN: 13 mg/dL (ref 6–23)
CO2: 29 meq/L (ref 19–32)
Calcium: 9.7 mg/dL (ref 8.4–10.5)
Chloride: 107 meq/L (ref 96–112)
Creatinine, Ser: 1.02 mg/dL (ref 0.40–1.20)
GFR: 50.66 mL/min — ABNORMAL LOW
Glucose, Bld: 124 mg/dL — ABNORMAL HIGH (ref 70–99)
Potassium: 4.3 meq/L (ref 3.5–5.1)
Sodium: 145 meq/L (ref 135–145)

## 2024-11-10 LAB — CBC WITH DIFFERENTIAL/PLATELET
Basophils Absolute: 0 K/uL (ref 0.0–0.1)
Basophils Relative: 0.4 % (ref 0.0–3.0)
Eosinophils Absolute: 0.1 K/uL (ref 0.0–0.7)
Eosinophils Relative: 1.4 % (ref 0.0–5.0)
HCT: 37.4 % (ref 36.0–46.0)
Hemoglobin: 12.3 g/dL (ref 12.0–15.0)
Lymphocytes Relative: 21.7 % (ref 12.0–46.0)
Lymphs Abs: 1 K/uL (ref 0.7–4.0)
MCHC: 32.8 g/dL (ref 30.0–36.0)
MCV: 97.2 fl (ref 78.0–100.0)
Monocytes Absolute: 0.2 K/uL (ref 0.1–1.0)
Monocytes Relative: 4.8 % (ref 3.0–12.0)
Neutro Abs: 3.4 K/uL (ref 1.4–7.7)
Neutrophils Relative %: 71.7 % (ref 43.0–77.0)
Platelets: 233 K/uL (ref 150.0–400.0)
RBC: 3.85 Mil/uL — ABNORMAL LOW (ref 3.87–5.11)
RDW: 13.8 % (ref 11.5–15.5)
WBC: 4.8 K/uL (ref 4.0–10.5)

## 2024-11-10 LAB — IBC + FERRITIN
Ferritin: 43.4 ng/mL (ref 10.0–291.0)
Iron: 89 ug/dL (ref 42–145)
Saturation Ratios: 27.4 % (ref 20.0–50.0)
TIBC: 324.8 ug/dL (ref 250.0–450.0)
Transferrin: 232 mg/dL (ref 212.0–360.0)

## 2024-11-10 LAB — VITAMIN B12: Vitamin B-12: 1380 pg/mL — ABNORMAL HIGH (ref 211–911)

## 2024-11-10 LAB — FOLATE: Folate: 16.4 ng/mL

## 2024-11-10 LAB — HEMOGLOBIN A1C: Hgb A1c MFr Bld: 6.1 % (ref 4.6–6.5)

## 2024-11-10 NOTE — Progress Notes (Signed)
 "  Subjective:  Patient ID: Martha Mata, female    DOB: 1941/02/28  Age: 83 y.o. MRN: [id number]  CC: Anemia   HPI Martha Mata presents for f/up ---  Discussed the use of AI scribe software for clinical note transcription with the patient, who gave verbal consent to proceed.  History of Present Illness Martha Mata is an 83 year old female with Alzheimer's disease who presents for routine follow-up and vaccination.  Her caregiver noted two episodes of jerking movements while she was sitting on the commode, and her knees almost gave way while walking to the car, but she did not fall.  There is a history of concern for anemia, but it has not been rechecked since the last visit.  She attends adult daycare four days a week, which provides her with some physical activity.  No complaints of coughing or swelling. No observed sources of blood loss.     Outpatient Medications Prior to Visit  Medication Sig Dispense Refill   Ascorbic Acid  (VITAMIN C) 100 MG tablet Take 500 mg by mouth daily. With zinc      Cholecalciferol (VITAMIN D3) 50 MCG (2000 UT) CAPS Take 2,000 Units by mouth daily. Take once a day     cyanocobalamin 500 MCG tablet Take 500 mcg by mouth daily.     donepezil  (ARICEPT ) 10 MG tablet Take 1 tablet (10 mg total) by mouth daily. 90 tablet 3   Finerenone  (KERENDIA ) 20 MG TABS Take 1 tablet (20 mg total) by mouth daily. 90 tablet 3   memantine  (NAMENDA ) 10 MG tablet Take 1 tablet (10 mg total) by mouth 2 (two) times daily. 180 tablet 3   rosuvastatin  (CRESTOR ) 20 MG tablet TAKE 1 TABLET BY MOUTH EVERYDAY AT BEDTIME 90 tablet 0   Zinc  100 MG TABS Take by mouth.     No facility-administered medications prior to visit.    ROS Review of Systems  Constitutional:  Negative for appetite change, chills, diaphoresis, fatigue and fever.  HENT: Negative.    Eyes: Negative.   Respiratory: Negative.  Negative for cough, chest tightness, shortness of breath and  wheezing.   Cardiovascular:  Negative for chest pain, palpitations and leg swelling.  Gastrointestinal: Negative.  Negative for abdominal pain, constipation, diarrhea, nausea and vomiting.  Genitourinary: Negative.  Negative for difficulty urinating and dysuria.  Musculoskeletal:  Positive for gait problem. Negative for arthralgias and myalgias.  Skin: Negative.   Neurological:  Positive for tremors and weakness. Negative for light-headedness, numbness and headaches.  Hematological:  Negative for adenopathy. Does not bruise/bleed easily.  Psychiatric/Behavioral:  Positive for confusion and decreased concentration. The patient is not nervous/anxious.     Objective:  BP (!) 140/68 (BP Location: Left Arm, Patient Position: Sitting, Cuff Size: Normal)   Pulse (!) 58   Temp 97.8 F (36.6 C) (Oral)   Resp 16   Ht 5' 6 (1.676 m)   Wt 152 lb (68.9 kg) Comment: Unable to weigh  SpO2 96%   BMI 24.53 kg/m   BP Readings from Last 3 Encounters:  11/10/24 (!) 140/68  05/06/24 116/60  12/03/23 112/68    Wt Readings from Last 3 Encounters:  11/10/24 152 lb (68.9 kg)  07/22/24 152 lb (68.9 kg)  05/06/24 152 lb (68.9 kg)    Physical Exam Vitals reviewed.  Constitutional:      Appearance: She is ill-appearing (in a wheelchair).  HENT:     Mouth/Throat:     Mouth: Mucous membranes are  moist.  Eyes:     General: No scleral icterus.    Conjunctiva/sclera: Conjunctivae normal.  Cardiovascular:     Rate and Rhythm: Regular rhythm. Bradycardia present.     Heart sounds: No murmur heard.    No friction rub. No gallop.  Pulmonary:     Effort: Pulmonary effort is normal.     Breath sounds: No stridor. No wheezing, rhonchi or rales.  Abdominal:     General: Abdomen is flat.     Palpations: There is no mass.     Tenderness: There is no abdominal tenderness. There is no guarding.     Hernia: No hernia is present.  Musculoskeletal:        General: Normal range of motion.     Cervical  back: Neck supple.     Right lower leg: No edema.     Left lower leg: No edema.  Lymphadenopathy:     Cervical: No cervical adenopathy.  Skin:    General: Skin is warm and dry.  Neurological:     Mental Status: She is alert.  Psychiatric:        Speech: She is noncommunicative.     Lab Results  Component Value Date   WBC 4.8 11/10/2024   HGB 12.3 11/10/2024   HCT 37.4 11/10/2024   PLT 233.0 11/10/2024   GLUCOSE 124 (H) 11/10/2024   CHOL 90 05/06/2024   TRIG 87.0 05/06/2024   HDL 33.20 (L) 05/06/2024   LDLCALC 39 05/06/2024   ALT 5 05/06/2024   AST 14 05/06/2024   NA 145 11/10/2024   K 4.3 11/10/2024   CL 107 11/10/2024   CREATININE 1.02 11/10/2024   BUN 13 11/10/2024   CO2 29 11/10/2024   TSH 1.45 05/06/2024   INR 1.0 01/12/2022   HGBA1C 6.1 11/10/2024   MICROALBUR <0.7 05/06/2024    DG Tibia/Fibula Right Result Date: 01/12/2022 CLINICAL DATA:  Fall with abrasion anterior right lower leg. EXAM: RIGHT TIBIA AND FIBULA - 2 VIEW COMPARISON:  None. FINDINGS: Right total knee arthroplasty intact. Degenerative changes of the patellofemoral joint. No significant joint effusion. 1 cm loose body along the superior aspect of the patellofemoral joint. No evidence of acute fracture or dislocation. Ankle mortise is normal. Inferior calcaneal spur. Mild degenerate change over the midfoot. IMPRESSION: 1. No acute findings. 2. Right total knee arthroplasty intact. Osteoarthritic change of the patellofemoral joint with 1 cm loose body along the superior aspect of the patellofemoral joint. Electronically Signed   By: Toribio Agreste M.D.   On: 01/12/2022 10:29   CT Head Wo Contrast Result Date: 01/12/2022 CLINICAL DATA:  83 year old female with history of head trauma. EXAM: CT HEAD WITHOUT CONTRAST CT CERVICAL SPINE WITHOUT CONTRAST TECHNIQUE: Multidetector CT imaging of the head and cervical spine was performed following the standard protocol without intravenous contrast. Multiplanar CT image  reconstructions of the cervical spine were also generated. RADIATION DOSE REDUCTION: This exam was performed according to the departmental dose-optimization program which includes automated exposure control, adjustment of the mA and/or kV according to patient size and/or use of iterative reconstruction technique. COMPARISON:  Head CT 10/28/2021.  No prior cervical spine CT. FINDINGS: CT HEAD FINDINGS Brain: Mild cerebral atrophy. Patchy and confluent areas of decreased attenuation are noted throughout the deep and periventricular white matter of the cerebral hemispheres bilaterally, compatible with chronic microvascular ischemic disease. No evidence of acute infarction, hemorrhage, hydrocephalus, extra-axial collection or mass lesion/mass effect. Vascular: No hyperdense vessel or unexpected calcification. Skull: Normal.  Negative for fracture or focal lesion. Sinuses/Orbits: No acute finding. Other: None. CT CERVICAL SPINE FINDINGS Alignment: Mild reversal of normal cervical lordosis, presumably positional. Alignment is otherwise anatomic. Skull base and vertebrae: No acute fracture. No primary bone lesion or focal pathologic process. Soft tissues and spinal canal: No prevertebral fluid or swelling. No visible canal hematoma. Disc levels: Multilevel degenerative disc disease most evident at C4-C5, C5-C6 and C6-C7. Mild multilevel facet arthropathy. Upper chest: Unremarkable. Other: None. IMPRESSION: 1. No evidence of significant acute traumatic injury to the skull, brain or cervical spine. 2. Mild cerebral atrophy with chronic microvascular ischemic changes in the cerebral white matter, as above. 3. Multilevel degenerative disc disease and cervical spondylosis, as above. Electronically Signed   By: Toribio Aye M.D.   On: 01/12/2022 09:58   CT Cervical Spine Wo Contrast Result Date: 01/12/2022 CLINICAL DATA:  83 year old female with history of head trauma. EXAM: CT HEAD WITHOUT CONTRAST CT CERVICAL SPINE  WITHOUT CONTRAST TECHNIQUE: Multidetector CT imaging of the head and cervical spine was performed following the standard protocol without intravenous contrast. Multiplanar CT image reconstructions of the cervical spine were also generated. RADIATION DOSE REDUCTION: This exam was performed according to the departmental dose-optimization program which includes automated exposure control, adjustment of the mA and/or kV according to patient size and/or use of iterative reconstruction technique. COMPARISON:  Head CT 10/28/2021.  No prior cervical spine CT. FINDINGS: CT HEAD FINDINGS Brain: Mild cerebral atrophy. Patchy and confluent areas of decreased attenuation are noted throughout the deep and periventricular white matter of the cerebral hemispheres bilaterally, compatible with chronic microvascular ischemic disease. No evidence of acute infarction, hemorrhage, hydrocephalus, extra-axial collection or mass lesion/mass effect. Vascular: No hyperdense vessel or unexpected calcification. Skull: Normal. Negative for fracture or focal lesion. Sinuses/Orbits: No acute finding. Other: None. CT CERVICAL SPINE FINDINGS Alignment: Mild reversal of normal cervical lordosis, presumably positional. Alignment is otherwise anatomic. Skull base and vertebrae: No acute fracture. No primary bone lesion or focal pathologic process. Soft tissues and spinal canal: No prevertebral fluid or swelling. No visible canal hematoma. Disc levels: Multilevel degenerative disc disease most evident at C4-C5, C5-C6 and C6-C7. Mild multilevel facet arthropathy. Upper chest: Unremarkable. Other: None. IMPRESSION: 1. No evidence of significant acute traumatic injury to the skull, brain or cervical spine. 2. Mild cerebral atrophy with chronic microvascular ischemic changes in the cerebral white matter, as above. 3. Multilevel degenerative disc disease and cervical spondylosis, as above. Electronically Signed   By: Toribio Aye M.D.   On: 01/12/2022  09:58   DG Pelvis Portable Result Date: 01/12/2022 CLINICAL DATA:  83 year old female status post fall. EXAM: PORTABLE PELVIS 1-2 VIEWS COMPARISON:  None. FINDINGS: The right iliac crest is excluded from the study. There is no evidence of pelvic fracture or diastasis. No pelvic bone lesions are seen. IMPRESSION: No evidence of acute fracture or malalignment. Electronically Signed   By: Ester Sides M.D.   On: 01/12/2022 09:50   DG Chest Port 1 View Result Date: 01/12/2022 CLINICAL DATA:  83 year old female with history of fall. EXAM: PORTABLE CHEST 1 VIEW COMPARISON:  Chest x-ray 10/28/2021. FINDINGS: Patient is severely rotated to the right limiting the diagnostic sensitivity and specificity of today's examination. With these limitations in mind, lung volumes are low. No consolidative airspace disease. No pleural effusions. No pneumothorax. No evidence of pulmonary edema. Heart size is enlarged. Large hiatal hernia. The patient is rotated to the right on today's exam, resulting in distortion of the mediastinal contours  and reduced diagnostic sensitivity and specificity for mediastinal pathology. IMPRESSION: 1. Low lung volumes without radiographic evidence of acute cardiopulmonary disease. 2. Cardiomegaly. 3. Large hiatal hernia. Electronically Signed   By: Toribio Aye M.D.   On: 01/12/2022 09:49   Estimated Creatinine Clearance: 38.4 mL/min (by C-G formula based on SCr of 1.02 mg/dL).   Assessment & Plan:   Type 2 diabetes mellitus with other diabetic arthropathy, without long-term current use of insulin  (HCC)- Blood sugar is well controlled. -     Basic metabolic panel with GFR; Future -     Hemoglobin A1c; Future  Deficiency anemia- H/H are normal now. -     IBC + Ferritin; Future -     CBC with Differential/Platelet; Future -     Reticulocytes; Future -     Vitamin B12; Future -     Folate; Future -     Zinc ; Future -     Vitamin B1; Future  Hypertension, unspecified type- BP is  well controlled. -     Basic metabolic panel with GFR; Future -     CBC with Differential/Platelet; Future  Immunization due -     Pneumococcal polysaccharide vaccine 23-valent greater than or equal to 2yo subcutaneous/IM  Need for immunization against influenza -     Flu vaccine HIGH DOSE PF(Fluzone Trivalent)  Stage 3b chronic kidney disease (HCC)- Will avoid nephrotoxic agents      Follow-up: Return in about 6 months (around 05/11/2025).  Debby Molt, MD "

## 2024-11-10 NOTE — Patient Instructions (Signed)

## 2024-11-18 LAB — VITAMIN B1: Vitamin B1 (Thiamine): 9 nmol/L (ref 8–30)

## 2024-11-18 LAB — RETICULOCYTES
ABS Retic: 46080 {cells}/uL (ref 20000–80000)
Retic Ct Pct: 1.2 %

## 2024-11-18 LAB — ZINC: Zinc: 87 ug/dL (ref 60–130)

## 2024-12-02 ENCOUNTER — Ambulatory Visit: Payer: Medicare Other | Admitting: Family Medicine

## 2024-12-21 NOTE — Patient Instructions (Signed)
Below is our plan:  We will continue donepezil 10mg and memantine 10mg daily  Please make sure you are staying well hydrated. I recommend 50-60 ounces daily. Well balanced diet and regular exercise encouraged. Consistent sleep schedule with 6-8 hours recommended.   Please continue follow up with care team as directed.   Follow up with me in 1 year   You may receive a survey regarding today's visit. I encourage you to leave honest feed back as I do use this information to improve patient care. Thank you for seeing me today!   Management of Memory Problems   There are some general things you can do to help manage your memory problems.  Your memory may not in fact recover, but by using techniques and strategies you will be able to manage your memory difficulties better.   1)  Establish a routine. Try to establish and then stick to a regular routine.  By doing this, you will get used to what to expect and you will reduce the need to rely on your memory.  Also, try to do things at the same time of day, such as taking your medication or checking your calendar first thing in the morning. Think about think that you can do as a part of a regular routine and make a list.  Then enter them into a daily planner to remind you.  This will help you establish a routine.   2)  Organize your environment. Organize your environment so that it is uncluttered.  Decrease visual stimulation.  Place everyday items such as keys or cell phone in the same place every day (ie.  Basket next to front door) Use post it notes with a brief message to yourself (ie. Turn off light, lock the door) Use labels to indicate where things go (ie. Which cupboards are for food, dishes, etc.) Keep a notepad and pen by the telephone to take messages   3)  Memory Aids A diary or journal/notebook/daily planner Making a list (shopping list, chore list, to do list that needs to be done) Using an alarm as a reminder (kitchen timer or cell  phone alarm) Using cell phone to store information (Notes, Calendar, Reminders) Calendar/White board placed in a prominent position Post-it notes   In order for memory aids to be useful, you need to have good habits.  It's no good remembering to make a note in your journal if you don't remember to look in it.  Try setting aside a certain time of day to look in journal.   4)  Improving mood and managing fatigue. There may be other factors that contribute to memory difficulties.  Factors, such as anxiety, depression and tiredness can affect memory. Regular gentle exercise can help improve your mood and give you more energy. Exercise: there are short videos created by the National Institute on Health specially for older adults: https://bit.ly/2I30q97.  Mediterranean diet: which emphasizes fruits, vegetables, whole grains, legumes, fish, and other seafood; unsaturated fats such as olive oils; and low amounts of red meat, eggs, and sweets. A variation of this, called MIND (Mediterranean-DASH Intervention for Neurodegenerative Delay) incorporates the DASH (Dietary Approaches to Stop Hypertension) diet, which has been shown to lower high blood pressure, a risk factor for Alzheimer's disease. More information at: https://www.nia.nih.gov/health/what-do-we-know-about-diet-and-prevention-alzheimers-disease.  Aerobic exercise that improve heart health is also good for the mind.  National Institute on Aging have short videos for exercises that you can do at home: www.nia.nih.gov/Go4Life Simple relaxation techniques may help relieve   symptoms of anxiety Try to get back to completing activities or hobbies you enjoyed doing in the past. Learn to pace yourself through activities to decrease fatigue. Find out about some local support groups where you can share experiences with others. Try and achieve 7-8 hours of sleep at night.   Resources for Family/Caregiver  Online caregiver support groups can be found at  alz.org or call Alzheimer's Association's 24/7 hotline: 800.272.3900. [facility] offers in-person, virtual support groups and individual counseling for both care partners and persons with memory loss. Call for more information at [phone].   Advanced care plan: there are two types of Power of Attorney: healthcare and durable. Healthcare POA is a designated person to make healthcare decisions on your behalf if you were too sick to make them yourself. This person can be selected and documented by your physician. Durable POA has to be set up with a lawyer who takes charge of your finances and estate if you were too sick or cognitively impaired to manage your finances accurately. You can find a local Elder Law lawyer here: https://www.naela.org/.  Check out www.planyourlifespan.org, which will help you plan before a crisis and decide who will take care of life considerations in a circumstance where you may not be able to speak for yourself.   Helpful books (available on Amazon or your local bookstore):  By Dr. Ed Shaw: Keeping Love Alive as Memories Fade: The 5 Love Languages and the Alzheimer's Journey Aug 20, 2015 The Dementia Care Partner's Workbook: A Guide for Understanding, Education, and Hope Paperback - April 19, 2018.  Both available for less than $15.   "Coping with behavior change in dementia: a family caregiver's guide" by Beth Spencer & Laurie White "A Caregiver's Guide to Dementia: Using Activities and Other Strategies to Prevent, Reduce and Manage Behavioral Symptoms" by Laura N. Gitlin and Catherine Piersol.  "Creating Moments of Joy for the Person with Alzheimer's or Dementia" 4th edition by Jolene Brackrey  Caregiver videos on common behaviors related to dementia: https://www.[facility].org/dementia/caregiver-education-videos  [facility] Caregiver Portal: free to sign up, links to local resources: [email or web address]  

## 2024-12-22 ENCOUNTER — Encounter: Payer: Self-pay | Admitting: Family Medicine

## 2024-12-22 ENCOUNTER — Ambulatory Visit: Admitting: Family Medicine

## 2024-12-22 VITALS — BP 112/72 | Ht 66.0 in | Wt 133.5 lb

## 2024-12-22 DIAGNOSIS — F0390 Unspecified dementia without behavioral disturbance: Secondary | ICD-10-CM | POA: Diagnosis not present

## 2024-12-22 DIAGNOSIS — R634 Abnormal weight loss: Secondary | ICD-10-CM | POA: Diagnosis not present

## 2024-12-22 MED ORDER — DONEPEZIL HCL 10 MG PO TABS: 10.0000 mg | ORAL_TABLET | Freq: Every day | ORAL | 3 refills | Status: AC

## 2024-12-22 MED ORDER — MEMANTINE HCL 10 MG PO TABS: 10.0000 mg | ORAL_TABLET | Freq: Two times a day (BID) | ORAL | 3 refills | Status: AC

## 2025-05-11 ENCOUNTER — Ambulatory Visit: Admitting: Internal Medicine

## 2025-07-27 ENCOUNTER — Ambulatory Visit

## 2025-12-27 ENCOUNTER — Ambulatory Visit: Admitting: Family Medicine
